# Patient Record
Sex: Male | Born: 1940 | Hispanic: No | Marital: Married | Smoking: Never smoker
Health system: Southern US, Community
[De-identification: ages and names within clinical notes are randomized; demographics above are authoritative.]

## PROBLEM LIST (undated history)

## (undated) DIAGNOSIS — S22080S Wedge compression fracture of T11-T12 vertebra, sequela: Secondary | ICD-10-CM

## (undated) DIAGNOSIS — D696 Thrombocytopenia, unspecified: Secondary | ICD-10-CM

## (undated) DIAGNOSIS — K439 Ventral hernia without obstruction or gangrene: Secondary | ICD-10-CM

## (undated) DIAGNOSIS — T7840XA Allergy, unspecified, initial encounter: Secondary | ICD-10-CM

## (undated) DIAGNOSIS — N179 Acute kidney failure, unspecified: Secondary | ICD-10-CM

## (undated) DIAGNOSIS — G894 Chronic pain syndrome: Secondary | ICD-10-CM

## (undated) DIAGNOSIS — G4730 Sleep apnea, unspecified: Secondary | ICD-10-CM

## (undated) DIAGNOSIS — K4090 Unilateral inguinal hernia, without obstruction or gangrene, not specified as recurrent: Secondary | ICD-10-CM

## (undated) DIAGNOSIS — N529 Male erectile dysfunction, unspecified: Secondary | ICD-10-CM

## (undated) DIAGNOSIS — M51369 Other intervertebral disc degeneration, lumbar region without mention of lumbar back pain or lower extremity pain: Secondary | ICD-10-CM

## (undated) DIAGNOSIS — I10 Essential (primary) hypertension: Secondary | ICD-10-CM

## (undated) DIAGNOSIS — E785 Hyperlipidemia, unspecified: Secondary | ICD-10-CM

## (undated) DIAGNOSIS — C679 Malignant neoplasm of bladder, unspecified: Secondary | ICD-10-CM

## (undated) DIAGNOSIS — C9000 Multiple myeloma not having achieved remission: Secondary | ICD-10-CM

## (undated) DIAGNOSIS — M1990 Unspecified osteoarthritis, unspecified site: Secondary | ICD-10-CM

## (undated) DIAGNOSIS — R972 Elevated prostate specific antigen [PSA]: Secondary | ICD-10-CM

## (undated) DIAGNOSIS — K219 Gastro-esophageal reflux disease without esophagitis: Secondary | ICD-10-CM

## (undated) DIAGNOSIS — R519 Headache, unspecified: Secondary | ICD-10-CM

## (undated) DIAGNOSIS — M5136 Other intervertebral disc degeneration, lumbar region: Secondary | ICD-10-CM

## (undated) HISTORY — DX: Unspecified osteoarthritis, unspecified site: M19.90

## (undated) HISTORY — DX: Hyperlipidemia, unspecified: E78.5

## (undated) HISTORY — DX: Essential (primary) hypertension: I10

## (undated) HISTORY — DX: Allergy, unspecified, initial encounter: T78.40XA

---

## 2016-07-07 HISTORY — PX: OTHER SURGICAL HISTORY: SHX169

## 2017-02-24 ENCOUNTER — Ambulatory Visit: Payer: No Typology Code available for payment source | Admitting: Podiatry

## 2017-06-23 ENCOUNTER — Ambulatory Visit (INDEPENDENT_AMBULATORY_CARE_PROVIDER_SITE_OTHER): Payer: Non-veteran care

## 2017-06-23 ENCOUNTER — Other Ambulatory Visit: Payer: Self-pay | Admitting: Podiatry

## 2017-06-23 ENCOUNTER — Encounter: Payer: Self-pay | Admitting: Podiatry

## 2017-06-23 ENCOUNTER — Ambulatory Visit (INDEPENDENT_AMBULATORY_CARE_PROVIDER_SITE_OTHER): Payer: Non-veteran care | Admitting: Podiatry

## 2017-06-23 DIAGNOSIS — M659 Synovitis and tenosynovitis, unspecified: Secondary | ICD-10-CM

## 2017-06-23 DIAGNOSIS — M7752 Other enthesopathy of left foot: Secondary | ICD-10-CM

## 2017-06-23 DIAGNOSIS — M7750 Other enthesopathy of unspecified foot: Secondary | ICD-10-CM

## 2017-06-23 DIAGNOSIS — L989 Disorder of the skin and subcutaneous tissue, unspecified: Secondary | ICD-10-CM | POA: Diagnosis not present

## 2017-06-23 DIAGNOSIS — M65971 Unspecified synovitis and tenosynovitis, right ankle and foot: Secondary | ICD-10-CM

## 2017-06-23 DIAGNOSIS — M65972 Unspecified synovitis and tenosynovitis, left ankle and foot: Secondary | ICD-10-CM

## 2017-06-23 NOTE — Progress Notes (Signed)
   Subjective:    Patient ID: Peter Webster, male    DOB: October 05, 1940, 76 y.o.   MRN: [id number]  HPI    Review of Systems     Objective:   Physical Exam        Assessment & Plan:

## 2017-06-25 NOTE — Progress Notes (Signed)
   Subjective: 76 year old male presents to the office today as a new patient with a chief complaint of aching pain in bilateral ankles that has been ongoing for several years. He reports associated throbbing pain in the right foot and painful callus lesions of the left foot. Walking increases the symptoms. He has had the callus trimmed and has worn corn pads for treatment. Patient presents today for further treatment and evaluation.   No past medical history on file.   Objective:  Physical Exam General: Alert and oriented x3 in no acute distress  Dermatology: Hyperkeratotic lesion present on the sub-fifth MPJ of the left foot. Pain on palpation with a central nucleated core noted.  Skin is warm, dry and supple bilateral lower extremities. Negative for open lesions or macerations.  Vascular: Palpable pedal pulses bilaterally. No edema or erythema noted. Capillary refill within normal limits.  Neurological: Epicritic and protective threshold grossly intact bilaterally.   Musculoskeletal Exam: Pain on palpation at the keratotic lesion noted as well as to the left 5th MPJ and to the anterior, medial and lateral aspects of bilateral ankle joints. Range of motion within normal limits bilateral. Muscle strength 5/5 in all groups bilateral.  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  #3 Dressed area with light dressing. #4 Injection of 0.5 mLs Celestone Soluspan injected into bilateral ankle joints. #5 Injection of 0.5 mLs Celestone Soluspan injected into the left 5th MPJ. #6 Patient is to return to the clinic in 6 weeks.   Edrick Kins, DPM [facility]  Dr. Edrick Kins, [place]                                        [place], [place] [address]                Office [phone]  Fax [phone]

## 2017-07-07 HISTORY — PX: COLONOSCOPY: SHX174

## 2017-08-04 ENCOUNTER — Ambulatory Visit: Payer: No Typology Code available for payment source | Admitting: Podiatry

## 2017-08-07 ENCOUNTER — Ambulatory Visit: Payer: No Typology Code available for payment source | Admitting: Podiatry

## 2017-09-01 ENCOUNTER — Ambulatory Visit (INDEPENDENT_AMBULATORY_CARE_PROVIDER_SITE_OTHER): Payer: No Typology Code available for payment source | Admitting: Podiatry

## 2017-09-01 ENCOUNTER — Encounter: Payer: Self-pay | Admitting: Podiatry

## 2017-09-01 DIAGNOSIS — L989 Disorder of the skin and subcutaneous tissue, unspecified: Secondary | ICD-10-CM

## 2017-09-01 DIAGNOSIS — M659 Synovitis and tenosynovitis, unspecified: Secondary | ICD-10-CM | POA: Diagnosis not present

## 2017-09-01 DIAGNOSIS — M7752 Other enthesopathy of left foot: Secondary | ICD-10-CM

## 2017-09-03 NOTE — Progress Notes (Signed)
   Subjective: 77 year old male presents to the office today for follow up evaluation of bilateral ankle pain, a painful callus lesion to the left sub-5th MPJ as well as capsulitis to the left 5th MPJ.  He states the pain has improved but is still present, returning about one week ago. He has been taking Tylenol with minimal relief. Patient presents today for further treatment and evaluation.   No past medical history on file.   Objective:  Physical Exam General: Alert and oriented x3 in no acute distress  Dermatology: Hyperkeratotic lesion present on the sub-fifth MPJ of the left foot. Pain on palpation with a central nucleated core noted.  Skin is warm, dry and supple bilateral lower extremities. Negative for open lesions or macerations.  Vascular: Palpable pedal pulses bilaterally. No edema or erythema noted. Capillary refill within normal limits.  Neurological: Epicritic and protective threshold grossly intact bilaterally.   Musculoskeletal Exam: Pain on palpation at the keratotic lesion noted as well as to the left 5th MPJ and to the anterior, medial and lateral aspects of bilateral ankle joints. Range of motion within normal limits bilateral. Muscle strength 5/5 in all groups bilateral.  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  #3 Dressed area with light dressing. #4 Injection of 0.5 mLs Celestone Soluspan injected into bilateral ankle joints. #5 Injection of 0.5 mLs Celestone Soluspan injected into the left 5th MPJ. #6 Patient is to return to the clinic when necessary.   Edrick Kins, DPM [facility]  Dr. Edrick Kins, [place]                                        [place], [place] [address]                Office [phone]  Fax  [phone]

## 2018-03-12 ENCOUNTER — Encounter: Payer: Self-pay | Admitting: Podiatry

## 2018-03-12 ENCOUNTER — Ambulatory Visit (INDEPENDENT_AMBULATORY_CARE_PROVIDER_SITE_OTHER): Payer: Non-veteran care | Admitting: Podiatry

## 2018-03-12 DIAGNOSIS — L989 Disorder of the skin and subcutaneous tissue, unspecified: Secondary | ICD-10-CM

## 2018-03-12 DIAGNOSIS — M779 Enthesopathy, unspecified: Secondary | ICD-10-CM

## 2018-03-12 DIAGNOSIS — M7752 Other enthesopathy of left foot: Secondary | ICD-10-CM

## 2018-03-12 DIAGNOSIS — M659 Synovitis and tenosynovitis, unspecified: Secondary | ICD-10-CM

## 2018-03-16 NOTE — Progress Notes (Signed)
   Subjective: 77 year old male presents to the office today for follow up evaluation of bilateral ankle pain, a painful callus lesion to the left sub-5th MPJ as well as capsulitis to the left 5th MPJ. He reports continued pain in the ankles and states the callus needs to be trimmed down again. He has been taking Meloxicam prescribed by his PCP. Walking increases the symptoms. Patient is here for further evaluation and treatment.   No past medical history on file.   Objective:  Physical Exam General: Alert and oriented x3 in no acute distress  Dermatology: Hyperkeratotic lesion present on the sub-fifth MPJ of the left foot. Pain on palpation with a central nucleated core noted.  Skin is warm, dry and supple bilateral lower extremities. Negative for open lesions or macerations.  Vascular: Palpable pedal pulses bilaterally. No edema or erythema noted. Capillary refill within normal limits.  Neurological: Epicritic and protective threshold grossly intact bilaterally.   Musculoskeletal Exam: Pain on palpation at the keratotic lesion noted as well as to the left 5th MPJ and to the anterior, medial and lateral aspects of bilateral ankle joints. Range of motion within normal limits bilateral. Muscle strength 5/5 in all groups bilateral.  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  #3 Dressed area with light dressing. #4 Injection of 0.5 mLs Celestone Soluspan injected into bilateral ankle joints. #5 Injection of 0.5 mLs Celestone Soluspan injected into the left 5th MPJ. #6 Continue taking Meloxicam daily prescribed by PCP.  #7 Patient is to return to the clinic when necessary.   Edrick Kins, DPM [facility]  Dr. Edrick Kins, [place]                                         [place], [place] [address]                Office [phone]  Fax [phone]

## 2018-05-03 ENCOUNTER — Encounter

## 2018-05-04 ENCOUNTER — Ambulatory Visit: Payer: Non-veteran care | Admitting: Podiatry

## 2018-05-14 ENCOUNTER — Encounter: Payer: Self-pay | Admitting: Podiatry

## 2018-05-14 ENCOUNTER — Ambulatory Visit (INDEPENDENT_AMBULATORY_CARE_PROVIDER_SITE_OTHER): Payer: Non-veteran care | Admitting: Podiatry

## 2018-05-14 DIAGNOSIS — L989 Disorder of the skin and subcutaneous tissue, unspecified: Secondary | ICD-10-CM

## 2018-05-14 DIAGNOSIS — M659 Synovitis and tenosynovitis, unspecified: Secondary | ICD-10-CM

## 2018-05-14 NOTE — Patient Instructions (Addendum)
Recommend Corn & Callus remover.   Active ingredient : Salicylic acid

## 2018-05-16 NOTE — Progress Notes (Signed)
   Subjective: 77 year old male presents to the office today for follow up evaluation of bilateral ankle pain, a painful callus lesion to the left sub-5th MPJ as well as capsulitis to the left 5th MPJ. He reports the ankle pain is still present and the left is greater than the right. He states the injections provided some temporary relief. Patient is here for further evaluation and treatment.   No past medical history on file.   Objective:  Physical Exam General: Alert and oriented x3 in no acute distress  Dermatology: Hyperkeratotic lesion present on the sub-fifth MPJ of the left foot. Pain on palpation with a central nucleated core noted.  Skin is warm, dry and supple bilateral lower extremities. Negative for open lesions or macerations.  Vascular: Palpable pedal pulses bilaterally. No edema or erythema noted. Capillary refill within normal limits.  Neurological: Epicritic and protective threshold grossly intact bilaterally.   Musculoskeletal Exam: Pain on palpation at the keratotic lesion noted and to the anterior, medial and lateral aspects of bilateral ankle joints. Range of motion within normal limits bilateral. Muscle strength 5/5 in all groups bilateral.  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 Salinocaine applied to area.  #3 Dressed area with light dressing. #4 Injection of 0.5 mLs Celestone Soluspan injected into bilateral ankle joints. #5 Recommended OTC corn and callus remover.  #6 Return to clinic as needed.   Edrick Kins, DPM [facility]  Dr. Edrick Kins, [place]                                        [place], [place] [address]                Office [phone]  Fax [phone]

## 2018-06-15 ENCOUNTER — Encounter: Payer: Self-pay | Admitting: Podiatry

## 2018-06-15 ENCOUNTER — Ambulatory Visit (INDEPENDENT_AMBULATORY_CARE_PROVIDER_SITE_OTHER): Payer: No Typology Code available for payment source | Admitting: Podiatry

## 2018-06-15 DIAGNOSIS — B351 Tinea unguium: Secondary | ICD-10-CM | POA: Diagnosis not present

## 2018-06-15 DIAGNOSIS — M79676 Pain in unspecified toe(s): Secondary | ICD-10-CM | POA: Diagnosis not present

## 2018-06-15 DIAGNOSIS — M659 Synovitis and tenosynovitis, unspecified: Secondary | ICD-10-CM

## 2018-06-15 DIAGNOSIS — L989 Disorder of the skin and subcutaneous tissue, unspecified: Secondary | ICD-10-CM

## 2018-06-16 NOTE — Progress Notes (Signed)
   Subjective: 77 year old male presents to the office today for follow up evaluation of bilateral ankle pain and a painful callus lesion of the sub-fifth MPJ of the left foot. He states the callus needs to be trimmed and he is still experiencing some ankle pain. Walking and bearing weight increases the pain.  He also complains of elongated, thickened nails bilaterally that cause pain while ambulating in shoes. He is unable to trim his own nails. Patient is here for further evaluation and treatment.   No past medical history on file.   Objective:  Physical Exam General: Alert and oriented x3 in no acute distress  Dermatology: Hyperkeratotic lesion present on the sub-fifth MPJ of the left foot. Pain on palpation with a central nucleated core noted. Nails are tender, long, thickened and dystrophic with subungual debris, consistent with onychomycosis, 1-5 bilateral. No signs of infection noted. Skin is warm, dry and supple bilateral lower extremities. Negative for open lesions or macerations.  Vascular: Palpable pedal pulses bilaterally. No edema or erythema noted. Capillary refill within normal limits.  Neurological: Epicritic and protective threshold grossly intact bilaterally.   Musculoskeletal Exam: Pain on palpation at the keratotic lesion noted and to the anterior, medial and lateral aspects of bilateral ankle joints. Range of motion within normal limits bilateral. Muscle strength 5/5 in all groups bilateral.  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 Salinocaine applied to area.  #3 Dressed area with light dressing. #4 Injection of 0.5 mLs Celestone Soluspan injected into bilateral  ankle joints. #5 Mechanical debridement of nails 1-5 bilaterally performed using a nail nipper. Filed with dremel without incident.  #6 Return to clinic in 3 months.    Edrick Kins, DPM [facility]  Dr. Edrick Kins, [place]                                        [place], [place] [address]                Office [phone]  Fax [phone]

## 2018-07-01 ENCOUNTER — Telehealth: Payer: Self-pay | Admitting: Podiatry

## 2018-07-02 NOTE — Telephone Encounter (Signed)
[facility] called requesting notes for podiatry appointment. Please contact us at  [phone].

## 2018-08-17 ENCOUNTER — Ambulatory Visit (INDEPENDENT_AMBULATORY_CARE_PROVIDER_SITE_OTHER): Payer: No Typology Code available for payment source | Admitting: Podiatry

## 2018-08-17 ENCOUNTER — Encounter: Payer: Self-pay | Admitting: Podiatry

## 2018-08-17 DIAGNOSIS — M659 Synovitis and tenosynovitis, unspecified: Secondary | ICD-10-CM

## 2018-08-17 DIAGNOSIS — M65972 Unspecified synovitis and tenosynovitis, left ankle and foot: Secondary | ICD-10-CM

## 2018-08-17 DIAGNOSIS — B351 Tinea unguium: Secondary | ICD-10-CM

## 2018-08-17 DIAGNOSIS — L989 Disorder of the skin and subcutaneous tissue, unspecified: Secondary | ICD-10-CM | POA: Diagnosis not present

## 2018-08-17 DIAGNOSIS — M65971 Unspecified synovitis and tenosynovitis, right ankle and foot: Secondary | ICD-10-CM

## 2018-08-17 DIAGNOSIS — M79676 Pain in unspecified toe(s): Secondary | ICD-10-CM

## 2018-08-23 NOTE — Progress Notes (Signed)
   Subjective: 78 year old male presents to the office today for follow up evaluation of bilateral ankle pain and a painful callus lesion of the sub-fifth MPJ of the left foot. He states the callus needs to be trimmed and he is still experiencing some ankle pain. Walking and bearing weight increases the pain.  He also complains of elongated, thickened nails bilaterally that cause pain while ambulating in shoes. He is unable to trim his own nails. Patient is here for further evaluation and treatment.   History reviewed. No pertinent past medical history.   Objective:  Physical Exam General: Alert and oriented x3 in no acute distress  Dermatology: Hyperkeratotic lesion present on the sub-fifth MPJ of the left foot. Pain on palpation with a central nucleated core noted. Nails are tender, long, thickened and dystrophic with subungual debris, consistent with onychomycosis, 1-5 bilateral. No signs of infection noted. Skin is warm, dry and supple bilateral lower extremities. Negative for open lesions or macerations.  Vascular: Palpable pedal pulses bilaterally. No edema or erythema noted. Capillary refill within normal limits.  Neurological: Epicritic and protective threshold grossly intact bilaterally.   Musculoskeletal Exam: Pain on palpation at the keratotic lesion noted and to the anterior, medial and lateral aspects of bilateral ankle joints. Range of motion within normal limits bilateral. Muscle strength 5/5 in all groups bilateral.  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 Salinocaine applied to area.  #3 Dressed area with light dressing. #4 Injection of 0.5 mLs Celestone Soluspan  injected into bilateral ankle joints. #5 Mechanical debridement of nails 1-5 bilaterally performed using a nail nipper. Filed with dremel without incident.  #6 Return to clinic in 3 months.    Edrick Kins, DPM [facility]  Dr. Edrick Kins, [place]                                        [place], [place] [address]                Office [phone]  Fax [phone]

## 2018-11-19 ENCOUNTER — Other Ambulatory Visit: Payer: Self-pay

## 2018-11-19 ENCOUNTER — Ambulatory Visit (INDEPENDENT_AMBULATORY_CARE_PROVIDER_SITE_OTHER): Payer: No Typology Code available for payment source | Admitting: Podiatry

## 2018-11-19 ENCOUNTER — Encounter: Payer: Self-pay | Admitting: Podiatry

## 2018-11-19 VITALS — Temp 97.7°F

## 2018-11-19 DIAGNOSIS — L989 Disorder of the skin and subcutaneous tissue, unspecified: Secondary | ICD-10-CM

## 2018-11-19 DIAGNOSIS — B351 Tinea unguium: Secondary | ICD-10-CM | POA: Diagnosis not present

## 2018-11-19 DIAGNOSIS — M79676 Pain in unspecified toe(s): Secondary | ICD-10-CM | POA: Diagnosis not present

## 2018-11-19 DIAGNOSIS — M65171 Other infective (teno)synovitis, right ankle and foot: Secondary | ICD-10-CM | POA: Diagnosis not present

## 2018-11-19 DIAGNOSIS — M65972 Unspecified synovitis and tenosynovitis, left ankle and foot: Secondary | ICD-10-CM

## 2018-11-19 DIAGNOSIS — M65172 Other infective (teno)synovitis, left ankle and foot: Secondary | ICD-10-CM

## 2018-11-19 DIAGNOSIS — M659 Synovitis and tenosynovitis, unspecified: Secondary | ICD-10-CM

## 2018-11-19 DIAGNOSIS — M65971 Unspecified synovitis and tenosynovitis, right ankle and foot: Secondary | ICD-10-CM

## 2018-11-22 NOTE — Progress Notes (Signed)
   Subjective: 78 year old male presents to the office today for follow up evaluation of bilateral ankle pain and a painful callus lesion of the sub-fifth MPJ of the left foot. He states the callus needs to be trimmed and he is still experiencing some ankle pain. Walking and bearing weight increases the pain. He is requesting injections for treatment since that seemed to provided some relief in the past.  He also complains of elongated, thickened nails bilaterally that cause pain while ambulating in shoes. He is unable to trim his own nails. Patient is here for further evaluation and treatment.   No past medical history on file.   Objective:  Physical Exam General: Alert and oriented x3 in no acute distress  Dermatology: Hyperkeratotic lesion present on the sub-fifth MPJ of the left foot. Pain on palpation with a central nucleated core noted. Nails are tender, long, thickened and dystrophic with subungual debris, consistent with onychomycosis, 1-5 bilateral. No signs of infection noted. Skin is warm, dry and supple bilateral lower extremities. Negative for open lesions or macerations.  Vascular: Palpable pedal pulses bilaterally. No edema or erythema noted. Capillary refill within normal limits.  Neurological: Epicritic and protective threshold grossly intact bilaterally.   Musculoskeletal Exam: Pain on palpation at the keratotic lesion noted and to the anterior, medial and lateral aspects of bilateral ankle joints. Range of motion within normal limits bilateral. Muscle strength 5/5 in all groups bilateral.  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   #3 Dressed area with light dressing.  #4 Injection of 0.5 mLs Celestone Soluspan injected into bilateral ankle joints. #5 Mechanical debridement of nails 1-5 bilaterally performed using a nail nipper. Filed with dremel without incident.  #6 Prescription for custom orthotics provided to patient to take to [place].  #7 Return to clinic in 3 months.    Edrick Kins, DPM [facility]  Dr. Edrick Kins, [place]                                        [place], [place] [address]                Office [phone]  Fax [phone]

## 2019-01-20 ENCOUNTER — Other Ambulatory Visit: Payer: Self-pay | Admitting: *Deleted

## 2019-01-20 DIAGNOSIS — Z20822 Contact with and (suspected) exposure to covid-19: Secondary | ICD-10-CM

## 2019-01-21 ENCOUNTER — Ambulatory Visit: Payer: No Typology Code available for payment source | Admitting: Podiatry

## 2019-01-25 LAB — NOVEL CORONAVIRUS, NAA: SARS-CoV-2, NAA: NOT DETECTED

## 2019-02-11 ENCOUNTER — Other Ambulatory Visit: Payer: Self-pay | Admitting: Podiatry

## 2019-02-11 ENCOUNTER — Other Ambulatory Visit: Payer: Self-pay

## 2019-02-11 ENCOUNTER — Ambulatory Visit (INDEPENDENT_AMBULATORY_CARE_PROVIDER_SITE_OTHER): Payer: No Typology Code available for payment source | Admitting: Podiatry

## 2019-02-11 ENCOUNTER — Ambulatory Visit (INDEPENDENT_AMBULATORY_CARE_PROVIDER_SITE_OTHER): Payer: No Typology Code available for payment source

## 2019-02-11 ENCOUNTER — Encounter: Payer: Self-pay | Admitting: Podiatry

## 2019-02-11 VITALS — Temp 98.4°F

## 2019-02-11 DIAGNOSIS — L989 Disorder of the skin and subcutaneous tissue, unspecified: Secondary | ICD-10-CM | POA: Diagnosis not present

## 2019-02-11 DIAGNOSIS — M659 Synovitis and tenosynovitis, unspecified: Secondary | ICD-10-CM | POA: Diagnosis not present

## 2019-02-11 DIAGNOSIS — M21622 Bunionette of left foot: Secondary | ICD-10-CM

## 2019-02-11 DIAGNOSIS — M79676 Pain in unspecified toe(s): Secondary | ICD-10-CM

## 2019-02-11 DIAGNOSIS — B351 Tinea unguium: Secondary | ICD-10-CM | POA: Diagnosis not present

## 2019-02-13 NOTE — Progress Notes (Signed)
   Subjective: 78 year old male presents to the office today for follow up evaluation of bilateral ankle pain and a painful callus lesion of the sub-fifth MPJ of the left foot. He states the callus needs to be trimmed and he is still experiencing some ankle pain. Walking and bearing weight increases the pain. He states the injections helped to alleviate his pain so he would like them again.   He also complains of elongated, thickened nails bilaterally that cause pain while ambulating in shoes. He is unable to trim his own nails. Patient is here for further evaluation and treatment.   No past medical history on file.   Objective:  Physical Exam General: Alert and oriented x3 in no acute distress  Dermatology: Hyperkeratotic lesion present on the sub-fifth MPJ of the left foot. Pain on palpation with a central nucleated core noted. Nails are tender, long, thickened and dystrophic with subungual debris, consistent with onychomycosis, 1-5 bilateral. No signs of infection noted. Skin is warm, dry and supple bilateral lower extremities. Negative for open lesions or macerations.  Vascular: Palpable pedal pulses bilaterally. No edema or erythema noted. Capillary refill within normal limits.  Neurological: Epicritic and protective threshold grossly intact bilaterally.   Musculoskeletal Exam: Pain on palpation at the keratotic lesion noted and to the anterior, medial and lateral aspects of bilateral ankle joints.  Clinical evidence of Tailor's bunion deformity noted to the respective foot. There is a moderate pain on palpation range of motion of the fifth MPJ. Range of motion within normal limits bilateral. Muscle strength 5/5 in all groups bilateral.  Radiographic Exam: Increased intermetatarsal angle to the fourth interspace of the respective foot. Prominent fifth metatarsal head. Joint spaces preserved.   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 X-Rays reviewed.  #2 Excisional debridement of keratoic lesion using a chisel blade was performed without incident.   #3 Dressed area with light dressing. #4 Injection of 0.5 mLs Celestone Soluspan injected into bilateral ankle joints. #5 Mechanical debridement of nails 1-5 bilaterally performed using a nail nipper. Filed with dremel without incident.  #6 Today we discussed the conservative versus surgical management of the presenting pathology. The patient opts for surgical management. All possible complications and details of the procedure were explained. All patient questions were answered. No guarantees were expressed or implied. #7 Authorization for surgery was initiated today. Surgery will consist of Tailor's bunionectomy with osteotomy.  #8 Return to clinic one week post op.     Edrick Kins, DPM [facility]  Dr. Edrick Kins, [place]                                        [place], [place] [address]                Office [phone]  Fax [phone]

## 2019-02-14 ENCOUNTER — Telehealth: Payer: Self-pay | Admitting: *Deleted

## 2019-02-14 ENCOUNTER — Encounter: Payer: Self-pay | Admitting: Podiatry

## 2019-02-14 NOTE — Telephone Encounter (Signed)
"  I had a consult with Peter Webster a couple of days ago.  I was supposed to have a surgery done with Dr. Amalia Hailey.  I am calling to set up an appointment for next month."

## 2019-02-15 NOTE — Telephone Encounter (Signed)
I attempted to call the patient.  I left him a message to call me back on tomorrow.

## 2019-02-16 NOTE — Telephone Encounter (Signed)
I'm returning your call.  You want to schedule your surgery with Dr. Amalia Hailey?  "Yes, I'd like to schedule it for September 24."  Dr. Amalia Hailey doesn't have anything available on that date.  His next available date is April 14, 2019.  "Okay, put me down for then.  What time?"  Someone from the surgical center will call you a day or two prior to your surgery date and will give you your arrival time.

## 2019-02-21 ENCOUNTER — Telehealth: Payer: Self-pay | Admitting: *Deleted

## 2019-02-21 NOTE — Telephone Encounter (Signed)
"  I spoke to you last week about scheduling my appointment for surgery in October.  I want to see if I can reschedule it to November."  Dr. Amalia Hailey can do it on May 12, 2019.  "Perfect, that date will be fine."  I'll get it rescheduled.

## 2019-04-01 ENCOUNTER — Ambulatory Visit (INDEPENDENT_AMBULATORY_CARE_PROVIDER_SITE_OTHER): Payer: No Typology Code available for payment source | Admitting: Podiatry

## 2019-04-01 ENCOUNTER — Other Ambulatory Visit: Payer: Self-pay

## 2019-04-01 DIAGNOSIS — M65972 Unspecified synovitis and tenosynovitis, left ankle and foot: Secondary | ICD-10-CM

## 2019-04-01 DIAGNOSIS — B351 Tinea unguium: Secondary | ICD-10-CM | POA: Diagnosis not present

## 2019-04-01 DIAGNOSIS — M21622 Bunionette of left foot: Secondary | ICD-10-CM | POA: Diagnosis not present

## 2019-04-01 DIAGNOSIS — L989 Disorder of the skin and subcutaneous tissue, unspecified: Secondary | ICD-10-CM | POA: Diagnosis not present

## 2019-04-01 DIAGNOSIS — M659 Synovitis and tenosynovitis, unspecified: Secondary | ICD-10-CM | POA: Diagnosis not present

## 2019-04-01 DIAGNOSIS — M79676 Pain in unspecified toe(s): Secondary | ICD-10-CM

## 2019-04-01 DIAGNOSIS — M65971 Unspecified synovitis and tenosynovitis, right ankle and foot: Secondary | ICD-10-CM

## 2019-04-04 NOTE — Progress Notes (Signed)
   Subjective: 78 year old male presents to the office today for follow up evaluation of bilateral ankle pain, a painful callus lesion of the sub-fifth MPJ and a Tailor's bunion of the left foot. He reports continued pain, especially in the left foot. He is interested in surgical intervention of the Tailor's bunion. He also reports continued pain secondary to the callus lesion of the left foot. Walking and wearing shoes increases his pain. He states the injections helped alleviate the pain in his ankles. Patient is here for further evaluation and treatment.   No past medical history on file.   Objective:  Physical Exam General: Alert and oriented x3 in no acute distress  Dermatology: Hyperkeratotic lesion present on the sub-fifth MPJ of the left foot. Pain on palpation with a central nucleated core noted. Skin is warm, dry and supple bilateral lower extremities. Negative for open lesions or macerations.  Vascular: Palpable pedal pulses bilaterally. No edema or erythema noted. Capillary refill within normal limits.  Neurological: Epicritic and protective threshold grossly intact bilaterally.   Musculoskeletal Exam: Pain on palpation at the keratotic lesion noted and to the anterior, medial and lateral aspects of bilateral ankle joints.  Clinical evidence of Tailor's bunion deformity noted to the respective foot. There is a moderate pain on palpation range of motion of the fifth MPJ. Range of motion within normal limits bilateral. Muscle strength 5/5 in all groups bilateral.  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 #2 Excisional debridement of keratoic lesion using a chisel blade was performed without incident.   #3 Dressed area with light dressing. #4 Injection of 0.5 mLs Celestone Soluspan injected into bilateral ankle joints. #5 Surgery for Tailor's bunion scheduled for  November.  #6 Return to clinic one week post op.     Edrick Kins, DPM [facility]  Dr. Edrick Kins, [place]                                        [place], [place] [address]                Office [phone]  Fax [phone]

## 2019-04-21 ENCOUNTER — Telehealth: Payer: Self-pay | Admitting: *Deleted

## 2019-04-21 NOTE — Telephone Encounter (Signed)
I attempted to call the patient again.  I left another message for him to give me a call.

## 2019-04-21 NOTE — Telephone Encounter (Addendum)
I attempted to call the patient to inform him that his referral expired on April 06, 2019.  I also informed him that we may have to reschedule his surgery, 05/12/2019, because we do not have authorization for his surgery.  The procedure code is [phone] and it is not on the referral.  I informed him that he needs to contact his primary care physician (PCP) and see if he/she will authorize his surgery.  I asked him to give me a call back.

## 2019-04-22 NOTE — Telephone Encounter (Signed)
"  I am returning your call."  We need an updated referral for you.  The one we have is expired and it doesn't have the surgical code on it.  The cpt code is [phone].  It must be included on the referral.  "I just spoke to them and they said everything was okay.  They asked me some questions and they said that was all they needed."  I'll see if we have an updated referral and I'll let you know.   I called and left a message informing Peter Webster that we do not have an updated referral.  The one we have is expired and it does not include the surgical cpt code which is [phone].

## 2019-04-29 NOTE — Telephone Encounter (Signed)
"  I'm calling to see if you got the okay for my surgery."  I have not seen anything come in from the [place].  Did you contact them?  "No, I have not done anything.  They informed me that the patient must contact his Primary Care doctor in order to get any updates to his (her) referral.  "Let me call them and see what I can do."  Do you still have the procedure code I gave you?  "No, what is it?"  Your procedure code is [phone], that code must be on the referral.  "I'll give them a call."

## 2019-05-04 NOTE — Telephone Encounter (Signed)
I just got off the phone with the [place].  They said that my surgery was already authorized on my referral."  The referral we have does not have the surgery cpt code of 528[address] on it.  "Well is there anyway you can fax them that information so we can proceed with my surgery next week.?  I can give you the fax number."  Yes, I can send them the information.  What's the fax number?  "It is [phone] and his name is Dr. Lucille Passy."  I'll fax the information to Dr. Waldemar Dickens.   As requested by the patient, I faxed the needed information to Dr. Waldemar Dickens.

## 2019-05-10 NOTE — Telephone Encounter (Signed)
I am calling to let you know that we have not heard anything from the [place] about the authorization of your surgery.  "Well I talked to them and they said they have not received any clinical notes from you all since I've been coming there.  That's a hold up right there."  I sent them some information the last time that I talked to you.  "Well hopefully we'll hear something soon.  I'll follow up on it."  I faxed the clinicals to Dr. Waldemar Dickens.

## 2019-05-11 NOTE — Telephone Encounter (Signed)
"  I assume you haven't heard anything from the [place], so my surgery has been canceled."  Yes, that is correct.  We must cancel your surgery for tomorrow.  "Okay, we'll see how it plays out.  Hopefully they will respond soon."

## 2019-05-17 ENCOUNTER — Telehealth: Payer: Self-pay | Admitting: *Deleted

## 2019-05-17 NOTE — Telephone Encounter (Signed)
"  This is the [facility] regarding a CPT code question for [employer].  If you could, call back."  I attempted to call him back.  He didn't leave his name.  I left a message requesting a return call.

## 2019-05-18 NOTE — Telephone Encounter (Signed)
I attempted to call the [place] again.  I left a message for a return call.

## 2019-06-21 ENCOUNTER — Encounter: Payer: Self-pay | Admitting: Podiatry

## 2019-06-21 ENCOUNTER — Other Ambulatory Visit: Payer: Self-pay

## 2019-06-21 ENCOUNTER — Ambulatory Visit (INDEPENDENT_AMBULATORY_CARE_PROVIDER_SITE_OTHER): Payer: No Typology Code available for payment source | Admitting: Podiatry

## 2019-06-21 DIAGNOSIS — B351 Tinea unguium: Secondary | ICD-10-CM

## 2019-06-21 DIAGNOSIS — M79676 Pain in unspecified toe(s): Secondary | ICD-10-CM

## 2019-06-21 DIAGNOSIS — L989 Disorder of the skin and subcutaneous tissue, unspecified: Secondary | ICD-10-CM | POA: Diagnosis not present

## 2019-06-21 DIAGNOSIS — M659 Synovitis and tenosynovitis, unspecified: Secondary | ICD-10-CM

## 2019-06-24 NOTE — Progress Notes (Signed)
   Subjective: 78 year old male presents to the office today for follow up evaluation of bilateral ankle pain, a painful callus lesion of the sub-fifth MPJ and a Tailor's bunion of the left foot. He reports continued pain of the callus lesion of the left foot. He also notes continued pain of the ankles and would like injections since they helped relieve the pain in the past. Being on his feet increases the pain. Patient is here for further evaluation and treatment.   No past medical history on file.   Objective:  Physical Exam General: Alert and oriented x3 in no acute distress  Dermatology: Hyperkeratotic lesion present on the sub-fifth MPJ of the left foot. Pain on palpation with a central nucleated core noted. Skin is warm, dry and supple bilateral lower extremities. Negative for open lesions or macerations.  Vascular: Palpable pedal pulses bilaterally. No edema or erythema noted. Capillary refill within normal limits.  Neurological: Epicritic and protective threshold grossly intact bilaterally.   Musculoskeletal Exam: Pain on palpation at the keratotic lesion noted and to the anterior, medial and lateral aspects of bilateral ankle joints.  Clinical evidence of Tailor's bunion deformity noted to the respective foot. There is a moderate pain on palpation range of motion of the fifth MPJ. Range of motion within normal limits bilateral. Muscle strength 5/5 in all groups bilateral.  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 #2 Excisional debridement of keratoic lesion using a chisel blade was performed without incident.   #3 Dressed area with light dressing. #4 Injection of 0.5 mLs Celestone Soluspan injected into bilateral ankle joints. #5 Surgery for Tailor's bunion is pending [facility] approval.  #6 Return to clinic in three months.  Wife's birthday is [birth year] 10.      Edrick Kins, DPM [facility]  Dr. Edrick Kins, [place]                                        [place], [place] [address]                Office [phone]  Fax [phone]

## 2019-09-20 ENCOUNTER — Ambulatory Visit: Payer: No Typology Code available for payment source | Admitting: Podiatry

## 2019-09-27 ENCOUNTER — Ambulatory Visit (INDEPENDENT_AMBULATORY_CARE_PROVIDER_SITE_OTHER): Payer: No Typology Code available for payment source | Admitting: Podiatry

## 2019-09-27 ENCOUNTER — Other Ambulatory Visit: Payer: Self-pay

## 2019-09-27 ENCOUNTER — Encounter: Payer: Self-pay | Admitting: Podiatry

## 2019-09-27 VITALS — Temp 97.9°F

## 2019-09-27 DIAGNOSIS — B351 Tinea unguium: Secondary | ICD-10-CM

## 2019-09-27 DIAGNOSIS — L989 Disorder of the skin and subcutaneous tissue, unspecified: Secondary | ICD-10-CM

## 2019-09-27 DIAGNOSIS — M659 Synovitis and tenosynovitis, unspecified: Secondary | ICD-10-CM

## 2019-09-27 DIAGNOSIS — M79676 Pain in unspecified toe(s): Secondary | ICD-10-CM | POA: Diagnosis not present

## 2019-09-29 NOTE — Progress Notes (Signed)
   Subjective: 79 year old male presents to the office today for follow up evaluation of bilateral ankle pain, a painful callus lesion of the sub-fifth MPJ and a Tailor's bunion of the left foot. He reports continued pain and requests injections for treatment of ankle pain since that has helped in the past. He also complains of elongated, thickened nails that cause pain while ambulating in shoes. He is unable to trim his own nails. Patient is here for further evaluation and treatment.   No past medical history on file.   Objective:  Physical Exam General: Alert and oriented x3 in no acute distress  Dermatology: Hyperkeratotic lesion present on the sub-fifth MPJ of the left foot. Pain on palpation with a central nucleated core noted. Nails are tender, long, thickened and dystrophic with subungual debris, consistent with onychomycosis, 1-5 bilateral. No signs of infection noted. Skin is warm, dry and supple bilateral lower extremities. Negative for open lesions or macerations.  Vascular: Palpable pedal pulses bilaterally. No edema or erythema noted. Capillary refill within normal limits.  Neurological: Epicritic and protective threshold grossly intact bilaterally.   Musculoskeletal Exam: Pain on palpation at the keratotic lesion noted and to the anterior, medial and lateral aspects of bilateral ankle joints.  Clinical evidence of Tailor's bunion deformity noted to the respective foot. There is a moderate pain on palpation range of motion of the fifth MPJ. Range of motion within normal limits bilateral. Muscle strength 5/5 in all groups bilateral.  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 #2 Excisional debridement of keratoic lesion using a chisel blade was  performed without incident.   #3 Dressed area with light dressing. #4 Mechanical debridement of nails 1-5 bilaterally performed using a nail nipper. Filed with dremel without incident.  #5 patient has papers from [place] approving surgery. Surgery will consist of Tailor's bunionectomy with 5th metatarsal osteotomy left.  #6 Return to clinic one week post op.   Wife's birthday is [birth year].      Edrick Kins, DPM [facility]  Dr. Edrick Kins, [place]                                        [place], [place] [address]                Office [phone]  Fax [phone]

## 2019-10-18 ENCOUNTER — Ambulatory Visit (INDEPENDENT_AMBULATORY_CARE_PROVIDER_SITE_OTHER): Payer: No Typology Code available for payment source | Admitting: Podiatry

## 2019-10-18 ENCOUNTER — Encounter: Payer: Self-pay | Admitting: Podiatry

## 2019-10-18 ENCOUNTER — Other Ambulatory Visit: Payer: Self-pay

## 2019-10-18 VITALS — Temp 97.3°F

## 2019-10-18 DIAGNOSIS — M722 Plantar fascial fibromatosis: Secondary | ICD-10-CM | POA: Diagnosis not present

## 2019-10-18 DIAGNOSIS — L989 Disorder of the skin and subcutaneous tissue, unspecified: Secondary | ICD-10-CM

## 2019-10-18 DIAGNOSIS — M21622 Bunionette of left foot: Secondary | ICD-10-CM

## 2019-10-18 NOTE — Patient Instructions (Signed)
Pre-Operative Instructions  Congratulations, you have decided to take an important step towards improving your quality of life.  You can be assured that the doctors and staff at [facility] will be with you every step of the way.  Here are some important things you should know:  1. Plan to be at the surgery center/hospital at least 1 (one) hour prior to your scheduled time, unless otherwise directed by the surgical center/hospital staff.  You must have a responsible adult accompany you, remain during the surgery and drive you home.  Make sure you have directions to the surgical center/hospital to ensure you arrive on time. 2. If you are having surgery at [facility] or [facility] hospitals, you will need a copy of your medical history and physical form from your family physician within one month prior to the date of surgery. We will give you a form for your primary physician to complete.  3. We make every effort to accommodate the date you request for surgery.  However, there are times where surgery dates or times have to be moved.  We will contact you as soon as possible if a change in schedule is required.   4. No aspirin/ibuprofen for one week before surgery.  If you are on aspirin, any non-steroidal anti-inflammatory medications (Mobic, Aleve, Ibuprofen) should not be taken seven (7) days prior to your surgery.  You make take Tylenol for pain prior to surgery.  5. Medications - If you are taking daily heart and blood pressure medications, seizure, reflux, allergy, asthma, anxiety, pain or diabetes medications, make sure you notify the surgery center/hospital before the day of surgery so they can tell you which medications you should take or avoid the day of surgery. 6. No food or drink after midnight the night before surgery unless directed otherwise by surgical center/hospital staff. 7. No alcoholic beverages 24-hours prior to surgery.  No smoking 24-hours prior or 24-hours after  surgery. 8. Wear loose pants or shorts. They should be loose enough to fit over bandages, boots, and casts. 9. Don't wear slip-on shoes. Sneakers are preferred. 10. Bring your boot with you to the surgery center/hospital.  Also bring crutches or a walker if your physician has prescribed it for you.  If you do not have this equipment, it will be provided for you after surgery. 11. If you have not been contacted by the surgery center/hospital by the day before your surgery, call to confirm the date and time of your surgery. 12. Leave-time from work may vary depending on the type of surgery you have.  Appropriate arrangements should be made prior to surgery with your employer. 13. Prescriptions will be provided immediately following surgery by your doctor.  Fill these as soon as possible after surgery and take the medication as directed. Pain medications will not be refilled on weekends and must be approved by the doctor. 14. Remove nail polish on the operative foot and avoid getting pedicures prior to surgery. 15. Wash the night before surgery.  The night before surgery wash the foot and leg well with water and the antibacterial soap provided. Be sure to pay special attention to beneath the toenails and in between the toes.  Wash for at least three (3) minutes. Rinse thoroughly with water and dry well with a towel.  Perform this wash unless told not to do so by your physician.  Enclosed: 1 Ice pack (please put in freezer the night before surgery)   1 Hibiclens skin cleaner     Pre-op instructions  If you have any questions regarding the instructions, please do not hesitate to call our office.  [place]: [address], [place], [place] [address] -- [phone]  [place]: [address] [place], [place] [address] -- [phone]  [place]: [address], , [place] [address] -- [phone]   Website: [email or web address] 

## 2019-10-24 NOTE — Progress Notes (Signed)
   Subjective: 79 year old male presents to the office today for follow up evaluation of bilateral ankle pain, a painful callus lesion of the sub-fifth MPJ and a Tailor's bunion of the left foot. He now reports intermittent sharp pain to the left heel that began 3-4 weeks ago. He states the pain is worse when standing after being seated for a long period of time. Walking excessively also increases the pain. He has been taking Tylenol for treatment of all his pain. Patient is here for further evaluation and treatment.   No past medical history on file.   Objective:  Physical Exam General: Alert and oriented x3 in no acute distress  Dermatology: Hyperkeratotic lesion present on the sub-fifth MPJ of the left foot. Pain on palpation with a central nucleated core noted. Skin is warm, dry and supple bilateral lower extremities. Negative for open lesions or macerations.  Vascular: Palpable pedal pulses bilaterally. No edema or erythema noted. Capillary refill within normal limits.  Neurological: Epicritic and protective threshold grossly intact bilaterally.   Musculoskeletal Exam: Pain on palpation at the keratotic lesion noted and to the anterior, medial and lateral aspects of bilateral ankle joints.  Clinical evidence of Tailor's bunion deformity noted to the respective foot. There is a moderate pain on palpation range of motion of the fifth MPJ. Pain with palpation noted to the left heel along the plantar fascia.  Range of motion within normal limits bilateral. Muscle strength 5/5 in all groups bilateral.  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 #2 Excisional debridement of keratoic lesion using a chisel blade was performed without incident.   #3 Dressed area with light dressing. #4 Injection of 0.5 mLs Celestone Soluspan injected into the left heel.    #5 Today we discussed the conservative versus surgical management of the presenting pathology. The patient opts for surgical management. All possible complications and details of the procedure were explained. All patient questions were answered. No guarantees were expressed or implied. #6 Authorization for surgery was re-initiated today. Surgery will consist of Tailor's bunionectomy with 5th metatarsal osteotomy left.  #7 Return to clinic one week post op.     Wife's birthday is [birth year].      Edrick Kins, DPM [facility]  Dr. Edrick Kins, [place]                                        [place], [place] [address]                Office [phone]  Fax [phone]

## 2019-12-09 ENCOUNTER — Encounter: Payer: No Typology Code available for payment source | Admitting: Podiatry

## 2019-12-16 ENCOUNTER — Encounter: Payer: No Typology Code available for payment source | Admitting: Podiatry

## 2019-12-30 ENCOUNTER — Encounter: Payer: No Typology Code available for payment source | Admitting: Podiatry

## 2020-01-11 ENCOUNTER — Telehealth: Payer: Self-pay

## 2020-01-11 NOTE — Telephone Encounter (Signed)
Left message for Peter Webster that I cancelled his surgery on 01/12/20. We were notified by the [facility] that they will not authorize surgery until he sees his pcp with the [facility] [place]. Notified Renee at [employer].

## 2020-01-20 ENCOUNTER — Encounter: Payer: No Typology Code available for payment source | Admitting: Podiatry

## 2020-01-26 ENCOUNTER — Encounter: Payer: No Typology Code available for payment source | Admitting: Podiatry

## 2020-02-10 ENCOUNTER — Encounter: Payer: No Typology Code available for payment source | Admitting: Podiatry

## 2021-06-04 ENCOUNTER — Ambulatory Visit: Payer: No Typology Code available for payment source | Admitting: Surgery

## 2021-06-13 ENCOUNTER — Other Ambulatory Visit: Payer: Self-pay

## 2021-06-13 ENCOUNTER — Ambulatory Visit (INDEPENDENT_AMBULATORY_CARE_PROVIDER_SITE_OTHER): Payer: No Typology Code available for payment source | Admitting: Surgery

## 2021-06-13 ENCOUNTER — Encounter: Payer: Self-pay | Admitting: Surgery

## 2021-06-13 VITALS — BP 129/83 | HR 106 | Temp 98.7°F | Ht 71.0 in | Wt 229.0 lb

## 2021-06-13 DIAGNOSIS — K4090 Unilateral inguinal hernia, without obstruction or gangrene, not specified as recurrent: Secondary | ICD-10-CM | POA: Diagnosis not present

## 2021-06-13 NOTE — Patient Instructions (Addendum)
[facility] [phone] [address]  [place].    Our surgery scheduler will call you within 24-48 hours to schedule your surgery. Please have the blue surgery sheet available when speaking with her.      Hernia, Adult   A hernia is the bulging of an organ or tissue through a weak spot in the muscles of the abdomen. Hernias develop most often near the belly button (navel) or the area where the leg meets the lower abdomen (groin). Common types of hernias include: Incisional hernia. This type bulges through a scar from an abdominal surgery. Umbilical hernia. This type develops near the navel. Inguinal hernia. This type develops in the groin or scrotum. Femoral hernia. This type develops below the groin, in the upper thigh area. Hiatal hernia. This type occurs when part of the stomach slides above the muscle that separates the abdomen from the chest (diaphragm). What are the causes? This condition may be caused by: Heavy lifting. Coughing over a long period of time. Straining to have a bowel movement. Constipation can lead to straining. An incision made during abdominal surgery. A physical problem that is present at birth (congenital defect). Being overweight or obese. Smoking. Excess fluid in the abdomen. Undescended testicles in males. What are the signs or symptoms? The main symptom is a skin-colored, rounded bulge in the area of the hernia. However, a bulge may not always be present. It may grow bigger or be more visible when you cough or strain (such as when lifting something heavy). A hernia that can be pushed back into the abdomen (is reducible) rarely causes pain. A hernia that cannot be pushed back into the abdomen (is incarcerated) may lose its blood supply (become strangulated). A hernia that is incarcerated may cause: Pain. Fever. Nausea and vomiting. Swelling. Constipation. How is this diagnosed? A hernia may be diagnosed based on: Your  symptoms and medical history. A physical exam. Your health care provider may ask you to cough or move in certain ways to see if the hernia becomes visible. Imaging tests, such as: X-rays. Ultrasound. CT scan. How is this treated? A hernia that is small and painless may not need to be treated. A hernia that is large or painful may be treated with surgery. Inguinal hernias may be treated with surgery to prevent incarceration or strangulation. Strangulated hernias are always treated with surgery because the strangulation causes a lack of blood supply to the trapped organ or tissue. Surgery to treat a hernia involves pushing the bulge back into place and repairing the weak area of the muscle or abdominal wall. Follow these instructions at home: Activity Avoid straining. Do not lift anything that is heavier than 10 lb (4.5 kg), or the limit that you are told, until your health care provider says that it is safe. When lifting heavy objects, lift with your leg muscles, not your back muscles. Preventing constipation Take actions to prevent constipation. Constipation leads to straining with bowel movements, which can make a hernia worse or cause a hernia repair to break down. Your health care provider may recommend that you take these actions to prevent or treat constipation: Drink enough fluid to keep your urine pale yellow. Take over-the-counter or prescription medicines. Eat foods that are high in fiber, such as beans, whole grains, and fresh fruits and vegetables. Limit foods that are high in fat and processed sugars, such as fried or sweet foods. General instructions When coughing, try to cough gently. You may try to  push the hernia back in place by very gently pressing on it while lying down. Do not try to force the bulge back in if it will not push in easily. If you are overweight, work with your health care provider to lose weight safely. Do not use any products that contain nicotine or  tobacco. These products include cigarettes, chewing tobacco, and vaping devices, such as e-cigarettes. If you need help quitting, ask your health care provider. If you are scheduled for hernia repair, watch your hernia for any changes in shape, size, or color. Tell your health care provider about any changes or new symptoms. Take over-the-counter and prescription medicines only as told by your health care provider. Keep all follow-up visits. This is important. Contact a health care provider if: You develop new pain, swelling, or redness around your hernia. You have signs of constipation, such as: Fewer bowel movements in a week than normal. Difficulty having a bowel movement. Stools that are dry, hard, or larger than normal. Get help right away if: You have a fever or chills. You have abdominal pain that gets worse. You feel nauseous or you vomit. You cannot push the hernia back in place by very gently pressing on it while lying down. Do not try to force the bulge back in if it will not go in easily. The hernia: Changes in shape, size, or color. Feels hard or tender. These symptoms may represent a serious problem that is an emergency. Do not wait to see if the symptoms will go away. Get medical help right away. Call your local emergency services (911 in the [place] Do not drive yourself to the hospital. Summary A hernia is the bulging of an organ or tissue through a weak spot in the muscles of the abdomen. The main symptom is a skin-colored bulge in the hernia area. However, a bulge may not always be present. It may grow bigger or more visible when you cough or strain (such as when having a bowel movement). A hernia that is small and painless may not need to be treated. A hernia that is large or painful may be treated with surgery. Surgery to treat a hernia involves pushing the bulge back into place and repairing the weak part of the abdomen. This information is not intended to replace advice  given to you by your health care provider. Make sure you discuss any questions you have with your health care provider. Document Revised: 01/30/2020 Document Reviewed: 01/30/2020 Elsevier Patient Education  [place].

## 2021-06-13 NOTE — Progress Notes (Addendum)
Patient ID: Peter Webster, male   DOB: [birth year], 80 y.o.   MRN: [id number]  Chief Complaint: Abdominal wall hernia  History of Present Illness Peter Webster is a 80 y.o. male with periumbilical bulge present for multiple months.  He initially reports it is painful at times but essentially has no exacerbation with bowel movements or voiding.  Apparently protrudes a bit more when he coughs or sneezes and is ambulate.  When he lays supine it readily reduces.    Past Medical History Past Medical History:  Diagnosis Date   Allergy    Arthritis    Hyperlipidemia    Hypertension       Past Surgical History:  Procedure Laterality Date   bilateral hernia  2018   COLONOSCOPY  2019    Allergies  Allergen Reactions   Lisinopril Cough    Current Outpatient Medications  Medication Sig Dispense Refill   Calcium Carb-Cholecalciferol 500-2.5 MG-MCG CHEW CHEW 2 TABLETS BY MOUTH EVERY DAY     calcium carbonate (OSCAL) 1500 (600 Ca) MG TABS tablet Take by mouth 2 (two) times daily with a meal.     cetirizine (ZYRTEC) 10 MG tablet TAKE ONE TABLET BY MOUTH EVERY DAY TAKE ONCE TABLET ONCE PER DAY     Cholecalciferol 1000 units TBDP Take by mouth.     Cholecalciferol 25 MCG (1000 UT) tablet Take 1 tablet by mouth daily.     clobetasol ointment (TEMOVATE) 0.05 % APPLY SMALL AMOUNT TOPICALLY TWO TIMES A DAY DO NOT USE ON FACE OR GENITALS     fluticasone (FLONASE) 50 MCG/ACT nasal spray INSTILL 1 SPRAY INTO EACH NOSTRIL TWO TIMES A DAY MAXIMUM 2 SPRAYS IN EACH NOSTRIL DAILY. REPLACES FLUNISOLIDE     gabapentin (NEURONTIN) 100 MG capsule TAKE THREE CAPSULES BY MOUTH AT BEDTIME FOR MIGRAINE HEADACHES     hydrochlorothiazide (HYDRODIURIL) 25 MG tablet Take 25 mg by mouth daily.     hydrocortisone 2.5 % ointment APPLY THIN FILM TOPICALLY TWO TIMES A DAY - IF SEBULEX SHAMPOO USED AS FACIAL CLEANSER DOES NOT CONTROL SCALING ON  MIDFACE, APPLY THIS MILD TOPICAL STEROID TWICE A DAY TO THE SCALED AREAS  TO REDUCE  INFLAMMATION IN THE SKIN     lisinopril (PRINIVIL,ZESTRIL) 40 MG tablet Take 40 mg by mouth daily.     meloxicam (MOBIC) 15 MG tablet TAKE ONE-HALF TABLET BY MOUTH ONCE DAILY AFTER A MEAL AS NEEDED WITH FOOD FOR PAIN     omeprazole (PRILOSEC) 20 MG capsule Take 20 mg by mouth daily.     sildenafil (VIAGRA) 100 MG tablet TAKE ONE-HALF TABLET BY MOUTH AS DIRECTED AS NEEDED 1 HOUR BEFORE SEXUAL ACTIVITY. DO NOT TAKE WITHIN 6 HOURS OF TERAZOSIN, PRAZOSIN OR DOXAZOSIN. DO NOT TAKE MORE THAN 1 DOSE WITHIN 24 HOURS (REPLACES VARDENAFIL) THIS IS A 90 DAY SUPPLY. WE DO NOT REPLACE.     simvastatin (ZOCOR) 80 MG tablet TAKE ONE TABLET BY MOUTH AT BEDTIME FOR CHOLESTEROL     sodium chloride (OCEAN) 0.65 % nasal spray SPRAY 2 SPRAYS INTO EACH NOSTRIL THREE TIMES A DAY     triamcinolone ointment (KENALOG) 0.1 % APPLY SMALL AMOUNT TOPICALLY TWO TIMES A DAY FOR SKIN RASH DO NOT USE ON FACE, AXILLA,  OR GROIN     valsartan (DIOVAN) 40 MG tablet Take 1 tablet by mouth daily.     No current facility-administered medications for this visit.    Family History Family History  Problem Relation Age of Onset   Stroke Mother  Cervical cancer Mother       Social History Social History   Tobacco Use   Smoking status: Never   Smokeless tobacco: Never  Substance Use Topics   Alcohol use: Yes    Alcohol/week: 1.0 standard drink    Types: 1 Cans of beer per week   Drug use: No        Review of Systems  Constitutional: Negative.   HENT: Negative.    Eyes: Negative.   Respiratory: Negative.    Cardiovascular: Negative.   Gastrointestinal:  Positive for heartburn.  Genitourinary: Negative.   Skin:  Positive for itching.  Neurological:  Positive for headaches.  Psychiatric/Behavioral:  Positive for depression.      Physical Exam Blood pressure 129/83, pulse (!) 106, temperature 98.7 F (37.1 C), temperature source Oral, height 5\' 11"  (1.803 m), weight 229 lb (103.9 kg), SpO2 97 %. Last Weight   Most recent update: 06/13/2021  4:02 PM    Weight  103.9 kg (229 lb)             CONSTITUTIONAL: Well developed, and nourished, appropriately responsive and aware without distress.   EYES: Sclera non-icteric.   EARS, NOSE, MOUTH AND THROAT: Mask worn.    Hearing is intact to voice.  NECK: Trachea is midline, and there is no jugular venous distension.  LYMPH NODES:  Lymph nodes in the neck are not enlarged. RESPIRATORY:  Lungs are clear, and breath sounds are equal bilaterally. Normal respiratory effort without pathologic use of accessory muscles. CARDIOVASCULAR: Heart is regular in rate and rhythm. GI: The abdomen has laparoscopic scars with a 4 cm midline supra umbilical scar, underlying this is a soft fleshy mass which is readily reducible with what feels like a 2 cm fascial defect.  Otherwise soft, nontender, and nondistended. There were no palpable masses. I did not appreciate hepatosplenomegaly. There were normal bowel sounds.  MUSCULOSKELETAL:  Symmetrical muscle tone appreciated in all four extremities.    SKIN: Skin turgor is normal. No pathologic skin lesions appreciated.  NEUROLOGIC:  Motor and sensation appear grossly normal.  Cranial nerves are grossly without defect. PSYCH:  Alert and oriented to person, place and time. Affect is appropriate for situation.  Data Reviewed I have personally reviewed what is currently available of the patient's imaging, recent labs and medical records.   Labs:  No flowsheet data found. No flowsheet data found.    Imaging: Radiology review: CT report reviewed. Within last 24 hrs: No results found.  Assessment    Incisional hernia. There are no problems to display for this patient.   Plan    Robotic incisional hernia repair. I discussed possibility of incarceration, strangulation, enlargement in size over time, and the need for emergency surgery in the face of these.  Also reviewed the techniques of reduction should incarceration  occur, and when unsuccessful to present to the ED.  Also discussed that surgery risks include recurrence which can be up to 30% in the case of complex hernias, use of prosthetic materials (mesh) and the increased risk of infection and the possible need for re-operation and removal of mesh, possibility of post-op SBO or ileus, and the risks of general anesthetic including heart attack, stroke, sudden death or some reaction to anesthetic medications. The patient, and those present, appear to understand the risks, any and all questions were answered to the patient's satisfaction.  No guarantees were ever expressed or implied.   Face-to-face time spent with the patient and accompanying care providers(if present)  was 30 minutes, with more than 50% of the time spent counseling, educating, and coordinating care of the patient.    These notes generated with voice recognition software. I apologize for typographical errors.  Ronny Bacon M.D., FACS 06/14/2021, 9:10 AM

## 2021-06-14 ENCOUNTER — Ambulatory Visit: Payer: Self-pay | Admitting: Surgery

## 2021-06-14 ENCOUNTER — Telehealth: Payer: Self-pay

## 2021-06-14 DIAGNOSIS — K4090 Unilateral inguinal hernia, without obstruction or gangrene, not specified as recurrent: Secondary | ICD-10-CM

## 2021-06-14 NOTE — Telephone Encounter (Signed)
Spoke with Peter Webster at [facility] was provided fax number for Dr.Muhul Lendon Collar [phone]-VTVNRW number [phone].  Spoke with patient and he will call back to verify the fax number is correct.

## 2021-06-17 ENCOUNTER — Ambulatory Visit
Admission: RE | Admit: 2021-06-17 | Discharge: 2021-06-17 | Disposition: A | Discharge status: Home / Self Care | Payer: Self-pay | Source: Ambulatory Visit | Attending: Surgery | Admitting: Surgery

## 2021-06-17 ENCOUNTER — Other Ambulatory Visit: Payer: Self-pay | Admitting: Surgery

## 2021-06-17 DIAGNOSIS — K432 Incisional hernia without obstruction or gangrene: Secondary | ICD-10-CM

## 2021-06-19 ENCOUNTER — Telehealth: Payer: Self-pay | Admitting: Surgery

## 2021-06-19 NOTE — Telephone Encounter (Signed)
Outgoing call is made, left message for patient to call.  Please inform of the following for surgery:   Pre-Admission date/time, COVID Testing date and Surgery date.  Surgery Date: 08/02/21 Preadmission Testing Date: 07/22/21 (phone 8a-1p) Covid Testing Date: Not needed.    Also patient will need to call at [phone], between 1-3:00pm the day before surgery, to find out what time to arrive for surgery.

## 2021-06-20 NOTE — Telephone Encounter (Signed)
Incoming call from patient, he is now aware of all dates regarding his surgery and verbalized understanding.

## 2021-07-12 ENCOUNTER — Telehealth: Payer: Self-pay

## 2021-07-12 NOTE — Telephone Encounter (Signed)
Spoke with [facility] nurse-she is going to send a message to the care manager and DR.Trevidi again to see if the Medical Clearance has been completed. Direct number for Nurse [facility] [phone].

## 2021-07-16 ENCOUNTER — Telehealth: Payer: Self-pay

## 2021-07-16 NOTE — Telephone Encounter (Signed)
Spoke with Nurse Wonder at the [facility] multiple times requesting Medical Clearance form. She stated she has emailed the department of Dr.Trevidi and not received a response. I have asked the patient to call Dr.Trevidi office on several occasions and still have not received the medical clearance form. I have faxed the Medical clearance form 4 times and no response . Verified the correct fax number with nurse [phone].  I spoke with the patient this morning and ask that he call and request the Medical Clearance be completed and faxed to our office today.

## 2021-07-22 ENCOUNTER — Inpatient Hospital Stay: Admission: RE | Admit: 2021-07-22 | Payer: Self-pay | Source: Ambulatory Visit

## 2021-07-26 ENCOUNTER — Inpatient Hospital Stay
Admission: RE | Admit: 2021-07-26 | Discharge: 2021-07-26 | Disposition: A | Discharge status: Home / Self Care | Payer: Self-pay | Source: Ambulatory Visit

## 2021-07-26 NOTE — Patient Instructions (Signed)
Your procedure is scheduled [id number] August 02, 2021. Report to Day Surgery inside [facility] 2nd floor.  To find out your arrival time please call [phone] between 1PM - 3PM on Thursday August 01, 2021.  Remember: Instructions that are not followed completely may result in serious medical risk,  up to and including death, or upon the discretion of your surgeon and anesthesiologist your  surgery may need to be rescheduled.     _X__ 1. Do not eat food after midnight the night before your procedure.                 No chewing gum or hard candies. You may drink clear liquids up to 2 hours                 before you are scheduled to arrive for your surgery- DO not drink clear                 liquids within 2 hours of the start of your surgery.                 Clear Liquids include:  water, apple juice without pulp, clear Gatorade, G2 or                  Gatorade Zero (avoid Red/Purple/Blue), Black Coffee or Tea (Do not add                 anything to coffee or tea).  __X__2.  On the morning of surgery brush your teeth with toothpaste and water, you                may rinse your mouth with mouthwash if you wish.  Do not swallow any toothpaste or mouthwash.     _X__ 3.  No Alcohol for 24 hours before or after surgery.   _X__ 4.  Do Not Smoke or use e-cigarettes For 24 Hours Prior to Your Surgery.                 Do not use any chewable tobacco products for at least 6 hours prior to                 Surgery.  _X__  5.  Do not use any recreational drugs (marijuana, cocaine, heroin, ecstasy, MDMA or other)                For at least one week prior to your surgery.  Combination of these drugs with anesthesia                May have life threatening results.  ____  6.  Bring all medications with you on the day of surgery if instructed.   __X__  7.  Notify your doctor if there is any change in your medical condition      (cold, fever, infections).     Do not  wear jewelry, make-up, hairpins, clips or nail polish. Do not wear lotions, powders, or perfumes. You may wear deodorant. Do not shave 48 hours prior to surgery. Men may shave face and neck. Do not bring valuables to the hospital.    [facility] is not responsible for any belongings or valuables.  Contacts, dentures or bridgework may not be worn into surgery. Leave your suitcase in the car. After surgery it may be brought to your room. For patients admitted to the hospital, discharge time is determined by your treatment team.   Patients discharged  the day of surgery will not be allowed to drive home.   Make arrangements for someone to be with you for the first 24 hours of your Same Day Discharge.   __X__ Take these medicines the morning of surgery with A SIP OF WATER:    1. omeprazole (PRILOSEC) 20 MG   2.   3.   4.  5.  6.  ____ Fleet Enema (as directed)   ____ Use CHG Soap (or wipes) as directed  ____ Use Benzoyl Peroxide Gel as instructed  ____ Use inhalers on the day of surgery  ____ Stop metformin 2 days prior to surgery    ____ Take 1/2 of usual insulin dose the night before surgery. No insulin the morning          of surgery.   ____ Call your PCP, cardiologist, or Pulmonologist if taking Coumadin/Plavix/aspirin and ask when to stop before your surgery.   __X__ One Week prior to surgery- Stop Anti-inflammatories such as Ibuprofen, Aleve, Advil, Motrin, meloxicam (MOBIC), diclofenac, etodolac, ketorolac, Toradol, Daypro, piroxicam, Goody's or BC powders. OK TO USE TYLENOL IF NEEDED   __X__ Stop supplements until after surgery.    ____ Bring C-Pap to the hospital.    If you have any questions regarding your pre-procedure instructions,  Please call Pre-admit Testing at [phone]

## 2021-07-29 ENCOUNTER — Other Ambulatory Visit: Payer: Self-pay

## 2021-07-29 ENCOUNTER — Telehealth: Payer: Self-pay | Admitting: Surgery

## 2021-07-29 NOTE — Telephone Encounter (Signed)
Patient calls to cancel his surgery.  Says he has a lot of other health issues that he wants to focus on at this time.  Does not wish to reschedule at this time.  He is advised to when he decides to reschedule to call and schedule follow up with Dr. Christian Mate as it sounds as though he may wait until spring or so to reschedule.

## 2021-07-30 ENCOUNTER — Ambulatory Visit: Payer: No Typology Code available for payment source | Admitting: Surgery

## 2021-08-02 ENCOUNTER — Ambulatory Visit: Admit: 2021-08-02 | Payer: Self-pay | Admitting: Surgery

## 2021-08-02 SURGERY — REPAIR, HERNIA, VENTRAL, ROBOT-ASSISTED
Anesthesia: General

## 2021-08-23 ENCOUNTER — Encounter: Payer: Self-pay | Admitting: Podiatry

## 2021-08-23 ENCOUNTER — Other Ambulatory Visit: Payer: Self-pay

## 2021-08-23 ENCOUNTER — Ambulatory Visit (INDEPENDENT_AMBULATORY_CARE_PROVIDER_SITE_OTHER): Payer: No Typology Code available for payment source | Admitting: Podiatry

## 2021-08-23 DIAGNOSIS — L989 Disorder of the skin and subcutaneous tissue, unspecified: Secondary | ICD-10-CM

## 2021-08-23 DIAGNOSIS — M79676 Pain in unspecified toe(s): Secondary | ICD-10-CM | POA: Diagnosis not present

## 2021-08-23 DIAGNOSIS — B351 Tinea unguium: Secondary | ICD-10-CM

## 2021-08-23 DIAGNOSIS — C9000 Multiple myeloma not having achieved remission: Secondary | ICD-10-CM | POA: Insufficient documentation

## 2021-08-23 DIAGNOSIS — D696 Thrombocytopenia, unspecified: Secondary | ICD-10-CM | POA: Insufficient documentation

## 2021-08-23 DIAGNOSIS — N179 Acute kidney failure, unspecified: Secondary | ICD-10-CM | POA: Insufficient documentation

## 2021-08-23 NOTE — Progress Notes (Signed)
° °  Subjective: 81 year old male presents to the office today for evaluation of a symptomatic callus to the plantar aspect of the fifth MTP left foot as well as elongated toenails bilateral.  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  His nails have become increasingly long and thick and tender.  He cannot trim his own toenails.  He is requesting a nail trim and callus debridement today  Past Medical History:  Diagnosis Date   Allergy    Arthritis    Hyperlipidemia    Hypertension    Past Surgical History:  Procedure Laterality Date   bilateral hernia  2018   COLONOSCOPY  2019   Allergies  Allergen Reactions   Lisinopril Cough     Objective:  Physical Exam General: Alert and oriented x3 in no acute distress  Dermatology: Hyperkeratotic lesion present on the sub-fifth MPJ of the left foot. Pain on palpation with a central nucleated core noted.  Hyperkeratotic elongated nails also noted 1-5 bilateral skin is warm, dry and supple bilateral lower extremities. Negative for open lesions or macerations.  Vascular: Palpable pedal pulses bilaterally. No edema or erythema noted. Capillary refill within normal limits.  Neurological: Epicritic and protective threshold grossly intact bilaterally.   Musculoskeletal Exam: Pain on palpation at the keratotic lesion noted.  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 #2 Excisional debridement of keratoic lesion using a chisel blade was performed without incident.   #3 mechanical debridement of nails 1-5 bilateral was performed using a nail nipper without incident or bleeding #4 for now we are going to continue conservative treatment #5 return to clinic as needed  *Patient currently going through chemotherapy.  Wife's [birth year] is [birth year].      Edrick Kins, DPM [facility]  Dr. Edrick Kins, DPM    [address], [place] [address]                Office [phone]  Fax [phone]

## 2021-11-14 ENCOUNTER — Ambulatory Visit: Payer: No Typology Code available for payment source | Admitting: Surgery

## 2021-11-19 ENCOUNTER — Ambulatory Visit (INDEPENDENT_AMBULATORY_CARE_PROVIDER_SITE_OTHER): Payer: No Typology Code available for payment source | Admitting: Surgery

## 2021-11-19 ENCOUNTER — Encounter: Payer: Self-pay | Admitting: Surgery

## 2021-11-19 ENCOUNTER — Other Ambulatory Visit: Payer: Self-pay

## 2021-11-19 ENCOUNTER — Ambulatory Visit: Payer: Self-pay | Admitting: Surgery

## 2021-11-19 VITALS — BP 145/101 | HR 99 | Temp 97.8°F | Ht 71.0 in | Wt 206.0 lb

## 2021-11-19 DIAGNOSIS — R972 Elevated prostate specific antigen [PSA]: Secondary | ICD-10-CM | POA: Insufficient documentation

## 2021-11-19 DIAGNOSIS — K432 Incisional hernia without obstruction or gangrene: Secondary | ICD-10-CM | POA: Diagnosis not present

## 2021-11-19 DIAGNOSIS — Z5112 Encounter for antineoplastic immunotherapy: Secondary | ICD-10-CM | POA: Insufficient documentation

## 2021-11-19 DIAGNOSIS — S22080S Wedge compression fracture of T11-T12 vertebra, sequela: Secondary | ICD-10-CM | POA: Insufficient documentation

## 2021-11-19 DIAGNOSIS — C679 Malignant neoplasm of bladder, unspecified: Secondary | ICD-10-CM | POA: Insufficient documentation

## 2021-11-19 NOTE — Patient Instructions (Signed)
Our surgery scheduler will call you within 24-48 hours to schedule your surgery. Please have the Blue surgery sheet available when speaking with her.    Ventral Hernia  A ventral hernia is a bulge of tissue from inside the abdomen that pushes through a weak area of the muscles that form the front wall of the abdomen. The tissues inside the abdomen are inside a sac (peritoneum). These tissues include the small intestine, large intestine, and the fatty tissue that covers the intestines (omentum). Sometimes, the bulge that forms a hernia contains intestines. Other hernias contain only fat. Ventral hernias do not go away without surgical treatment. There are several types of ventral hernias. You may have: A hernia at an incision site from previous abdominal surgery (incisional hernia). A hernia just above the belly button (epigastric hernia), or at the belly button (umbilical hernia). These types of hernias can develop from heavy lifting or straining. A hernia that comes and goes (reducible hernia). It may be visible only when you lift or strain. This type of hernia can be pushed back into the abdomen (reduced). A hernia that traps abdominal tissue inside the hernia (incarcerated hernia). This type of hernia does not reduce. A hernia that cuts off blood flow to the tissues inside the hernia (strangulated hernia). The tissues can start to die if this happens. This is a very painful bulge that cannot be reduced. A strangulated hernia is a medical emergency. What are the causes? This condition is caused by abdominal tissue putting pressure on an area of weakness in the abdominal muscles. What increases the risk? The following factors may make you more likely to develop this condition: Being age 60 or older. Being overweight or obese. Having had previous abdominal surgery, especially if there was an infection after surgery. Having had an injury to the abdominal wall. Frequently lifting or pushing heavy  objects. Having had several pregnancies. Having a buildup of fluid inside the abdomen (ascites). Straining to have a bowel movement or to urinate. Having frequent coughing episodes. What are the signs or symptoms? The only symptom of a ventral hernia may be a painless bulge in the abdomen. A reducible hernia may be visible only when you strain, cough, or lift. Other symptoms may include: Dull pain. A feeling of pressure. Signs and symptoms of a strangulated hernia may include: Increasing pain. Nausea and vomiting. Pain when pressing on the hernia. The skin over the hernia turning red or purple. Constipation. Blood in the stool (feces). How is this diagnosed? This condition may be diagnosed based on: Your symptoms. Your medical history. A physical exam. You may be asked to cough or strain while standing. These actions increase the pressure inside your abdomen and force the hernia through the opening in your muscles. Your health care provider may try to reduce the hernia by gently pushing the hernia back in. Imaging studies, such as an ultrasound or CT scan. How is this treated? This condition is treated with surgery. If you have a strangulated hernia, surgery is done as soon as possible. If your hernia is small and not incarcerated, you may be asked to lose some weight before surgery. Follow these instructions at home: Follow instructions from your health care provider about eating or drinking restrictions. If you are overweight, your health care provider may recommend that you increase your activity level and eat a healthier diet. Do not lift anything that is heavier than 10 lb (4.5 kg), or the limit that you are told, until your   health care provider says that it is safe. Return to your normal activities as told by your health care provider. Ask your health care provider what activities are safe for you. You may need to avoid activities that increase pressure on your hernia. Take  over-the-counter and prescription medicines only as told by your health care provider. Keep all follow-up visits. This is important. Contact a health care provider if: Your hernia gets larger. Your hernia becomes painful. Get help right away if: Your hernia becomes increasingly painful. You have pain along with any of the following: Changes in skin color in the area of the hernia. Nausea. Vomiting. Fever. These symptoms may represent a serious problem that is an emergency. Do not wait to see if the symptoms will go away. Get medical help right away. Call your local emergency services (911 in the [place] Do not drive yourself to the hospital. Summary A ventral hernia is a bulge of tissue from inside the abdomen that pushes through a weak area of the muscles that form the front wall of the abdomen. This condition is treated with surgery, which may be urgent depending on your hernia. Do not lift anything that is heavier than 10 lb (4.5 kg), and follow activity instructions from your health care provider. This information is not intended to replace advice given to you by your health care provider. Make sure you discuss any questions you have with your health care provider. Document Revised: 02/10/2020 Document Reviewed: 02/10/2020 Elsevier Patient Education  2023 [employer]  

## 2021-11-19 NOTE — H&P (View-Only) (Signed)
Patient ID: Peter Webster, male   DOB: 12/13/1940, 81 y.o.   MRN: [id number] ? ?Chief Complaint: Abdominal wall hernia ? ?History of Present Illness ?Peter Webster is a 81 y.o. male with periumbilical bulge present for multiple months.  He initially reports it is painful at times but essentially has no exacerbation with bowel movements or voiding.  Apparently protrudes a bit more when he coughs or sneezes and is ambulate.  When he lays supine it readily reduces.  Recently completed his course of chemotherapy, and will be transitioning to oral maintenance. ? ?Past Medical History ?Past Medical History:  ?Diagnosis Date  ? Allergy   ? Arthritis   ? Hyperlipidemia   ? Hypertension   ?  ? ? ?Past Surgical History:  ?Procedure Laterality Date  ? bilateral hernia  2018  ? COLONOSCOPY  2019  ? ? ?Allergies  ?Allergen Reactions  ? Lisinopril Cough  ? ? ?Current Outpatient Medications  ?Medication Sig Dispense Refill  ? acyclovir (ZOVIRAX) 200 MG capsule TAKE TWO CAPSULES BY MOUTH TWO TIMES A DAY SHINGLES PROPHYLAXIS    ? amLODipine (NORVASC) 10 MG tablet TAKE ONE-HALF TABLET BY MOUTH EVERY DAY FOR BLOOD PRESSURE    ? Calcium Carb-Cholecalciferol 500-2.5 MG-MCG CHEW Chew 2 tablets by mouth daily.    ? capsicum (ZOSTRIX) 0.075 % topical cream APPLY SUFFICIENT AMOUNT TOPICALLY 3 TIMES A DAY AS NEEDED BACK SPASM AVOID GETTING ON FACE, IN EYES OR ON SENSITIVE AREAS. WASH HANDS THOROUGHLY AFTER USING.    ? cetirizine (ZYRTEC) 10 MG tablet Take 10 mg by mouth daily.    ? Cholecalciferol 25 MCG (1000 UT) tablet Take 1,000 Units by mouth daily.    ? clobetasol ointment (TEMOVATE) 0.05 % 1 application daily as needed (on face and Genitals).    ? cyanocobalamin 1000 MCG tablet TAKE ONE TABLET BY MOUTH EVERY DAY TO PREVENT VITAMIN B12 DEFICIENCY    ? Docusate Sodium (DSS) 100 MG CAPS TAKE ONE CAPSULE BY MOUTH TWO TIMES A DAY FOR PREVENTION OF CONSTIPATION    ? gabapentin (NEURONTIN) 100 MG capsule 100 mg at bedtime.    ? gabapentin  (NEURONTIN) 100 MG capsule TAKE THREE CAPSULES BY MOUTH AT BEDTIME FOR MIGRAINE HEADACHES    ? hydrocortisone (ANUSOL-HC) 25 MG suppository INSERT 1 SUPPOSITORY(IES) IN RECTUM EVERY DAY AS NEEDED    ? hydrocortisone 2.5 % ointment 1 application daily as needed (Scaling).    ? lidocaine (XYLOCAINE) 5 % ointment APPLY LIBERAL AMOUNT TOPICALLY FOUR TIMES A DAY AS NEEDED FOR BACK PAIN    ? meloxicam (MOBIC) 15 MG tablet Take 7.5 mg by mouth daily.    ? omeprazole (PRILOSEC) 20 MG capsule Take 20 mg by mouth daily.    ? prochlorperazine (COMPAZINE) 5 MG tablet TAKE TWO TABLETS BY MOUTH EVERY 6 HOURS AS NEEDED *VCM ORDER* FOR NAUSEA/VOMITING.    ? sildenafil (VIAGRA) 100 MG tablet Take 100 mg by mouth as needed for erectile dysfunction.    ? sildenafil (VIAGRA) 100 MG tablet TAKE ONE-HALF TABLET BY MOUTH AS DIRECTED AS NEEDED 1 HOUR BEFORE SEXUAL ACTIVITY. DO NOT TAKE WITHIN 6 HOURS OF TERAZOSIN, PRAZOSIN OR DOXAZOSIN. DO NOT TAKE MORE THAN 1 DOSE WITHIN 24 HOURS (REPLACES VARDENAFIL) THIS IS A 90 DAY SUPPLY. WE DO NOT REPLACE.    ? simvastatin (ZOCOR) 80 MG tablet TAKE ONE TABLET BY MOUTH AT BEDTIME FOR CHOLESTEROL    ? tiZANidine (ZANAFLEX) 4 MG tablet TAKE ONE TABLET BY MOUTH 3 TIMES A DAY AS NEEDED   BACK SPASM    ? triamcinolone ointment (KENALOG) 0.1 % 1 application daily as needed (Dry skin).    ? urea (CARMOL) 20 % cream APPLY SMALL AMOUNT TOPICALLY IN THE MORNING TO FEET    ? ?No current facility-administered medications for this visit.  ? ? ?Family History ?Family History  ?Problem Relation Age of Onset  ? Stroke Mother   ? Cervical cancer Mother   ?  ? ? ?Social History ?Social History  ? ?Tobacco Use  ? Smoking status: Never  ? Smokeless tobacco: Never  ?Substance Use Topics  ? Alcohol use: Yes  ?  Alcohol/week: 1.0 standard drink  ?  Types: 1 Cans of beer per week  ? Drug use: No  ?  ?  ? ? ?Review of Systems  ?Constitutional: Negative.   ?HENT: Negative.    ?Eyes: Negative.   ?Respiratory: Negative.     ?Cardiovascular: Negative.   ?Gastrointestinal:  Positive for heartburn.  ?Genitourinary: Negative.   ?Skin:  Positive for itching.  ?Neurological:  Positive for headaches.  ?Psychiatric/Behavioral:  Positive for depression.   ?  ? ?Physical Exam ?Blood pressure (!) 145/101, pulse 99, temperature 97.8 ?F (36.6 ?C), temperature source Oral, height 5' 11" (1.803 m), weight 206 lb (93.4 kg), SpO2 98 %. ?Last Weight  Most recent update: 11/19/2021  2:58 PM  ? ? Weight  ?93.4 kg (206 lb)  ?      ? ?  ? ? ?CONSTITUTIONAL: Well developed, and nourished, appropriately responsive and aware without distress.   ?EYES: Sclera non-icteric.   ?EARS, NOSE, MOUTH AND THROAT: Mask worn.    Hearing is intact to voice.  ?NECK: Trachea is midline, and there is no jugular venous distension.  ?LYMPH NODES:  Lymph nodes in the neck are not enlarged. ?RESPIRATORY:  Lungs are clear, and breath sounds are equal bilaterally. Normal respiratory effort without pathologic use of accessory muscles. ?CARDIOVASCULAR: Heart is regular in rate and rhythm. ?GI: The abdomen has laparoscopic scars with a 4 cm midline supra umbilical scar, underlying this is a soft fleshy mass which is readily reducible with what feels like a 3 cm fascial defect.  Otherwise soft, nontender, and nondistended. There were no palpable masses. I did not appreciate hepatosplenomegaly. There were normal bowel sounds. ? ?MUSCULOSKELETAL:  Symmetrical muscle tone appreciated in all four extremities.    ?SKIN: Skin turgor is normal. No pathologic skin lesions appreciated.  ?NEUROLOGIC:  Motor and sensation appear grossly normal.  Cranial nerves are grossly without defect. ?PSYCH:  Alert and oriented to person, place and time. Affect is appropriate for situation. ? ?Data Reviewed ?I have personally reviewed what is currently available of the patient's imaging, recent labs and medical records.   ?Labs:  ?   ? View : No data to display.  ?  ?  ?  ? ?   ? View : No data to display.  ?   ?  ?  ? ? ? ? ?Imaging: ?Radiology review: CT report reviewed. ?Within last 24 hrs: No results found. ? ?Assessment ?   ?Incisional hernia, 3 cm defect. ?Patient Active Problem List  ? Diagnosis Date Noted  ? Elevated prostate specific antigen (PSA) 11/19/2021  ? Encounter for antineoplastic immunotherapy 11/19/2021  ? Wedge compression fracture of t11-T12 vertebra, sequela 11/19/2021  ? Acute kidney failure, unspecified (HCC) 08/23/2021  ? Multiple myeloma not having achieved remission (HCC) 08/23/2021  ? Thrombocytopenia (HCC) 08/23/2021  ? Right inguinal hernia 06/14/2021  ? ? ? ?  Plan ?   ?Robotic incisional hernia repair, 3 cm fascial defect. ?I discussed possibility of incarceration, strangulation, enlargement in size over time, and the need for emergency surgery in the face of these.  Also reviewed the techniques of reduction should incarceration occur, and when unsuccessful to present to the ED.  Also discussed that surgery risks include recurrence which can be up to 30% in the case of complex hernias, use of prosthetic materials (mesh) and the increased risk of infection and the possible need for re-operation and removal of mesh, possibility of post-op SBO or ileus, and the risks of general anesthetic including heart attack, stroke, sudden death or some reaction to anesthetic medications. The patient, and those present, appear to understand the risks, any and all questions were answered to the patient's satisfaction.  No guarantees were ever expressed or implied. ? ? ?Face-to-face time spent with the patient and accompanying care providers(if present) was 30 minutes, with more than 50% of the time spent counseling, educating, and coordinating care of the patient.   ? ?These notes generated with voice recognition software. I apologize for typographical errors. ? ?Devonte Migues M.D., FACS ?11/19/2021, 3:11 PM ? ? ? ? ?

## 2021-11-19 NOTE — Progress Notes (Signed)
Patient ID: Peter Webster, male   DOB: [birth year], 81 y.o.   MRN: [id number] ? ?Chief Complaint: Abdominal wall hernia ? ?History of Present Illness ?Peter Webster is a 81 y.o. male with periumbilical bulge present for multiple months.  He initially reports it is painful at times but essentially has no exacerbation with bowel movements or voiding.  Apparently protrudes a bit more when he coughs or sneezes and is ambulate.  When he lays supine it readily reduces.  Recently completed his course of chemotherapy, and will be transitioning to oral maintenance. ? ?Past Medical History ?Past Medical History:  ?Diagnosis Date  ? Allergy   ? Arthritis   ? Hyperlipidemia   ? Hypertension   ?  ? ? ?Past Surgical History:  ?Procedure Laterality Date  ? bilateral hernia  2018  ? COLONOSCOPY  2019  ? ? ?Allergies  ?Allergen Reactions  ? Lisinopril Cough  ? ? ?Current Outpatient Medications  ?Medication Sig Dispense Refill  ? acyclovir (ZOVIRAX) 200 MG capsule TAKE TWO CAPSULES BY MOUTH TWO TIMES A DAY SHINGLES PROPHYLAXIS    ? amLODipine (NORVASC) 10 MG tablet TAKE ONE-HALF TABLET BY MOUTH EVERY DAY FOR BLOOD PRESSURE    ? Calcium Carb-Cholecalciferol 500-2.5 MG-MCG CHEW Chew 2 tablets by mouth daily.    ? capsicum (ZOSTRIX) 0.075 % topical cream APPLY SUFFICIENT AMOUNT TOPICALLY 3 TIMES A DAY AS NEEDED BACK SPASM AVOID GETTING ON FACE, IN EYES OR ON SENSITIVE AREAS. Mineral HANDS THOROUGHLY AFTER USING.    ? cetirizine (ZYRTEC) 10 MG tablet Take 10 mg by mouth daily.    ? Cholecalciferol 25 MCG (1000 UT) tablet Take 1,000 Units by mouth daily.    ? clobetasol ointment (TEMOVATE) 1.75 % 1 application daily as needed (on face and Genitals).    ? cyanocobalamin 1000 MCG tablet TAKE ONE TABLET BY MOUTH EVERY DAY TO PREVENT VITAMIN B12 DEFICIENCY    ? Docusate Sodium (DSS) 100 MG CAPS TAKE ONE CAPSULE BY MOUTH TWO TIMES A DAY FOR PREVENTION OF CONSTIPATION    ? gabapentin (NEURONTIN) 100 MG capsule 100 mg at bedtime.    ? gabapentin  (NEURONTIN) 100 MG capsule TAKE THREE CAPSULES BY MOUTH AT BEDTIME FOR MIGRAINE HEADACHES    ? hydrocortisone (ANUSOL-HC) 25 MG suppository INSERT 1 SUPPOSITORY(IES) IN RECTUM EVERY DAY AS NEEDED    ? hydrocortisone 2.5 % ointment 1 application daily as needed (Scaling).    ? lidocaine (XYLOCAINE) 5 % ointment APPLY LIBERAL AMOUNT TOPICALLY FOUR TIMES A DAY AS NEEDED FOR BACK PAIN    ? meloxicam (MOBIC) 15 MG tablet Take 7.5 mg by mouth daily.    ? omeprazole (PRILOSEC) 20 MG capsule Take 20 mg by mouth daily.    ? prochlorperazine (COMPAZINE) 5 MG tablet TAKE TWO TABLETS BY MOUTH EVERY 6 HOURS AS NEEDED *VCM ORDER* FOR NAUSEA/VOMITING.    ? sildenafil (VIAGRA) 100 MG tablet Take 100 mg by mouth as needed for erectile dysfunction.    ? sildenafil (VIAGRA) 100 MG tablet TAKE ONE-HALF TABLET BY MOUTH AS DIRECTED AS NEEDED 1 HOUR BEFORE SEXUAL ACTIVITY. DO NOT TAKE WITHIN 6 HOURS OF TERAZOSIN, PRAZOSIN OR DOXAZOSIN. DO NOT TAKE MORE THAN 1 DOSE WITHIN 24 HOURS (REPLACES VARDENAFIL) THIS IS A 90 DAY SUPPLY. WE DO NOT REPLACE.    ? simvastatin (ZOCOR) 80 MG tablet TAKE ONE TABLET BY MOUTH AT BEDTIME FOR CHOLESTEROL    ? tiZANidine (ZANAFLEX) 4 MG tablet TAKE ONE TABLET BY MOUTH 3 TIMES A DAY AS NEEDED  BACK SPASM    ? triamcinolone ointment (KENALOG) 0.1 % 1 application daily as needed (Dry skin).    ? urea (CARMOL) 20 % cream APPLY SMALL AMOUNT TOPICALLY IN THE MORNING TO FEET    ? ?No current facility-administered medications for this visit.  ? ? ?Family History ?Family History  ?Problem Relation Age of Onset  ? Stroke Mother   ? Cervical cancer Mother   ?  ? ? ?Social History ?Social History  ? ?Tobacco Use  ? Smoking status: Never  ? Smokeless tobacco: Never  ?Substance Use Topics  ? Alcohol use: Yes  ?  Alcohol/week: 1.0 standard drink  ?  Types: 1 Cans of beer per week  ? Drug use: No  ?  ?  ? ? ?Review of Systems  ?Constitutional: Negative.   ?HENT: Negative.    ?Eyes: Negative.   ?Respiratory: Negative.     ?Cardiovascular: Negative.   ?Gastrointestinal:  Positive for heartburn.  ?Genitourinary: Negative.   ?Skin:  Positive for itching.  ?Neurological:  Positive for headaches.  ?Psychiatric/Behavioral:  Positive for depression.   ?  ? ?Physical Exam ?Blood pressure (!) 145/101, pulse 99, temperature 97.8 ?F (36.6 ?C), temperature source Oral, height 5' 11" (1.803 m), weight 206 lb (93.4 kg), SpO2 98 %. ?Last Weight  Most recent update: 11/19/2021  2:58 PM  ? ? Weight  ?93.4 kg (206 lb)  ?      ? ?  ? ? ?CONSTITUTIONAL: Well developed, and nourished, appropriately responsive and aware without distress.   ?EYES: Sclera non-icteric.   ?EARS, NOSE, MOUTH AND THROAT: Mask worn.    Hearing is intact to voice.  ?NECK: Trachea is midline, and there is no jugular venous distension.  ?LYMPH NODES:  Lymph nodes in the neck are not enlarged. ?RESPIRATORY:  Lungs are clear, and breath sounds are equal bilaterally. Normal respiratory effort without pathologic use of accessory muscles. ?CARDIOVASCULAR: Heart is regular in rate and rhythm. ?GI: The abdomen has laparoscopic scars with a 4 cm midline supra umbilical scar, underlying this is a soft fleshy mass which is readily reducible with what feels like a 3 cm fascial defect.  Otherwise soft, nontender, and nondistended. There were no palpable masses. I did not appreciate hepatosplenomegaly. There were normal bowel sounds. ? ?MUSCULOSKELETAL:  Symmetrical muscle tone appreciated in all four extremities.    ?SKIN: Skin turgor is normal. No pathologic skin lesions appreciated.  ?NEUROLOGIC:  Motor and sensation appear grossly normal.  Cranial nerves are grossly without defect. ?PSYCH:  Alert and oriented to person, place and time. Affect is appropriate for situation. ? ?Data Reviewed ?I have personally reviewed what is currently available of the patient's imaging, recent labs and medical records.   ?Labs:  ?   ? View : No data to display.  ?  ?  ?  ? ?   ? View : No data to display.  ?   ?  ?  ? ? ? ? ?Imaging: ?Radiology review: CT report reviewed. ?Within last 24 hrs: No results found. ? ?Assessment ?   ?Incisional hernia, 3 cm defect. ?Patient Active Problem List  ? Diagnosis Date Noted  ? Elevated prostate specific antigen (PSA) 11/19/2021  ? Encounter for antineoplastic immunotherapy 11/19/2021  ? Wedge compression fracture of t11-T12 vertebra, sequela 11/19/2021  ? Acute kidney failure, unspecified ([place]) 08/23/2021  ? Multiple myeloma not having achieved remission ([place]) 08/23/2021  ? Thrombocytopenia ([place]) 08/23/2021  ? Right inguinal hernia 06/14/2021  ? ? ? ?  Plan ?   ?Robotic incisional hernia repair, 3 cm fascial defect. ?I discussed possibility of incarceration, strangulation, enlargement in size over time, and the need for emergency surgery in the face of these.  Also reviewed the techniques of reduction should incarceration occur, and when unsuccessful to present to the ED.  Also discussed that surgery risks include recurrence which can be up to 30% in the case of complex hernias, use of prosthetic materials (mesh) and the increased risk of infection and the possible need for re-operation and removal of mesh, possibility of post-op SBO or ileus, and the risks of general anesthetic including heart attack, stroke, sudden death or some reaction to anesthetic medications. The patient, and those present, appear to understand the risks, any and all questions were answered to the patient's satisfaction.  No guarantees were ever expressed or implied. ? ? ?Face-to-face time spent with the patient and accompanying care providers(if present) was 30 minutes, with more than 50% of the time spent counseling, educating, and coordinating care of the patient.   ? ?These notes generated with voice recognition software. I apologize for typographical errors. ? ?Ronny Bacon M.D., FACS ?11/19/2021, 3:11 PM ? ? ? ? ?

## 2021-11-20 ENCOUNTER — Telehealth: Payer: Self-pay | Admitting: Surgery

## 2021-11-20 ENCOUNTER — Encounter: Payer: Self-pay | Admitting: Student in an Organized Health Care Education/Training Program

## 2021-11-20 ENCOUNTER — Ambulatory Visit
Admission: RE | Admit: 2021-11-20 | Discharge: 2021-11-20 | Disposition: A | Discharge status: Home / Self Care | Payer: No Typology Code available for payment source | Source: Ambulatory Visit | Attending: Student in an Organized Health Care Education/Training Program | Admitting: Student in an Organized Health Care Education/Training Program

## 2021-11-20 ENCOUNTER — Ambulatory Visit
Admission: RE | Admit: 2021-11-20 | Discharge: 2021-11-20 | Disposition: A | Discharge status: Home / Self Care | Payer: No Typology Code available for payment source | Attending: Student in an Organized Health Care Education/Training Program | Admitting: Student in an Organized Health Care Education/Training Program

## 2021-11-20 ENCOUNTER — Ambulatory Visit
Payer: No Typology Code available for payment source | Attending: Student in an Organized Health Care Education/Training Program | Admitting: Student in an Organized Health Care Education/Training Program

## 2021-11-20 VITALS — BP 133/98 | HR 115 | Temp 97.2°F | Resp 16 | Ht 71.0 in | Wt 206.0 lb

## 2021-11-20 DIAGNOSIS — M47816 Spondylosis without myelopathy or radiculopathy, lumbar region: Secondary | ICD-10-CM | POA: Diagnosis present

## 2021-11-20 DIAGNOSIS — M5136 Other intervertebral disc degeneration, lumbar region: Secondary | ICD-10-CM | POA: Diagnosis present

## 2021-11-20 DIAGNOSIS — G8929 Other chronic pain: Secondary | ICD-10-CM

## 2021-11-20 DIAGNOSIS — M5416 Radiculopathy, lumbar region: Secondary | ICD-10-CM | POA: Diagnosis present

## 2021-11-20 DIAGNOSIS — M5442 Lumbago with sciatica, left side: Secondary | ICD-10-CM | POA: Insufficient documentation

## 2021-11-20 DIAGNOSIS — G894 Chronic pain syndrome: Secondary | ICD-10-CM | POA: Insufficient documentation

## 2021-11-20 MED ORDER — GABAPENTIN 300 MG PO CAPS: 300.0000 mg | ORAL_CAPSULE | Freq: Two times a day (BID) | ORAL | 0 refills | Status: DC

## 2021-11-20 NOTE — Telephone Encounter (Signed)
Left message for patient to call, please inform him of the following regarding scheduled surgery:  ? ?Pre-Admission date/time, COVID Testing date and Surgery date. ? ?Surgery Date: 12/20/21 ?Preadmission Testing Date: 12/13/21 (phone 8a-1p) ?Covid Testing Date: Not needed.    ? ?Also patient will need to call at [phone], between 1-3:00pm the day before surgery, to find out what time to arrive for surgery.   ? ?

## 2021-11-20 NOTE — Progress Notes (Signed)
Safety precautions to be maintained throughout the outpatient stay will include: orient to surroundings, keep bed in low position, maintain call bell within reach at all times, provide assistance with transfer out of bed and ambulation.  

## 2021-11-20 NOTE — Progress Notes (Signed)
Patient: Peter Webster  Service Category: E/M  Provider: Gillis Santa, MD  ?DOB: 1940/11/21  DOS: 11/20/2021  Referring Provider: [facility]  ?MRN: [id number]  Setting: Ambulatory outpatient  PCP: [place]  ?Type: New Patient  Specialty: Interventional Pain Management    ?Location: Office  Delivery: Face-to-face    ? ?Primary Reason(s) for Visit: Encounter for initial evaluation of one or more chronic problems (new to examiner) potentially causing chronic pain, and posing a threat to normal musculoskeletal function. (Level of risk: High) ?CC: Back Pain, Knee Pain (bilat), Foot Pain (bilat), Hand Pain (bilat), and Migraine ? ?HPI  ?Peter Webster is a 81 y.o. year old, male patient, who comes for the first time to our practice referred by Beverly for our initial evaluation of his chronic pain. He has Right inguinal hernia; Acute kidney failure, unspecified ([place]); Multiple myeloma not having achieved remission ([place]); Thrombocytopenia ([place]); Elevated prostate specific antigen (PSA); Encounter for antineoplastic immunotherapy; Wedge compression fracture of t11-T12 vertebra, sequela; Bladder cancer ([place]); Incisional hernia, without obstruction or gangrene; Chronic bilateral low back pain with left-sided sciatica; Lumbar degenerative disc disease; Lumbar facet arthropathy; Chronic radicular lumbar pain; and Chronic pain syndrome on their problem list. Today he comes in for evaluation of his Back Pain, Knee Pain (bilat), Foot Pain (bilat), Hand Pain (bilat), and Migraine ? ?Pain Assessment: ?Location: Lower Back ?Radiating: through left hip down lateral side of left leg to foot including bottom of foot (numbness) and toes ?Onset: More than a month ago ?Duration: Chronic pain ?Quality: Burning, Constant, Numbness, Cramping ?Severity: 8 /10 (subjective, self-reported pain score)  ?Effect on ADL: limits daily activities ?Timing: Constant ?Modifying factors: Tylenol helps "slightly" ?BP: (!) 133/98  HR:  (!) 115 ? ?Onset and Duration: Present longer than 3 months ?Cause of pain: Unknown ?Severity: Getting worse ?Timing: Not influenced by the time of the day ?Aggravating Factors: Bending, Kneeling, Lifiting, Nerve blocks, Prolonged sitting, Squatting, Stooping , Walking, Walking uphill, and Walking downhill ?Alleviating Factors: Acupuncture and Sleeping ?Associated Problems: Day-time cramps, Night-time cramps, Depression, Fatigue, Numbness, Spasms, Tingling, Weakness, and Pain that does not allow patient to sleep ?Quality of Pain: Burning, Constant, Intermittent, Cramping, Disabling, Itching, Pulsating, Sharp, Shooting, Throbbing, and Tingling ?Previous Examinations or Tests: Bone scan, CT scan, and MRI scan ?Previous Treatments: Chiropractic manipulations, Steroid treatments by mouth, Strengthening exercises, and Stretching exercises ? ? ?Peter Webster is a very pleasant 81 year old male who is accompanied today by his wife for a chief complaint of low back pain with radiation into his left posterior lateral thigh down to his calf and occasionally his ankle in a dermatomal fashion.  He also endorses burning and tingling of both of his feet.  He also has chronic hand and knee pain.  He also deals with migraines.  He was being managed at the [place].  He is currently on gabapentin 300 mg nightly with limited response.  He also takes tizanidine as needed for muscle spasms which has not been very helpful.  He is also on acetaminophen as needed.  He does have a history of shingles in the past but does not have any history of postherpetic neuralgia.  He has done chiropractic therapy in the past but has not done formal physical therapy.  He is not a diabetic.  He is not on any blood thinners.  Of note he does have a history of multiple myeloma. ? ?Historic Controlled Substance Pharmacotherapy Review  ? ?Historical Monitoring: ?The patient  reports no history of  drug use. ?List of all UDS Test(s): ?No results found for: MDMA,  COCAINSCRNUR, [place], [place], [place]QU[place], [place], [place] ?List of other Serum/Urine Drug Screening Test(s):  ?No results found for: AMPHSCRSER, [place], [place], [place], [place], [place], [place], [place], [place], [place], [place], [place], [place], [place] ?Historical Background Evaluation: ?[place] PMP: PDMP not reviewed this encounter. Online review of the past 71-month period conducted.             ? ?[place] Department of public safety, offender search: Editor, commissioning Information) Non-contributory ?Risk Assessment Profile: ?Aberrant behavior: None observed or detected today ?Risk factors for fatal opioid overdose: None identified today ?Fatal overdose hazard ratio (HR): Calculation deferred ?Non-fatal overdose hazard ratio (HR): Calculation deferred ?Risk of opioid abuse or dependence: 0.7-3.0% with doses ? 36 MME/day and 6.1-26% with doses ? 120 MME/day. ?Substance use disorder (SUD) risk level: See below ?Personal History of Substance Abuse (SUD-Substance use disorder):  ?Alcohol: Negative  ?Illegal Drugs: Negative  ?Rx Drugs: Negative  ?ORT Risk Level calculation: Low Risk ? Opioid Risk Tool - 11/20/21 1323   ? ?  ? Family History of Substance Abuse  ? Alcohol Negative   ? Illegal Drugs Negative   ? Rx Drugs Negative   ?  ? Personal History of Substance Abuse  ? Alcohol Negative   ? Illegal Drugs Negative   ? Rx Drugs Negative   ?  ? Age  ? Age between 59-45 years  No   ?  ? History of Preadolescent Sexual Abuse  ? History of Preadolescent Sexual Abuse Negative or Male   ?  ? Psychological Disease  ? Psychological Disease Negative   ? Depression Negative   ?  ? Total Score  ? Opioid Risk Tool Scoring 0   ? Opioid Risk Interpretation Low Risk   ? ?  ?  ? ?  ? ?ORT Scoring interpretation table:  ?Score <3 = Low Risk for SUD  ?Score between 4-7 = Moderate Risk for SUD  ?Score >8 = High Risk for Opioid Abuse  ? ?PHQ-2 Depression Scale:  Total score:   ? ?PHQ-2 Scoring interpretation table:  (Score and probability of major depressive disorder)  ?Score 0 = No depression  ?Score 1 = 15.4% Probability  ?Score 2 = 21.1% Probability  ?Score 3 = 38.4% Probability  ?Score 4 = 45.5% Probability  ?Score 5 = 56.4% Probability  ?Score 6 = 78.6% Probability  ? ?PHQ-9 Depression Scale:  Total score:   ? ?PHQ-9 Scoring interpretation table:  ?Score 0-4 = No depression  ?Score 5-9 = Mild depression  ?Score 10-14 = Moderate depression  ?Score 15-19 = Moderately severe depression  ?Score 20-27 = Severe depression (2.4 times higher risk of SUD and 2.89 times higher risk of overuse)  ? ?Pharmacologic Plan: As per protocol, I have not taken over any controlled substance management, pending the results of ordered tests and/or consults.            ?Initial impression: Pending review of available data and ordered tests. ? ?Meds  ? ?Current Outpatient Medications:  ?  acetaminophen (TYLENOL) 325 MG tablet, Take 650 mg by mouth every 6 (six) hours as needed., Disp: , Rfl:  ?  acyclovir (ZOVIRAX) 200 MG capsule, TAKE TWO CAPSULES BY MOUTH TWO TIMES A DAY SHINGLES PROPHYLAXIS, Disp: , Rfl:  ?  amLODipine (NORVASC) 10 MG tablet, TAKE ONE-HALF TABLET BY MOUTH EVERY DAY FOR BLOOD PRESSURE, Disp: , Rfl:  ?  Calcium Carb-Cholecalciferol 500-2.5 MG-MCG CHEW, Chew 2 tablets by mouth  daily., Disp: , Rfl:  ?  capsicum (ZOSTRIX) 0.075 % topical cream, APPLY SUFFICIENT AMOUNT TOPICALLY 3 TIMES A DAY AS NEEDED BACK SPASM AVOID GETTING ON FACE, IN EYES OR ON SENSITIVE AREAS. [place] HANDS THOROUGHLY AFTER USING., Disp: , Rfl:  ?  cetirizine (ZYRTEC) 10 MG tablet, Take 10 mg by mouth daily., Disp: , Rfl:  ?  Cholecalciferol 25 MCG (1000 UT) tablet, Take 1,000 Units by mouth daily., Disp: , Rfl:  ?  clobetasol ointment (TEMOVATE) 4.17 %, 1 application daily as needed (on face and Genitals)., Disp: , Rfl:  ?  cyanocobalamin 1000 MCG tablet, TAKE ONE TABLET BY MOUTH EVERY DAY TO PREVENT VITAMIN B12 DEFICIENCY, Disp: , Rfl:  ?  Docusate Sodium (DSS)  100 MG CAPS, TAKE ONE CAPSULE BY MOUTH TWO TIMES A DAY FOR PREVENTION OF CONSTIPATION, Disp: , Rfl:  ?  gabapentin (NEURONTIN) 300 MG capsule, Take 1 capsule (300 mg total) by mouth 2 (two) times daily., Disp: 60 ca

## 2021-11-20 NOTE — Telephone Encounter (Signed)
Patient calls back, he is now informed of all dates regarding his surgery and verbalized understanding.   

## 2021-11-26 ENCOUNTER — Telehealth: Payer: Self-pay | Admitting: Surgery

## 2021-11-26 LAB — COMPLIANCE DRUG ANALYSIS, UR

## 2021-11-26 NOTE — Telephone Encounter (Signed)
Updated information regarding rescheduled surgery at doctor's request.    Patient has been advised of Pre-Admission date/time, COVID Testing date and Surgery date.  Surgery Date: 12/18/21 Preadmission Testing Date: 12/13/21 (phone 8a-1p) Covid Testing Date: Not needed.    Patient has been made aware to call [phone], between 1-3:00pm the day before surgery, to find out what time to arrive for surgery.

## 2021-11-28 LAB — COMP. METABOLIC PANEL (12)
AST: 18 IU/L (ref 0–40)
Albumin/Globulin Ratio: 1.6 (ref 1.2–2.2)
Albumin: 4.6 g/dL (ref 3.6–4.6)
Alkaline Phosphatase: 82 IU/L (ref 44–121)
BUN/Creatinine Ratio: 11 (ref 10–24)
BUN: 22 mg/dL (ref 8–27)
Bilirubin Total: 0.5 mg/dL (ref 0.0–1.2)
Calcium: 10.1 mg/dL (ref 8.6–10.2)
Chloride: 104 mmol/L (ref 96–106)
Creatinine, Ser: 1.96 mg/dL — ABNORMAL HIGH (ref 0.76–1.27)
Globulin, Total: 2.8 g/dL (ref 1.5–4.5)
Glucose: 139 mg/dL — ABNORMAL HIGH (ref 70–99)
Potassium: 3.7 mmol/L (ref 3.5–5.2)
Sodium: 142 mmol/L (ref 134–144)
Total Protein: 7.4 g/dL (ref 6.0–8.5)
eGFR: 34 mL/min/{1.73_m2} — ABNORMAL LOW (ref >59)

## 2021-11-28 LAB — 25-HYDROXY VITAMIN D LCMS D2+D3
25-Hydroxy, Vitamin D-2: 1 ng/mL
25-Hydroxy, Vitamin D-3: 37 ng/mL
25-Hydroxy, Vitamin D: 38 ng/mL

## 2021-11-28 LAB — MAGNESIUM: Magnesium: 2 mg/dL (ref 1.6–2.3)

## 2021-11-28 LAB — VITAMIN B12: Vitamin B-12: 640 pg/mL (ref 232–1245)

## 2021-11-29 ENCOUNTER — Ambulatory Visit: Payer: No Typology Code available for payment source | Admitting: Podiatry

## 2021-12-13 ENCOUNTER — Encounter
Admission: RE | Admit: 2021-12-13 | Discharge: 2021-12-13 | Disposition: A | Discharge status: Home / Self Care | Payer: No Typology Code available for payment source | Source: Ambulatory Visit | Attending: Surgery | Admitting: Surgery

## 2021-12-13 DIAGNOSIS — Z01818 Encounter for other preprocedural examination: Secondary | ICD-10-CM

## 2021-12-13 DIAGNOSIS — D696 Thrombocytopenia, unspecified: Secondary | ICD-10-CM

## 2021-12-13 DIAGNOSIS — I10 Essential (primary) hypertension: Secondary | ICD-10-CM

## 2021-12-13 HISTORY — DX: Chronic pain syndrome: G89.4

## 2021-12-13 HISTORY — DX: Ventral hernia without obstruction or gangrene: K43.9

## 2021-12-13 HISTORY — DX: Thrombocytopenia, unspecified: D69.6

## 2021-12-13 HISTORY — DX: Unilateral inguinal hernia, without obstruction or gangrene, not specified as recurrent: K40.90

## 2021-12-13 HISTORY — DX: Other intervertebral disc degeneration, lumbar region without mention of lumbar back pain or lower extremity pain: M51.369

## 2021-12-13 HISTORY — DX: Acute kidney failure, unspecified: N17.9

## 2021-12-13 HISTORY — DX: Sleep apnea, unspecified: G47.30

## 2021-12-13 HISTORY — DX: Multiple myeloma not having achieved remission: C90.00

## 2021-12-13 HISTORY — DX: Headache, unspecified: R51.9

## 2021-12-13 HISTORY — DX: Wedge compression fracture of t11-T12 vertebra, sequela: S22.080S

## 2021-12-13 HISTORY — DX: Gastro-esophageal reflux disease without esophagitis: K21.9

## 2021-12-13 HISTORY — DX: Elevated prostate specific antigen (PSA): R97.20

## 2021-12-13 HISTORY — DX: Malignant neoplasm of bladder, unspecified: C67.9

## 2021-12-13 HISTORY — DX: Male erectile dysfunction, unspecified: N52.9

## 2021-12-13 HISTORY — DX: Other intervertebral disc degeneration, lumbar region: M51.36

## 2021-12-13 NOTE — Patient Instructions (Signed)
Your procedure is scheduled on:12-18-21 Wednesday Report to the Registration Desk on the 1st floor of the [facility].Then proceed to the 2nd floor Surgery Desk To find out your arrival time, please call [phone] between 1PM - 3PM on:12-17-21 Tuesday If your arrival time is 6:00 am, do not arrive prior to that time as the [facility] entrance doors do not open until 6:00 am.  REMEMBER: Instructions that are not followed completely may result in serious medical risk, up to and including death; or upon the discretion of your surgeon and anesthesiologist your surgery may need to be rescheduled.  Do not eat food OR drink any liquids after midnight the night before surgery.  No gum chewing, lozengers or hard candies.  TAKE THESE MEDICATIONS THE MORNING OF SURGERY WITH A SIP OF WATER: -acyclovir (ZOVIRAX) -amLODipine (NORVASC) -cetirizine (ZYRTEC)  -gabapentin (NEURONTIN) -omeprazole (PRILOSEC) -take one the night before and one on the morning of surgery - helps to prevent nausea after surgery.)  One week prior to surgery: Stop Anti-inflammatories (NSAIDS) such as Advil, Aleve, Ibuprofen, Motrin, Naproxen, Naprosyn and Aspirin based products such as Excedrin, [place]ys Powder, BC Powder.You may however, take Tylenol if needed for pain up until the day of surgery. Stop ANY OVER THE COUNTER supplements/vitamins NOW (12-13-21) until after surgery (Vitamin B12, Vitamin D and Calcium +D)  No Alcohol for 24 hours before or after surgery.  No Smoking including e-cigarettes for 24 hours prior to surgery.  No chewable tobacco products for at least 6 hours prior to surgery.  No nicotine patches on the day of surgery.  Do not use any "recreational" drugs for at least a week prior to your surgery.  Please be advised that the combination of cocaine and anesthesia may have negative outcomes, up to and including death. If you test positive for cocaine, your surgery will be cancelled.  On the morning of  surgery brush your teeth with toothpaste and water, you may rinse your mouth with mouthwash if you wish. Do not swallow any toothpaste or mouthwash.  Use CHG Soap as directed on instruction sheet.  Do not wear jewelry, make-up, hairpins, clips or nail polish.  Do not wear lotions, powders, or perfumes.   Do not shave body from the neck down 48 hours prior to surgery just in case you cut yourself which could leave a site for infection.  Also, freshly shaved skin may become irritated if using the CHG soap.  Contact lenses, hearing aids and dentures may not be worn into surgery.  Do not bring valuables to the hospital. [facility] is not responsible for any missing/lost belongings or valuables.   Bring your C-PAP to the hospital with you   Notify your doctor if there is any change in your medical condition (cold, fever, infection).  Wear comfortable clothing (specific to your surgery type) to the hospital.  After surgery, you can help prevent lung complications by doing breathing exercises.  Take deep breaths and cough every 1-2 hours. Your doctor may order a device called an Incentive Spirometer to help you take deep breaths. When coughing or sneezing, hold a pillow firmly against your incision with both hands. This is called "splinting." Doing this helps protect your incision. It also decreases belly discomfort.  If you are being admitted to the hospital overnight, leave your suitcase in the car. After surgery it may be brought to your room.  If you are being discharged the day of surgery, you will not be allowed to drive home.  You will need a responsible adult (18 years or older) to drive you home and stay with you that night.   If you are taking public transportation, you will need to have a responsible adult (18 years or older) with you. Please confirm with your physician that it is acceptable to use public transportation.   Please call the [facility] Dept. at [phone] if you have any questions about these instructions.  Surgery Visitation Policy:  Patients undergoing a surgery or procedure may have two family members or support persons with them as long as the person is not COVID-19 positive or experiencing its symptoms.

## 2021-12-16 ENCOUNTER — Encounter
Admission: RE | Admit: 2021-12-16 | Discharge: 2021-12-16 | Disposition: A | Discharge status: Home / Self Care | Payer: No Typology Code available for payment source | Source: Ambulatory Visit | Attending: Surgery | Admitting: Surgery

## 2021-12-16 DIAGNOSIS — Z01818 Encounter for other preprocedural examination: Secondary | ICD-10-CM | POA: Diagnosis not present

## 2021-12-16 DIAGNOSIS — I10 Essential (primary) hypertension: Secondary | ICD-10-CM | POA: Diagnosis not present

## 2021-12-16 DIAGNOSIS — D696 Thrombocytopenia, unspecified: Secondary | ICD-10-CM | POA: Insufficient documentation

## 2021-12-16 DIAGNOSIS — Z01810 Encounter for preprocedural cardiovascular examination: Secondary | ICD-10-CM

## 2021-12-16 LAB — CBC
HCT: 39.6 % (ref 39.0–52.0)
Hemoglobin: 13.1 g/dL (ref 13.0–17.0)
MCH: 30.6 pg (ref 26.0–34.0)
MCHC: 33.1 g/dL (ref 30.0–36.0)
MCV: 92.5 fL (ref 80.0–100.0)
Platelets: 174 10*3/uL (ref 150–400)
RBC: 4.28 MIL/uL (ref 4.22–5.81)
RDW: 14 % (ref 11.5–15.5)
WBC: 5.6 10*3/uL (ref 4.0–10.5)
nRBC: 0 % (ref 0.0–0.2)

## 2021-12-17 ENCOUNTER — Encounter: Payer: Self-pay | Admitting: Student in an Organized Health Care Education/Training Program

## 2021-12-17 ENCOUNTER — Ambulatory Visit
Payer: No Typology Code available for payment source | Attending: Student in an Organized Health Care Education/Training Program | Admitting: Student in an Organized Health Care Education/Training Program

## 2021-12-17 VITALS — BP 142/95 | HR 109 | Temp 97.3°F | Resp 16 | Ht 71.0 in | Wt 212.0 lb

## 2021-12-17 DIAGNOSIS — G894 Chronic pain syndrome: Secondary | ICD-10-CM | POA: Diagnosis present

## 2021-12-17 DIAGNOSIS — S22000S Wedge compression fracture of unspecified thoracic vertebra, sequela: Secondary | ICD-10-CM | POA: Diagnosis not present

## 2021-12-17 DIAGNOSIS — S22000A Wedge compression fracture of unspecified thoracic vertebra, initial encounter for closed fracture: Secondary | ICD-10-CM | POA: Insufficient documentation

## 2021-12-17 DIAGNOSIS — M47814 Spondylosis without myelopathy or radiculopathy, thoracic region: Secondary | ICD-10-CM | POA: Diagnosis not present

## 2021-12-17 DIAGNOSIS — M5136 Other intervertebral disc degeneration, lumbar region: Secondary | ICD-10-CM | POA: Insufficient documentation

## 2021-12-17 DIAGNOSIS — M47816 Spondylosis without myelopathy or radiculopathy, lumbar region: Secondary | ICD-10-CM | POA: Diagnosis present

## 2021-12-17 MED ORDER — GABAPENTIN 400 MG PO CAPS: 400.0000 mg | ORAL_CAPSULE | Freq: Two times a day (BID) | ORAL | 2 refills | Status: DC

## 2021-12-17 NOTE — Progress Notes (Signed)
Safety precautions to be maintained throughout the outpatient stay will include: orient to surroundings, keep bed in low position, maintain call bell within reach at all times, provide assistance with transfer out of bed and ambulation.  

## 2021-12-17 NOTE — Progress Notes (Signed)
PROVIDER NOTE: Information contained herein reflects review and annotations entered in association with encounter. Interpretation of such information and data should be left to medically-trained personnel. Information provided to patient can be located elsewhere in the medical record under "Patient Instructions". Document created using STT-dictation technology, any transcriptional errors that may result from process are unintentional.    Patient: Peter Webster  Service Category: E/M  Provider: Gillis Santa, MD  DOB: 08/09/1940  DOS: 12/17/2021  Specialty: Interventional Pain Management  MRN: [id number]  Setting: Ambulatory outpatient  [facility]P: [facility]  Type: Established Patient    Referring Provider: [facility]  Location: Office  Delivery: Face-to-face     Primary Reason(s) for Visit: Encounter for evaluation before starting new chronic pain management plan of care (Level of risk: moderate) CC: Back Pain (lower)  HPI  Peter Webster is a 81 y.o. year old, male patient, who comes today for a follow-up evaluation to review the test results and decide on a treatment plan. He has Right inguinal hernia; Acute kidney failure, unspecified ([place]); Multiple myeloma not having achieved remission ([place]); Thrombocytopenia ([place]); Elevated prostate specific antigen (PSA); Encounter for antineoplastic immunotherapy; Wedge compression fracture of t11-T12 vertebra, sequela; Bladder cancer ([place]); Incisional hernia, without obstruction or gangrene; Chronic bilateral low back pain with left-sided sciatica; Lumbar degenerative disc disease; Lumbar spondylosis; Chronic radicular lumbar pain; Chronic pain syndrome; Compression fracture of thoracic vertebra ([place]); and Thoracic spondylosis on their problem list. His primarily concern today is the Back Pain (lower)  Pain Assessment: Location: Lower Back Radiating: through left hip down side of left leg to calf Onset: More than a month ago Duration: Chronic  pain Quality: Burning, Constant, Numbness, Cramping Severity: 8 /10 (subjective, self-reported pain score)  Effect on ADL: limits daily activities Timing: Constant Modifying factors: Tylenol helps "slightly", gabapentin helps with feet numbness BP: (!) 142/95  HR: (!) 109  Peter Webster comes in today for a follow-up visit after his initial evaluation on 11/20/2021. Today we went over the results of his tests. These were explained in "Layman's terms". During today's appointment we went over my diagnostic impression, as well as the proposed treatment plan.  Patient comes today for his second patient visit.  We reviewed his x-ray studies which are below.  He has a thoracic and lumbar compression fracture as well as thoracic and lumbar spondylosis and facet arthropathy.  He also has lumbar degenerative disc disease.  I encouraged him to continue with stretching exercises that he has learned in the past.  He is finding benefit with gabapentin that was started at his last clinic visit at 300 mg twice a day.  No side effects.  Recommend dose escalation to 400 mg twice a day.   HPI from initial clinic visit 08-08-1940: Peter Webster is a very pleasant 81 year old male who is accompanied today by his wife for a chief complaint of low back pain with radiation into his left posterior lateral thigh down to his calf and occasionally his ankle in a dermatomal fashion.  He also endorses burning and tingling of both of his feet.  He also has chronic hand and knee pain.  He also deals with migraines.  He was being managed at the [place].  He is currently on gabapentin 300 mg nightly with limited response.  He also takes tizanidine as needed for muscle spasms which has not been very helpful.  He is also on acetaminophen as needed.  He does have a history of shingles in the past but does  not have any history of postherpetic neuralgia.  He has done chiropractic therapy in the past but has not done formal physical therapy.  He is not a  diabetic.  He is not on any blood thinners. Of note he does have a history of multiple myeloma.  A&P: I believe that the majority of the patient's low back and left leg pain is related to lumbar neuroforaminal stenosis, lumbar canal stenosis resulting in lumbar radicular pain.  He likely also has lumbar degenerative disc disease and lumbar facet arthropathy.  We will obtain x-rays of thoracic lumbar and SI joints.  May need to follow that up with MRI for further evaluation.  We will also obtain lab work as below in addition to magnesium and vitamin B12 and vitamin D as this could be contributors to the patient's chronic pain and fatigue.  He does have trouble sleeping at night so we discussed titrating his gabapentin dose so that he is taking 300 mg twice a day or 600 mg nightly which is up to him.  He will follow back up with me in 4 weeks to review his x-rays and to discuss treatment plan which may include further diagnostic work-up by way of MRI.  I will also refer the patient to physical therapy.  Controlled Substance Pharmacotherapy Assessment REMS (Risk Evaluation and Mitigation Strategy)    Monitoring: [place] PMP: PDMP not reviewed this encounter. Online review of the past 19-month period previously conducted. Not applicable at this point since we have not taken over the patient's medication management yet. List of other Serum/Urine Drug Screening Test(s):  No results found for: "AMPHSCRSER", "BARBSCRSER", "BENZOSCRSER", "COCAINSCRSER", "COCAINSCRNUR", "PCPSCRSER", "THCSCRSER", "THCU", "CANNABQUANT", "OPIATESCRSER", "OXYSCRSER", "PROPOXSCRSER", "ETH" List of all UDS test(s) done:  Lab Results  Component Value Date   SUMMARY Note 11/20/2021   Last UDS on record: Summary  Date Value Ref Range Status  11/20/2021 Note  Final    Comment:    ==================================================================== Compliance Drug Analysis,  Ur ==================================================================== Test                             Result       Flag       Units  Drug Present and Declared for Prescription Verification   Acetaminophen                  PRESENT      EXPECTED  Drug Absent but Declared for Prescription Verification   Gabapentin                     Not Detected UNEXPECTED   Tizanidine                     Not Detected UNEXPECTED    Tizanidine, as indicated in the declared medication list, is not    always detected even when used as directed.    Prochlorperazine               Not Detected UNEXPECTED   Lidocaine                      Not Detected UNEXPECTED    Lidocaine, as indicated in the declared medication list, is not    always detected even when used as directed.  ==================================================================== Test                      Result  Flag   Units      Ref Range   Creatinine              168              mg/dL      >=20 ==================================================================== Declared Medications:  The flagging and interpretation on this report are based on the  following declared medications.  Unexpected results may arise from  inaccuracies in the declared medications.   **Note: The testing scope of this panel includes these medications:   Gabapentin (Neurontin)  Prochlorperazine (Compazine)   **Note: The testing scope of this panel does not include small to  moderate amounts of these reported medications:   Acetaminophen (Tylenol)  Lidocaine (Xylocaine)  Tizanidine   **Note: The testing scope of this panel does not include the  following reported medications:   Acyclovir  Amlodipine (Norvasc)  Calcium  Capsaicin  Cetirizine (Zyrtec)  Cholecalciferol  Clobetasol  Cyanocobalamin  Docusate  Hydrocortisone  Omeprazole  Sildenafil (Viagra)  Simvastatin (Zocor)  Topical  Triamcinolone  (Kenalog) ==================================================================== For clinical consultation, please call [phone]. ====================================================================    UDS interpretation: No unexpected findings.          Medication Assessment Form: Not applicable. No opioids. Treatment compliance: Not applicable Risk Assessment Profile: Aberrant behavior: See initial evaluations. None observed or detected today Comorbid factors increasing risk of overdose: See initial evaluation. No additional risks detected today Opioid risk tool (ORT):     11/20/2021    1:23 PM  Opioid Risk   Alcohol 0  Illegal Drugs 0  Rx Drugs 0  Alcohol 0  Illegal Drugs 0  Rx Drugs 0  Age between 16-45 years  0  History of Preadolescent Sexual Abuse 0  Psychological Disease 0  Depression 0  Opioid Risk Tool Scoring 0  Opioid Risk Interpretation Low Risk    ORT Scoring interpretation table:  Score <3 = Low Risk for SUD  Score between 4-7 = Moderate Risk for SUD  Score >8 = High Risk for Opioid Abuse   Risk of substance use disorder (SUD): Low  Risk Mitigation Strategies:  Patient opioid safety counseling: No controlled substances prescribed. Patient-Prescriber Agreement (PPA): No agreement signed.  Controlled substance notification to other providers: None required. No opioid therapy.  Pharmacologic Plan: Non-opioid analgesic therapy offered. Interventional alternatives discussed.             Laboratory Chemistry Profile   Renal Lab Results  Component Value Date   BUN 22 11/20/2021   CREATININE 1.96 (H) 11/20/2021   BCR 11 11/20/2021     Electrolytes Lab Results  Component Value Date   NA 142 11/20/2021   K 3.7 11/20/2021   CL 104 11/20/2021   CALCIUM 10.1 11/20/2021   MG 2.0 11/20/2021     Hepatic Lab Results  Component Value Date   AST 18 11/20/2021   ALBUMIN 4.6 11/20/2021   ALKPHOS 82 11/20/2021     ID Lab Results  Component Value  Date   SARSCOV2NAA Not Detected 01/20/2019     Bone Lab Results  Component Value Date   25OHVITD1 38 11/20/2021   25OHVITD2 1.0 11/20/2021   25OHVITD3 37 11/20/2021     Endocrine Lab Results  Component Value Date   GLUCOSE 139 (H) 11/20/2021     Neuropathy Lab Results  Component Value Date   VITAMINB12 640 11/20/2021     CNS No results found for: "COLORCSF", "APPEARCSF", "RBCCOUNTCSF", "WBCCSF", "POLYSCSF", "LYMPHSCSF", "EOSCSF", "PROTEINCSF", "GLUCCSF", "JCVIRUS", "  CSFOLI", "IGGCSF", "LABACHR", "ACETBL"   Inflammation (CRP: Acute  ESR: Chronic) No results found for: "CRP", "ESRSEDRATE", "LATICACIDVEN"   Rheumatology No results found for: "RF", "ANA", "LABURIC", "URICUR", "LYMEIGGIGMAB", "LYMEABIGMQN", "HLAB27"   Coagulation Lab Results  Component Value Date   PLT 174 12/16/2021     Cardiovascular Lab Results  Component Value Date   HGB 13.1 12/16/2021   HCT 39.6 12/16/2021     Screening Lab Results  Component Value Date   SARSCOV2NAA Not Detected 01/20/2019     Cancer No results found for: "CEA", "CA125", "LABCA2"   Allergens No results found for: "ALMOND", "APPLE", "ASPARAGUS", "AVOCADO", "BANANA", "BARLEY", "BASIL", "BAYLEAF", "GREENBEAN", "LIMABEAN", "WHITEBEAN", "BEEFIGE", "REDBEET", "BLUEBERRY", "BROCCOLI", "CABBAGE", "MELON", "CARROT", "CASEIN", "CASHEWNUT", "CAULIFLOWER", "CELERY"     Note: Lab results reviewed.  Recent Diagnostic Imaging Review   Narrative CLINICAL DATA:  Chronic low back pain for 5 years  EXAM: LUMBAR SPINE - COMPLETE WITH BENDING VIEWS  COMPARISON:  05/15/2020  FINDINGS: Frontal, bilateral oblique, lateral neutral, lateral flexion, and lateral extension views of the lumbar spine are obtained. There are 5 non-rib-bearing lumbar type vertebral bodies in normal anatomic alignment. Compression deformities involving the superior endplates of B84 and L3 have developed since prior CT, and are otherwise age indeterminate.  Less than 25% loss of height. No retropulsion. No additional fractures. There is multilevel spondylosis and facet hypertrophy, greatest at the lumbosacral junction, without significant change since prior exam. No instability with flexion or extension. Sacroiliac joints are normal.  IMPRESSION: 1. Interval development of superior endplate compression deformities at T12 and L3. These are age indeterminate, but are likely chronic given the lack of visualized fracture line on today's study. If further imaging is required to assess acuity of fractures, bone scan or MRI could be considered. 2. Stable lower lumbar spondylosis and facet hypertrophy. 3. No instability with flexion or extension.   Electronically Signed By: Randa Ngo M.D. On: 11/21/2021 20:51   Sacroiliac Joint Imaging: Sacroiliac Joint DG: Results for orders placed during the hospital encounter of 11/20/21  DG Si Joints  Narrative CLINICAL DATA:  Chronic pain for 5 years  EXAM: BILATERAL SACROILIAC JOINTS - 3+ VIEW  COMPARISON:  None Available.  FINDINGS: Frontal and bilateral oblique views of the sacroiliac joints are obtained. Joint spaces are symmetrical. No erosive changes. Prominent spondylosis and facet hypertrophy at the lumbosacral junction. Mild symmetrical bilateral hip osteoarthritis.  IMPRESSION: 1. Unremarkable bilateral sacroiliac joints. 2. Degenerative changes at the lumbosacral junction and bilateral hips.   Electronically Signed By: Randa Ngo M.D. On: 11/21/2021 21:24   Complexity Note: Imaging results reviewed. Results shared with Mr. Calabrese, using Layman's terms.                         Meds   Current Outpatient Medications:    acetaminophen (TYLENOL) 325 MG tablet, Take 650 mg by mouth every 6 (six) hours as needed., Disp: , Rfl:    acyclovir (ZOVIRAX) 200 MG capsule, Take 200 mg by mouth 2 (two) times daily., Disp: , Rfl:    amLODipine (NORVASC) 10 MG tablet, Take 5 mg  by mouth every morning., Disp: , Rfl:    Calcium Carb-Cholecalciferol 500-2.5 MG-MCG CHEW, Chew 2 tablets by mouth daily., Disp: , Rfl:    cetirizine (ZYRTEC) 10 MG tablet, Take 10 mg by mouth every morning., Disp: , Rfl:    Cholecalciferol 25 MCG (1000 UT) tablet, Take 1,000 Units by mouth daily., Disp: , Rfl:  clobetasol ointment (TEMOVATE) 1.28 %, 1 application daily as needed (on face and Genitals)., Disp: , Rfl:    cyanocobalamin 1000 MCG tablet, Take 1,000 mcg by mouth daily., Disp: , Rfl:    Docusate Sodium (DSS) 100 MG CAPS, 1 capsule daily., Disp: , Rfl:    gabapentin (NEURONTIN) 400 MG capsule, Take 1 capsule (400 mg total) by mouth 2 (two) times daily., Disp: 60 capsule, Rfl: 2   hydrocortisone (ANUSOL-HC) 25 MG suppository, INSERT 1 SUPPOSITORY(IES) IN RECTUM EVERY DAY AS NEEDED, Disp: , Rfl:    hydrocortisone 2.5 % ointment, 1 application daily as needed (Scaling)., Disp: , Rfl:    lidocaine (XYLOCAINE) 5 % ointment, 1 application  daily as needed., Disp: , Rfl:    omeprazole (PRILOSEC) 20 MG capsule, Take 20 mg by mouth every morning., Disp: , Rfl:    prochlorperazine (COMPAZINE) 5 MG tablet, TAKE TWO TABLETS BY MOUTH EVERY 6 HOURS AS NEEDED *VCM ORDER* FOR NAUSEA/VOMITING., Disp: , Rfl:    sildenafil (VIAGRA) 100 MG tablet, Take 100 mg by mouth as needed for erectile dysfunction., Disp: , Rfl:    sildenafil (VIAGRA) 100 MG tablet, TAKE ONE-HALF TABLET BY MOUTH AS DIRECTED AS NEEDED 1 HOUR BEFORE SEXUAL ACTIVITY. DO NOT TAKE WITHIN 6 HOURS OF TERAZOSIN, PRAZOSIN OR DOXAZOSIN. DO NOT TAKE MORE THAN 1 DOSE WITHIN 24 HOURS (REPLACES VARDENAFIL) THIS IS A 90 DAY SUPPLY. WE DO NOT REPLACE., Disp: , Rfl:    simvastatin (ZOCOR) 80 MG tablet, Take 80 mg by mouth daily at 6 PM., Disp: , Rfl:    tiZANidine (ZANAFLEX) 4 MG tablet, Take 4 mg by mouth as needed., Disp: , Rfl:    triamcinolone ointment (KENALOG) 0.1 %, 1 application daily as needed (Dry skin)., Disp: , Rfl:    urea (CARMOL) 20 %  cream, 1 Application daily at 6 (six) AM., Disp: , Rfl:   ROS  Constitutional: Denies any fever or chills Gastrointestinal: No reported hemesis, hematochezia, vomiting, or acute GI distress Musculoskeletal:  low back, left leg pain Neurological: No reported episodes of acute onset apraxia, aphasia, dysarthria, agnosia, amnesia, paralysis, loss of coordination, or loss of consciousness  Allergies  Mr. Kraeger is allergic to lisinopril.  [place]  Drug: Mr. Gallentine  reports no history of drug use. Alcohol:  reports current alcohol use of about 1.0 standard drink of alcohol per week. Tobacco:  reports that he has never smoked. He has never used smokeless tobacco. Medical:  has a past medical history of Acute kidney failure ([place]), Allergy, Arthritis, Bladder cancer ([place]), Chronic pain syndrome, DDD (degenerative disc disease), lumbar, ED (erectile dysfunction), Elevated PSA, GERD (gastroesophageal reflux disease), Headache, Hernia of anterior abdominal wall, Hyperlipidemia, Hypertension, Multiple myeloma not having achieved remission ([place]), Right inguinal hernia, Sleep apnea, Thrombocytopenia ([place]), and Wedge compression fracture of t11-T12 vertebra, sequela. Surgical: Mr. Sperl  has a past surgical history that includes Colonoscopy (2019) and bilateral hernia (2018). Family: family history includes Cervical cancer in his mother; Stroke in his mother.  Constitutional Exam  General appearance: Well nourished, well developed, and well hydrated. In no apparent acute distress Vitals:   12/17/21 1440  BP: (!) 142/95  Pulse: (!) 109  Resp: 16  Temp: (!) 97.3 F (36.3 C)  TempSrc: Temporal  SpO2: 97%  Weight: 212 lb (96.2 kg)  Height: $Remove'5\' 11"'tgzeBHA$  (1.803 m)   BMI Assessment: Estimated body mass index is 29.57 kg/m as calculated from the following:   Height as of this encounter: $RemoveBeforeD'5\' 11"'GvvnoANqrpvWiq$  (1.803 m).  Weight as of this encounter: 212 lb (96.2 kg).  BMI interpretation table: BMI level Category Range  association with higher incidence of chronic pain  <18 kg/m2 Underweight   18.5-24.9 kg/m2 Ideal body weight   25-29.9 kg/m2 Overweight Increased incidence by 20%  30-34.9 kg/m2 Obese (Class I) Increased incidence by 68%  35-39.9 kg/m2 Severe obesity (Class II) Increased incidence by 136%  >40 kg/m2 Extreme obesity (Class III) Increased incidence by 254%   Patient's current BMI Ideal Body weight  Body mass index is 29.57 kg/m. Ideal body weight: 75.3 kg (166 lb 0.1 oz) Adjusted ideal body weight: 83.6 kg (184 lb 6.5 oz)   BMI Readings from Last 4 Encounters:  12/17/21 29.57 kg/m  11/20/21 28.73 kg/m  11/19/21 28.73 kg/m  06/13/21 31.94 kg/m   Wt Readings from Last 4 Encounters:  12/17/21 212 lb (96.2 kg)  11/20/21 206 lb (93.4 kg)  11/19/21 206 lb (93.4 kg)  06/13/21 229 lb (103.9 kg)    Psych/Mental status: Alert, oriented x 3 (person, place, & time)       Eyes: PERLA Respiratory: No evidence of acute respiratory distress  Thoracic Spine Area Exam  Skin & Axial Inspection: prominent thoracic Kyphosis Alignment: Asymmetric Functional ROM: Pain restricted ROM Stability: No instability detected Muscle Tone/Strength: Functionally intact. No obvious neuro-muscular anomalies detected. Sensory (Neurological): Musculoskeletal pain pattern Muscle strength & Tone: No palpable anomalies Lumbar Spine Area Exam  Skin & Axial Inspection: No masses, redness, or swelling Alignment: Levoscoliosis Functional ROM: Pain restricted ROM       Stability: No instability detected Muscle Tone/Strength: Functionally intact. No obvious neuro-muscular anomalies detected. Sensory (Neurological): Dermatomal pain pattern and neurogenic   Pain with lumbar extension and facet loading   Gait & Posture Assessment  Ambulation: Limited Gait: Age-related, senile gait pattern Posture: Difficulty standing up straight, due to pain  Lower Extremity Exam      Side: Right lower extremity   Side: Left  lower extremity  Stability: No instability observed           Stability: No instability observed          Skin & Extremity Inspection: Skin color, temperature, and hair growth are WNL. No peripheral edema or cyanosis. No masses, redness, swelling, asymmetry, or associated skin lesions. No contractures.   Skin & Extremity Inspection: Skin color, temperature, and hair growth are WNL. No peripheral edema or cyanosis. No masses, redness, swelling, asymmetry, or associated skin lesions. No contractures.  Functional ROM: Unrestricted ROM                   Functional ROM: Pain restricted ROM for hip and knee joints          Muscle Tone/Strength: Functionally intact. No obvious neuro-muscular anomalies detected.   Muscle Tone/Strength: Functionally intact. No obvious neuro-muscular anomalies detected.  Sensory (Neurological): Unimpaired         Sensory (Neurological): Dermatomal pain pattern        DTR: Patellar: deferred today Achilles: deferred today Plantar: deferred today   DTR: Patellar: deferred today Achilles: deferred today Plantar: deferred today  Palpation: No palpable anomalies   Palpation: No palpable anomalies     Assessment & Plan  Primary Diagnosis & Pertinent Problem List: The primary encounter diagnosis was Lumbar facet arthropathy. Diagnoses of Lumbar spondylosis, Compression fracture of thoracic vertebra, unspecified thoracic vertebral level, sequela, Lumbar degenerative disc disease, Thoracic spondylosis, and Chronic pain syndrome were also pertinent to this visit.  Visit Diagnosis: 1. Lumbar  facet arthropathy   2. Lumbar spondylosis   3. Compression fracture of thoracic vertebra, unspecified thoracic vertebral level, sequela   4. Lumbar degenerative disc disease   5. Thoracic spondylosis   6. Chronic pain syndrome    Problems updated and reviewed during this visit: Problem  Compression Fracture of Thoracic Vertebra (Hcc)  Thoracic Spondylosis  Lumbar Spondylosis     Plan of Care  Pharmacotherapy (Medications Ordered): Meds ordered this encounter  Medications   gabapentin (NEURONTIN) 400 MG capsule    Sig: Take 1 capsule (400 mg total) by mouth 2 (two) times daily.    Dispense:  60 capsule    Refill:  2    Fill one day early if pharmacy is closed on scheduled refill date. May substitute for generic if available.   Consider diagnostic lumbar facet medial branch nerve blocks in future.  Provider-requested follow-up: Return in about 3 months (around 03/19/2022) for Medication Management. Recent Visits Date Type Provider Dept  11/20/21 Office Visit Gillis Santa, MD [facility]  Showing recent visits within past 90 days and meeting all other requirements Today's Visits Date Type Provider Dept  12/17/21 Office Visit Gillis Santa, MD [facility]  Showing today's visits and meeting all other requirements Future Appointments Date Type Provider Dept  03/13/22 Appointment Gillis Santa, MD [facility]  Showing future appointments within next 90 days and meeting all other requirements  Primary Care Physician: [place] Note by: Gillis Santa, MD Date: 12/17/2021; Time: 3:12 PM

## 2021-12-18 ENCOUNTER — Encounter: Payer: Self-pay | Admitting: Surgery

## 2021-12-18 ENCOUNTER — Ambulatory Visit
Admission: RE | Admit: 2021-12-18 | Discharge: 2021-12-18 | Disposition: A | Discharge status: Home / Self Care | Payer: No Typology Code available for payment source | Attending: Surgery | Admitting: Surgery

## 2021-12-18 ENCOUNTER — Ambulatory Visit: Payer: No Typology Code available for payment source | Admitting: Urgent Care

## 2021-12-18 ENCOUNTER — Ambulatory Visit: Payer: No Typology Code available for payment source | Admitting: Anesthesiology

## 2021-12-18 ENCOUNTER — Other Ambulatory Visit: Payer: Self-pay | Admitting: Internal Medicine

## 2021-12-18 ENCOUNTER — Encounter
Admission: RE | Disposition: A | Discharge status: Home / Self Care | Payer: Self-pay | Source: Home / Self Care | Attending: Surgery

## 2021-12-18 DIAGNOSIS — G4730 Sleep apnea, unspecified: Secondary | ICD-10-CM | POA: Diagnosis not present

## 2021-12-18 DIAGNOSIS — K219 Gastro-esophageal reflux disease without esophagitis: Secondary | ICD-10-CM | POA: Diagnosis not present

## 2021-12-18 DIAGNOSIS — M1990 Unspecified osteoarthritis, unspecified site: Secondary | ICD-10-CM | POA: Diagnosis not present

## 2021-12-18 DIAGNOSIS — R519 Headache, unspecified: Secondary | ICD-10-CM | POA: Diagnosis not present

## 2021-12-18 DIAGNOSIS — K432 Incisional hernia without obstruction or gangrene: Secondary | ICD-10-CM | POA: Insufficient documentation

## 2021-12-18 DIAGNOSIS — Z8551 Personal history of malignant neoplasm of bladder: Secondary | ICD-10-CM | POA: Diagnosis not present

## 2021-12-18 DIAGNOSIS — E785 Hyperlipidemia, unspecified: Secondary | ICD-10-CM | POA: Diagnosis not present

## 2021-12-18 DIAGNOSIS — N529 Male erectile dysfunction, unspecified: Secondary | ICD-10-CM | POA: Diagnosis not present

## 2021-12-18 DIAGNOSIS — C9000 Multiple myeloma not having achieved remission: Secondary | ICD-10-CM | POA: Insufficient documentation

## 2021-12-18 DIAGNOSIS — Z9221 Personal history of antineoplastic chemotherapy: Secondary | ICD-10-CM | POA: Insufficient documentation

## 2021-12-18 DIAGNOSIS — I10 Essential (primary) hypertension: Secondary | ICD-10-CM | POA: Diagnosis not present

## 2021-12-18 HISTORY — PX: XI ROBOTIC ASSISTED VENTRAL HERNIA: SHX6789

## 2021-12-18 HISTORY — PX: INSERTION OF MESH: SHX5868

## 2021-12-18 SURGERY — REPAIR, HERNIA, VENTRAL, ROBOT-ASSISTED
Anesthesia: General

## 2021-12-18 MED ORDER — LACTATED RINGERS IV SOLN: INTRAVENOUS | Status: DC

## 2021-12-18 MED ORDER — ROCURONIUM BROMIDE 10 MG/ML (PF) SYRINGE
PREFILLED_SYRINGE | INTRAVENOUS | Status: AC
  Filled 2021-12-18: qty 10

## 2021-12-18 MED ORDER — ONDANSETRON HCL 4 MG/2ML IJ SOLN
INTRAMUSCULAR | Status: DC | PRN
  Administered 2021-12-18: 4 mg via INTRAVENOUS

## 2021-12-18 MED ORDER — OXYCODONE HCL 5 MG PO TABS
5.0000 mg | ORAL_TABLET | Freq: Once | ORAL | Status: AC
  Administered 2021-12-18: 5 mg via ORAL

## 2021-12-18 MED ORDER — CELECOXIB 200 MG PO CAPS
ORAL_CAPSULE | ORAL | Status: AC
  Administered 2021-12-18: 200 mg via ORAL
  Filled 2021-12-18: qty 1

## 2021-12-18 MED ORDER — OXYCODONE HCL 5 MG PO TABS: 5.0000 mg | ORAL_TABLET | Freq: Once | ORAL | Status: AC

## 2021-12-18 MED ORDER — FENTANYL CITRATE (PF) 100 MCG/2ML IJ SOLN
INTRAMUSCULAR | Status: AC
  Filled 2021-12-18: qty 2

## 2021-12-18 MED ORDER — DEXAMETHASONE SODIUM PHOSPHATE 10 MG/ML IJ SOLN
INTRAMUSCULAR | Status: AC
  Filled 2021-12-18: qty 1

## 2021-12-18 MED ORDER — SEVOFLURANE IN SOLN
RESPIRATORY_TRACT | Status: AC
  Filled 2021-12-18: qty 250

## 2021-12-18 MED ORDER — OXYCODONE HCL 5 MG PO TABS
ORAL_TABLET | ORAL | Status: AC
  Filled 2021-12-18: qty 1

## 2021-12-18 MED ORDER — CEFAZOLIN SODIUM-DEXTROSE 2-4 GM/100ML-% IV SOLN
2.0000 g | INTRAVENOUS | Status: AC
  Administered 2021-12-18: 2 g via INTRAVENOUS

## 2021-12-18 MED ORDER — ROCURONIUM BROMIDE 100 MG/10ML IV SOLN
INTRAVENOUS | Status: DC | PRN
  Administered 2021-12-18: 10 mg via INTRAVENOUS
  Administered 2021-12-18: 50 mg via INTRAVENOUS
  Administered 2021-12-18: 20 mg via INTRAVENOUS

## 2021-12-18 MED ORDER — DEXMEDETOMIDINE HCL IN NACL 200 MCG/50ML IV SOLN
INTRAVENOUS | Status: DC | PRN
  Administered 2021-12-18 (×4): 4 ug via INTRAVENOUS

## 2021-12-18 MED ORDER — BUPIVACAINE-EPINEPHRINE (PF) 0.25% -1:200000 IJ SOLN
INTRAMUSCULAR | Status: AC
  Filled 2021-12-18: qty 30

## 2021-12-18 MED ORDER — CHLORHEXIDINE GLUCONATE 0.12 % MT SOLN: 15.0000 mL | Freq: Once | OROMUCOSAL | Status: AC

## 2021-12-18 MED ORDER — ORAL CARE MOUTH RINSE: 15.0000 mL | Freq: Once | OROMUCOSAL | Status: AC

## 2021-12-18 MED ORDER — LIDOCAINE HCL (CARDIAC) PF 100 MG/5ML IV SOSY
PREFILLED_SYRINGE | INTRAVENOUS | Status: DC | PRN
  Administered 2021-12-18: 50 mg via INTRAVENOUS

## 2021-12-18 MED ORDER — GABAPENTIN 300 MG PO CAPS: 300.0000 mg | ORAL_CAPSULE | ORAL | Status: DC

## 2021-12-18 MED ORDER — CHLORHEXIDINE GLUCONATE CLOTH 2 % EX PADS: 6.0000 | MEDICATED_PAD | Freq: Once | CUTANEOUS | Status: DC

## 2021-12-18 MED ORDER — BUPIVACAINE-EPINEPHRINE 0.25% -1:200000 IJ SOLN
INTRAMUSCULAR | Status: DC | PRN
  Administered 2021-12-18: 30 mL

## 2021-12-18 MED ORDER — PHENYLEPHRINE HCL-NACL 20-0.9 MG/250ML-% IV SOLN
INTRAVENOUS | Status: AC
Start: 2021-12-18 — End: ?
  Filled 2021-12-18: qty 250

## 2021-12-18 MED ORDER — ONDANSETRON HCL 4 MG/2ML IJ SOLN
INTRAMUSCULAR | Status: AC
Start: 2021-12-18 — End: ?
  Filled 2021-12-18: qty 2

## 2021-12-18 MED ORDER — CHLORHEXIDINE GLUCONATE CLOTH 2 % EX PADS
6.0000 | MEDICATED_PAD | Freq: Once | CUTANEOUS | Status: AC
  Administered 2021-12-18: 6 via TOPICAL

## 2021-12-18 MED ORDER — HYDROCODONE-ACETAMINOPHEN 5-325 MG PO TABS: 1.0000 | ORAL_TABLET | Freq: Four times a day (QID) | ORAL | 0 refills | Status: DC | PRN

## 2021-12-18 MED ORDER — OXYCODONE HCL 5 MG PO TABS
ORAL_TABLET | ORAL | Status: AC
  Administered 2021-12-18: 5 mg via ORAL
  Filled 2021-12-18: qty 1

## 2021-12-18 MED ORDER — IBUPROFEN 800 MG PO TABS: 800.0000 mg | ORAL_TABLET | Freq: Three times a day (TID) | ORAL | 0 refills | Status: DC | PRN

## 2021-12-18 MED ORDER — GABAPENTIN 300 MG PO CAPS
ORAL_CAPSULE | ORAL | Status: AC
  Filled 2021-12-18: qty 1

## 2021-12-18 MED ORDER — FENTANYL CITRATE (PF) 100 MCG/2ML IJ SOLN
25.0000 ug | INTRAMUSCULAR | Status: DC | PRN
  Administered 2021-12-18: 25 ug via INTRAVENOUS
  Administered 2021-12-18: 50 ug via INTRAVENOUS

## 2021-12-18 MED ORDER — CEFAZOLIN SODIUM-DEXTROSE 2-4 GM/100ML-% IV SOLN
INTRAVENOUS | Status: AC
  Filled 2021-12-18: qty 100

## 2021-12-18 MED ORDER — DEXAMETHASONE SODIUM PHOSPHATE 10 MG/ML IJ SOLN
INTRAMUSCULAR | Status: DC | PRN
  Administered 2021-12-18: 10 mg via INTRAVENOUS

## 2021-12-18 MED ORDER — PROPOFOL 10 MG/ML IV BOLUS
INTRAVENOUS | Status: DC | PRN
  Administered 2021-12-18: 150 mg via INTRAVENOUS
  Administered 2021-12-18: 30 mg via INTRAVENOUS

## 2021-12-18 MED ORDER — ONDANSETRON HCL 4 MG/2ML IJ SOLN
4.0000 mg | Freq: Once | INTRAMUSCULAR | Status: DC | PRN
Start: 2021-12-18 — End: 2021-12-18

## 2021-12-18 MED ORDER — CELECOXIB 200 MG PO CAPS: 200.0000 mg | ORAL_CAPSULE | ORAL | Status: AC

## 2021-12-18 MED ORDER — CHLORHEXIDINE GLUCONATE 0.12 % MT SOLN
OROMUCOSAL | Status: AC
  Administered 2021-12-18: 15 mL via OROMUCOSAL
  Filled 2021-12-18: qty 15

## 2021-12-18 MED ORDER — DEXMEDETOMIDINE HCL IN NACL 80 MCG/20ML IV SOLN
INTRAVENOUS | Status: AC
  Filled 2021-12-18: qty 20

## 2021-12-18 MED ORDER — ACETAMINOPHEN 500 MG PO TABS
ORAL_TABLET | ORAL | Status: AC
  Administered 2021-12-18: 1000 mg via ORAL
  Filled 2021-12-18: qty 2

## 2021-12-18 MED ORDER — LIDOCAINE HCL (PF) 2 % IJ SOLN
INTRAMUSCULAR | Status: AC
  Filled 2021-12-18: qty 5

## 2021-12-18 MED ORDER — PROPOFOL 10 MG/ML IV BOLUS
INTRAVENOUS | Status: AC
  Filled 2021-12-18: qty 20

## 2021-12-18 MED ORDER — FENTANYL CITRATE (PF) 100 MCG/2ML IJ SOLN
INTRAMUSCULAR | Status: DC | PRN
Start: 2021-12-18 — End: 2021-12-18
  Administered 2021-12-18 (×2): 50 ug via INTRAVENOUS

## 2021-12-18 MED ORDER — SUGAMMADEX SODIUM 200 MG/2ML IV SOLN
INTRAVENOUS | Status: DC | PRN
  Administered 2021-12-18: 200 mg via INTRAVENOUS

## 2021-12-18 MED ORDER — BUPIVACAINE LIPOSOME 1.3 % IJ SUSP
INTRAMUSCULAR | Status: AC
  Filled 2021-12-18: qty 20

## 2021-12-18 MED ORDER — BUPIVACAINE LIPOSOME 1.3 % IJ SUSP: 20.0000 mL | Freq: Once | INTRAMUSCULAR | Status: DC

## 2021-12-18 MED ORDER — FENTANYL CITRATE (PF) 100 MCG/2ML IJ SOLN
INTRAMUSCULAR | Status: AC
  Administered 2021-12-18: 25 ug via INTRAVENOUS
  Filled 2021-12-18: qty 2

## 2021-12-18 MED ORDER — ACETAMINOPHEN 500 MG PO TABS: 1000.0000 mg | ORAL_TABLET | ORAL | Status: AC

## 2021-12-18 SURGICAL SUPPLY — 49 items
ADH SKN CLS APL DERMABOND .7 (GAUZE/BANDAGES/DRESSINGS) ×2
APL PRP STRL LF DISP 70% ISPRP (MISCELLANEOUS)
CHLORAPREP W/TINT 26 (MISCELLANEOUS) ×2 IMPLANT
COVER TIP SHEARS 8 DVNC (MISCELLANEOUS) ×2 IMPLANT
COVER TIP SHEARS 8MM DA VINCI (MISCELLANEOUS) ×1
COVER WAND RF STERILE (DRAPES) ×3 IMPLANT
DERMABOND ADVANCED (GAUZE/BANDAGES/DRESSINGS) ×1
DERMABOND ADVANCED .7 DNX12 (GAUZE/BANDAGES/DRESSINGS) ×2 IMPLANT
DRAPE ARM DVNC X/XI (DISPOSABLE) ×6 IMPLANT
DRAPE COLUMN DVNC XI (DISPOSABLE) ×2 IMPLANT
DRAPE DA VINCI XI ARM (DISPOSABLE) ×3
DRAPE DA VINCI XI COLUMN (DISPOSABLE) ×1
ELECT REM PT RETURN 9FT ADLT (ELECTROSURGICAL) ×3
ELECTRODE REM PT RTRN 9FT ADLT (ELECTROSURGICAL) ×2 IMPLANT
GLOVE ORTHO TXT STRL SZ7.5 (GLOVE) ×6 IMPLANT
GOWN STRL REUS W/ TWL LRG LVL3 (GOWN DISPOSABLE) ×2 IMPLANT
GOWN STRL REUS W/ TWL XL LVL3 (GOWN DISPOSABLE) ×4 IMPLANT
GOWN STRL REUS W/TWL LRG LVL3 (GOWN DISPOSABLE) ×6
GOWN STRL REUS W/TWL XL LVL3 (GOWN DISPOSABLE) ×6
GRASPER SUT TROCAR 14GX15 (MISCELLANEOUS) ×1 IMPLANT
IRRIGATION STRYKERFLOW (MISCELLANEOUS) IMPLANT
IRRIGATOR STRYKERFLOW (MISCELLANEOUS)
IV NS IRRIG 3000ML ARTHROMATIC (IV SOLUTION) IMPLANT
KIT PINK PAD W/HEAD ARE REST (MISCELLANEOUS) ×3
KIT PINK PAD W/HEAD ARM REST (MISCELLANEOUS) ×2 IMPLANT
KIT TURNOVER KIT A (KITS) ×3 IMPLANT
MANIFOLD NEPTUNE II (INSTRUMENTS) ×3 IMPLANT
MESH VENTRALIGHT ST 6X8 (Mesh Specialty) ×3 IMPLANT
MESH VENTRLGHT ELLIPSE 8X6XMFL (Mesh Specialty) IMPLANT
NDL INSUFFLATION 14GA 120MM (NEEDLE) ×2 IMPLANT
NEEDLE HYPO 22GX1.5 SAFETY (NEEDLE) ×3 IMPLANT
NEEDLE INSUFFLATION 14GA 120MM (NEEDLE) ×3 IMPLANT
NS IRRIG 500ML POUR BTL (IV SOLUTION) ×3 IMPLANT
PACK LAP CHOLECYSTECTOMY (MISCELLANEOUS) ×3 IMPLANT
SCISSORS METZENBAUM CVD 33 (INSTRUMENTS) IMPLANT
SEAL CANN UNIV 5-8 DVNC XI (MISCELLANEOUS) ×6 IMPLANT
SEAL XI 5MM-8MM UNIVERSAL (MISCELLANEOUS) ×3
SET TUBE SMOKE EVAC HIGH FLOW (TUBING) ×3 IMPLANT
SOLUTION ELECTROLUBE (MISCELLANEOUS) ×3 IMPLANT
SPIKE FLUID TRANSFER (MISCELLANEOUS) ×3 IMPLANT
SUT MNCRL 4-0 (SUTURE) ×3
SUT MNCRL 4-0 27XMFL (SUTURE) ×2
SUT STRATAFIX 0 PDS+ CT-2 23 (SUTURE) ×6
SUT VICRYL 0 AB UR-6 (SUTURE) ×3 IMPLANT
SUT VLOC 90 S/L VL9 GS22 (SUTURE) ×4 IMPLANT
SUTURE MNCRL 4-0 27XMF (SUTURE) ×2 IMPLANT
SUTURE STRATFX 0 PDS+ CT-2 23 (SUTURE) ×2 IMPLANT
TROCAR Z-THREAD FIOS 11X100 BL (TROCAR) ×3 IMPLANT
WATER STERILE IRR 500ML POUR (IV SOLUTION) ×2 IMPLANT

## 2021-12-18 NOTE — Interval H&P Note (Signed)
History and Physical Interval Note:  12/18/2021 8:12 AM  Peter Webster  has presented today for surgery, with the diagnosis of incisional hernia greater 3 cm.  The various methods of treatment have been discussed with the patient and family. After consideration of risks, benefits and other options for treatment, the patient has consented to  Procedure(s): XI ROBOTIC ASSISTED VENTRAL HERNIA, incisional (N/A) as a surgical intervention.  The patient's history has been reviewed, patient examined, no change in status, stable for surgery.  I have reviewed the patient's chart and labs.  Questions were answered to the patient's satisfaction.     Ronny Bacon

## 2021-12-18 NOTE — Progress Notes (Signed)
Patient awake/alert x4. Tolerating cola and crackers without event, Denies chest pain, shortness of breath. Patient states when he has any procedure his heart "acts up" Does not  have a cards provider. 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

## 2021-12-18 NOTE — Anesthesia Postprocedure Evaluation (Signed)
Anesthesia Post Note  Patient: Peter Webster  Procedure(s) Performed: XI ROBOTIC ASSISTED VENTRAL HERNIA, incisional INSERTION OF MESH  Patient location during evaluation: PACU Anesthesia Type: General Level of consciousness: awake and alert Pain management: pain level controlled Vital Signs Assessment: post-procedure vital signs reviewed and stable Respiratory status: spontaneous breathing, nonlabored ventilation, respiratory function stable and patient connected to nasal cannula oxygen Cardiovascular status: blood pressure returned to baseline and stable Postop Assessment: no apparent nausea or vomiting Anesthetic complications: no   No notable events documented.   Last Vitals:  Vitals:   12/18/21 1230 12/18/21 1253  BP: 131/79 (!) 146/92  Pulse: (!) 109 (!) 114  Resp: 19 15  Temp:  (!) 36.2 C  SpO2: 94% 97%    Last Pain:  Vitals:   12/18/21 1253  TempSrc: Temporal  PainSc: 0-No pain                 Martha Clan

## 2021-12-18 NOTE — Discharge Instructions (Signed)
AMBULATORY SURGERY  ?DISCHARGE INSTRUCTIONS ? ? ?The drugs that you were given will stay in your system until tomorrow so for the next 24 hours you should not: ? ?Drive an automobile ?Make any legal decisions ?Drink any alcoholic beverage ? ? ?You may resume regular meals tomorrow.  Today it is better to start with liquids and gradually work up to solid foods. ? ?You may eat anything you prefer, but it is better to start with liquids, then soup and crackers, and gradually work up to solid foods. ? ? ?Please notify your doctor immediately if you have any unusual bleeding, trouble breathing, redness and pain at the surgery site, drainage, fever, or pain not relieved by medication. ? ? ? ?Additional Instructions: ? ? ? ?Please contact your physician with any problems or Same Day Surgery at [phone], Monday through Friday 6 am to 4 pm, or [facility] Main number at [phone].  ?

## 2021-12-18 NOTE — Progress Notes (Signed)
Patient awake/alert x4.  Dr. Clayborn Bigness at bedside, ekg obtained, ok for discharge. Note to be placed by Dr. Clayborn Bigness. Patient has received '10mg'$  oxycodone po for abd discomfort r/t surgery.  Denies chest pain, shortness of breath.

## 2021-12-18 NOTE — Anesthesia Preprocedure Evaluation (Signed)
Anesthesia Evaluation  Patient identified by MRN, date of birth, ID band Patient awake    Reviewed: Allergy & Precautions, H&P , NPO status , Patient's Chart, lab work & pertinent test results, reviewed documented beta blocker date and time   History of Anesthesia Complications Negative for: history of anesthetic complications  Airway Mallampati: II  TM Distance: >3 FB Neck ROM: full    Dental  (+) Dental Advidsory Given, Missing, Caps   Pulmonary neg shortness of breath, sleep apnea and Continuous Positive Airway Pressure Ventilation , neg COPD, neg recent URI,    Pulmonary exam normal breath sounds clear to auscultation       Cardiovascular Exercise Tolerance: Good hypertension, (-) angina(-) Past MI and (-) Cardiac Stents Normal cardiovascular exam(-) dysrhythmias (-) Valvular Problems/Murmurs Rhythm:regular Rate:Normal     Neuro/Psych neg Seizures negative neurological ROS  negative psych ROS   GI/Hepatic Neg liver ROS, GERD  ,  Endo/Other  negative endocrine ROS  Renal/GU negative Renal ROS  negative genitourinary   Musculoskeletal   Abdominal   Peds  Hematology negative hematology ROS (+)   Anesthesia Other Findings Past Medical History: No date: Acute kidney failure (HCC)     Comment:  due to chemo No date: Allergy No date: Arthritis No date: Bladder cancer (HCC) No date: Chronic pain syndrome No date: DDD (degenerative disc disease), lumbar No date: ED (erectile dysfunction) No date: Elevated PSA No date: GERD (gastroesophageal reflux disease) No date: Headache     Comment:  due to sinuses No date: Hernia of anterior abdominal wall No date: Hyperlipidemia No date: Hypertension No date: Multiple myeloma not having achieved remission (HCC) No date: Right inguinal hernia No date: Sleep apnea     Comment:  uses CPAP No date: Thrombocytopenia (HCC) No date: Wedge compression fracture of t11-T12  vertebra, sequela   Reproductive/Obstetrics negative OB ROS                             Anesthesia Physical Anesthesia Plan  ASA: 2  Anesthesia Plan: General   Post-op Pain Management:    Induction: Intravenous  PONV Risk Score and Plan: 2 and Ondansetron, Dexamethasone and Treatment may vary due to age or medical condition  Airway Management Planned: Oral ETT  Additional Equipment:   Intra-op Plan:   Post-operative Plan: Extubation in OR  Informed Consent: I have reviewed the patients History and Physical, chart, labs and discussed the procedure including the risks, benefits and alternatives for the proposed anesthesia with the patient or authorized representative who has indicated his/her understanding and acceptance.     Dental Advisory Given  Plan Discussed with: Anesthesiologist, CRNA and Surgeon  Anesthesia Plan Comments:         Anesthesia Quick Evaluation

## 2021-12-18 NOTE — Op Note (Signed)
Robotic assisted laparoscopic ventral hernia Repair IPOM using  round ventralight BARD mesh   Pre-operative Diagnosis: Incisional hernia, estimated > 3cm fascial defect.    Post-operative Diagnosis: AA wall hernia with multiple fascial defects totaling greater than 10 cm.    Surgeon:  Ronny Bacon, M.D., FACS   Anesthesia: Gen. with endotracheal tube   Findings:  Multiple fascial defects, /swiss cheese defects totaling over an area of 11 cm CC, by  3 cm left to right.   Estimated Blood Loss: 15 cc   Complications: none           Procedure Details  The patient was seen again in the Holding Room. The benefits, complications, treatment options, and expected outcomes were discussed with the patient. The risks of bleeding, infection, recurrence of symptoms, failure to resolve symptoms, bowel injury, mesh placement, mesh infection, any of which could require further surgery were reviewed with the patient. The likelihood of improving the patient's symptoms with return to their baseline status is good.  The patient and/or family concurred with the proposed plan, giving informed consent.  The patient was taken to Operating Room, identified and the procedure verified.  A Time Out was held and the above information confirmed.   Prior to the induction of general anesthesia, antibiotic prophylaxis was administered. VTE prophylaxis was in place. General endotracheal anesthesia was then administered and tolerated well. After the induction, the abdomen was prepped with Chloraprep and draped in the sterile fashion. The patient was positioned in the supine position. After local infiltration of quarter percent Marcaine with epinephrine, stab incision was made left upper quadrant.  Just below the costal margin approximately midclavicular line the Veress needle is passed with sensation of the layers to penetrate the abdominal wall and into the peritoneum.  Saline drop test is confirmed peritoneal placement.   Insufflation is initiated with carbon dioxide to pressures of 15 mmHg. Marcaine quarter percent was used to inject all the incision sites. We used a left upper quadrant subcostal incision and using an optical FIOS 11 mm trocar, it was inserted with direct visualization and pneumoperitoneum obtained.  No hemodynamic compromise, no evidence of .   2 additional 8 mm ports were placed under direct visualization on the left lateral abdomen.  I visualized the hernia and there was a complex of Swiss cheese defects and attenuated fascia making a fascial defect measuring approximately 11-12 cm from cranial to caudad and approximately 3 cm maximal width distance with.     The robot was brought to the surgical field and docked in the standard fashion.  We made sure that all instrumentation was kept under direct vision at all times and there was no collision between the arms.  I scrubbed out and went to the console. Falciform and adjacent preperitoneal adipose and umbilical ligaments and hernia sac were taken down with electrocautery to allow adequate mesh placement to be secured to the fascial tissue. Confirm and measured that the defect was 11+ cm.  Using a 0 strata fix suture we closed the ventral defect primarily.   I elected to utilize a 15 cm x 20 cm oval Ventralight ST.  I used a second 0 strata fix suture to center it over the fascial closure.  The mesh was secured circumferentially to the abdominal wall using 2-0 V-lock in the standard fashion.   The mesh appeared well secured against the  abdominal wall. A second look laparoscopy revealed no evidence of intra-abdominal injury.    All the needles were  removed under direct visualization.  The instruments were removed and the robot was undocked.  The laparoscopic ports were removed under direct visualization and the pneumoperitoneum was deflated.   The fascial sutures approximated in the standard fashion,  utilizing the PMI under direct visualization.  Incisions were closed with  4-0 Monocryl   Dermabond was used to coat the skin.  Patient tolerated procedure well and there were no immediate complications. Needle and laparotomy counts were correct    Ronny Bacon M.D., [facility] 12/18/2021 10:28 AM

## 2021-12-18 NOTE — Transfer of Care (Signed)
Immediate Anesthesia Transfer of Care Note  Patient: Peter Webster  Procedure(s) Performed: XI ROBOTIC ASSISTED VENTRAL HERNIA, incisional INSERTION OF MESH  Patient Location: PACU  Anesthesia Type:General  Level of Consciousness: drowsy and patient cooperative  Airway & Oxygen Therapy: Patient Spontanous Breathing and Patient connected to face mask oxygen  Post-op Assessment: Report given to RN and Post -op Vital signs reviewed and stable  Post vital signs: Reviewed and stable  Last Vitals:  Vitals Value Taken Time  BP 107/75 12/18/21 1020  Temp 37.1 C 12/18/21 1020  Pulse 98 12/18/21 1027  Resp 39 12/18/21 1027  SpO2 98 % 12/18/21 1027  Vitals shown include unvalidated device data.  Last Pain:  Vitals:   12/18/21 1020  TempSrc:   PainSc: 0-No pain         Complications: No notable events documented.

## 2021-12-18 NOTE — Anesthesia Procedure Notes (Signed)
Procedure Name: Intubation Date/Time: 12/18/2021 8:24 AM  Performed by: Carmelina Paddock, RNPre-anesthesia Checklist: Patient identified, Patient being monitored, Timeout performed, Emergency Drugs available and Suction available Patient Re-evaluated:Patient Re-evaluated prior to induction Oxygen Delivery Method: Circle system utilized Preoxygenation: Pre-oxygenation with 100% oxygen Induction Type: IV induction Ventilation: Mask ventilation without difficulty Laryngoscope Size: Mac, 3 and 4 Grade View: Grade I Tube type: Oral Tube size: 7.5 mm Number of attempts: 1 Airway Equipment and Method: Stylet Placement Confirmation: ETT inserted through vocal cords under direct vision, positive ETCO2 and breath sounds checked- equal and bilateral Secured at: 22 cm Tube secured with: Tape Dental Injury: Teeth and Oropharynx as per pre-operative assessment

## 2021-12-18 NOTE — Progress Notes (Unsigned)
Brief cardiology consult note  Asked by anesthesia to evaluate the patient.  With EKG changes consistent with second-degree AV block patient hemodynamically stable asymptomatic  Impression Transient second-degree AV block Postop hernia Hyperlipidemia Hypertension Arthritis  Plan Recommend conservative therapy Block will resolve as anesthesia wears off Await currently is 110 appears to be sinus tach No indication for permanent pacemaker or high-level antiarrhythmic Patient should be safe to discharge home No change in current medical therapy Had the patient follow-up with primary physician or cardiology at the [facility] 1 to 2 weeks Patient is free to see me also if necessary 1 to 2 weeks Do not anticipate any long-term issues this all appears to be related to anesthesia  Full consult note to follow Lujean Amel MD Cardiology

## 2021-12-19 ENCOUNTER — Encounter: Payer: Self-pay | Admitting: Surgery

## 2021-12-20 ENCOUNTER — Ambulatory Visit: Payer: No Typology Code available for payment source | Admitting: Podiatry

## 2021-12-31 ENCOUNTER — Ambulatory Visit (INDEPENDENT_AMBULATORY_CARE_PROVIDER_SITE_OTHER): Payer: No Typology Code available for payment source | Admitting: Physician Assistant

## 2021-12-31 ENCOUNTER — Other Ambulatory Visit: Payer: Self-pay

## 2021-12-31 ENCOUNTER — Encounter: Payer: Self-pay | Admitting: Physician Assistant

## 2021-12-31 ENCOUNTER — Telehealth: Payer: Self-pay | Admitting: *Deleted

## 2021-12-31 VITALS — BP 133/86 | HR 106 | Temp 106.0°F | Ht 71.0 in | Wt 211.0 lb

## 2021-12-31 DIAGNOSIS — Z09 Encounter for follow-up examination after completed treatment for conditions other than malignant neoplasm: Secondary | ICD-10-CM

## 2021-12-31 DIAGNOSIS — K432 Incisional hernia without obstruction or gangrene: Secondary | ICD-10-CM

## 2021-12-31 MED ORDER — HYDROCODONE-ACETAMINOPHEN 5-325 MG PO TABS: 1.0000 | ORAL_TABLET | Freq: Four times a day (QID) | ORAL | 0 refills | Status: DC | PRN

## 2021-12-31 MED ORDER — ABDOMINAL BINDER/ELASTIC XL MISC: 1.0000 | Freq: Every day | 0 refills | Status: DC

## 2022-01-02 ENCOUNTER — Other Ambulatory Visit: Payer: Self-pay | Admitting: Physician Assistant

## 2022-01-02 MED ORDER — HYDROCODONE-ACETAMINOPHEN 5-325 MG PO TABS: 1.0000 | ORAL_TABLET | Freq: Four times a day (QID) | ORAL | 0 refills | Status: DC | PRN

## 2022-01-02 NOTE — Progress Notes (Signed)
Patient called stated [facility] did not have Hydrocodone from his recent refill on 06/27. That prescription was DC'd and new one sent to [facility] in [place], [place] on 06/29

## 2022-01-03 ENCOUNTER — Telehealth: Payer: Self-pay | Admitting: *Deleted

## 2022-01-03 NOTE — Telephone Encounter (Signed)
Spoke with patient to let him know that his Abdominal Coralee Pesa is ready to be picked up, it will be at the front desk

## 2022-01-23 ENCOUNTER — Encounter: Payer: Self-pay | Admitting: Physician Assistant

## 2022-01-23 ENCOUNTER — Ambulatory Visit (INDEPENDENT_AMBULATORY_CARE_PROVIDER_SITE_OTHER): Payer: No Typology Code available for payment source | Admitting: Physician Assistant

## 2022-01-23 VITALS — BP 117/69 | HR 93 | Temp 98.1°F | Wt 212.4 lb

## 2022-01-23 DIAGNOSIS — K432 Incisional hernia without obstruction or gangrene: Secondary | ICD-10-CM

## 2022-01-23 DIAGNOSIS — Z09 Encounter for follow-up examination after completed treatment for conditions other than malignant neoplasm: Secondary | ICD-10-CM

## 2022-01-23 NOTE — Patient Instructions (Signed)
If you have any concerns or questions, please feel free to call our office.   Ventral Hernia  A ventral hernia is a bulge of tissue from inside the abdomen that pushes through a weak area of the muscles that form the front wall of the abdomen. The tissues inside the abdomen are inside a sac (peritoneum). These tissues include the small intestine, large intestine, and the fatty tissue that covers the intestines (omentum). Sometimes, the bulge that forms a hernia contains intestines. Other hernias contain only fat. Ventral hernias do not go away without surgical treatment. There are several types of ventral hernias. You may have: A hernia at an incision site from previous abdominal surgery (incisional hernia). A hernia just above the belly button (epigastric hernia), or at the belly button (umbilical hernia). These types of hernias can develop from heavy lifting or straining. A hernia that comes and goes (reducible hernia). It may be visible only when you lift or strain. This type of hernia can be pushed back into the abdomen (reduced). A hernia that traps abdominal tissue inside the hernia (incarcerated hernia). This type of hernia does not reduce. A hernia that cuts off blood flow to the tissues inside the hernia (strangulated hernia). The tissues can start to die if this happens. This is a very painful bulge that cannot be reduced. A strangulated hernia is a medical emergency. What are the causes? This condition is caused by abdominal tissue putting pressure on an area of weakness in the abdominal muscles. What increases the risk? The following factors may make you more likely to develop this condition: Being age 60 or older. Being overweight or obese. Having had previous abdominal surgery, especially if there was an infection after surgery. Having had an injury to the abdominal wall. Frequently lifting or pushing heavy objects. Having had several pregnancies. Having a buildup of fluid inside  the abdomen (ascites). Straining to have a bowel movement or to urinate. Having frequent coughing episodes. What are the signs or symptoms? The only symptom of a ventral hernia may be a painless bulge in the abdomen. A reducible hernia may be visible only when you strain, cough, or lift. Other symptoms may include: Dull pain. A feeling of pressure. Signs and symptoms of a strangulated hernia may include: Increasing pain. Nausea and vomiting. Pain when pressing on the hernia. The skin over the hernia turning red or purple. Constipation. Blood in the stool (feces). How is this diagnosed? This condition may be diagnosed based on: Your symptoms. Your medical history. A physical exam. You may be asked to cough or strain while standing. These actions increase the pressure inside your abdomen and force the hernia through the opening in your muscles. Your health care provider may try to reduce the hernia by gently pushing the hernia back in. Imaging studies, such as an ultrasound or CT scan. How is this treated? This condition is treated with surgery. If you have a strangulated hernia, surgery is done as soon as possible. If your hernia is small and not incarcerated, you may be asked to lose some weight before surgery. Follow these instructions at home: Follow instructions from your health care provider about eating or drinking restrictions. If you are overweight, your health care provider may recommend that you increase your activity level and eat a healthier diet. Do not lift anything that is heavier than 10 lb (4.5 kg), or the limit that you are told, until your health care provider says that it is safe. Return to your   normal activities as told by your health care provider. Ask your health care provider what activities are safe for you. You may need to avoid activities that increase pressure on your hernia. Take over-the-counter and prescription medicines only as told by your health care  provider. Keep all follow-up visits. This is important. Contact a health care provider if: Your hernia gets larger. Your hernia becomes painful. Get help right away if: Your hernia becomes increasingly painful. You have pain along with any of the following: Changes in skin color in the area of the hernia. Nausea. Vomiting. Fever. These symptoms may represent a serious problem that is an emergency. Do not wait to see if the symptoms will go away. Get medical help right away. Call your local emergency services (911 in the [place] Do not drive yourself to the hospital. Summary A ventral hernia is a bulge of tissue from inside the abdomen that pushes through a weak area of the muscles that form the front wall of the abdomen. This condition is treated with surgery, which may be urgent depending on your hernia. Do not lift anything that is heavier than 10 lb (4.5 kg), and follow activity instructions from your health care provider. This information is not intended to replace advice given to you by your health care provider. Make sure you discuss any questions you have with your health care provider. Document Revised: 02/10/2020 Document Reviewed: 02/10/2020 Elsevier Patient Education  2023 Elsevier Inc.  

## 2022-01-23 NOTE — Progress Notes (Signed)
[facility] POST-OP OFFICE VISIT  01/23/2022  HPI: Peter Webster is a 81 y.o. male ~5 weeks s/p robotic assisted laparoscopic ventral hernia repair with Dr Christian Mate.   He is doing better Soreness when reaching or twisting but otherwise he is pain free No fever chills, nausea, emesis, or bowel changes Incisions are all healed He is using the abdominal binder sparingly No other complaints   Vital signs: BP 117/69   Pulse 93   Temp 98.1 F (36.7 C) (Oral)   Wt 212 lb 6.4 oz (96.3 kg)   SpO2 98%   BMI 29.62 kg/m    Physical Exam: Constitutional: Well appearing male, NAD Abdomen: Soft, non-tender, non-distended, no rebound/guarding Skin: Laparoscopic incisions are healing well, no erythema or drainage   Assessment/Plan: This is a 81 y.o. male ~5 weeks s/p robotic assisted laparoscopic ventral hernia repair   - Pain control prn  - Abdominal binder as needed  - Reviewed lifting restrictions; 1 more week - He can follow up on as needed basis; He understands to call with questions/concerns  -- Edison Simon, PA-C [facility] 01/23/2022, 2:19 PM M-F: 7am - 4pm

## 2022-01-31 DIAGNOSIS — M545 Low back pain, unspecified: Secondary | ICD-10-CM | POA: Insufficient documentation

## 2022-03-12 NOTE — Consult Note (Signed)
CARDIOLOGY CONSULT NOTE               Patient ID: Peter Webster MRN: [id number] DOB/AGE: 81/12/1940 81 y.o.  Admit date: 12/18/2021 Referring Physician Archer Asa. [place] Primary Physician Dr. Tommi Rumps Primary Cardiologist  Reason for Consultation abnormal EKG postop  HPI: Patient is a 81 year old male postop from general surgery procedure developed mild sinus arrhythmia extra P waves and possible second-degree AV block relatively asymptomatic denies any previous cardiac history no blackout spells or syncope no nausea vomiting.  Patient is postop during monitoring on telemetry he developed vague episodes of what appeared to be heart block but mostly sinus rhythm  Review of systems complete and found to be negative unless listed above     Past Medical History:  Diagnosis Date   Acute kidney failure ([place])    due to chemo   Allergy    Arthritis    Bladder cancer (HCC)    Chronic pain syndrome    DDD (degenerative disc disease), lumbar    ED (erectile dysfunction)    Elevated PSA    GERD (gastroesophageal reflux disease)    Headache    due to sinuses   Hernia of anterior abdominal wall    Hyperlipidemia    Hypertension    Multiple myeloma not having achieved remission ([place])    Right inguinal hernia    Sleep apnea    uses CPAP   Thrombocytopenia (HCC)    Wedge compression fracture of t11-T12 vertebra, sequela     Past Surgical History:  Procedure Laterality Date   bilateral hernia  2018   COLONOSCOPY  2019   INSERTION OF MESH  12/18/2021   Procedure: INSERTION OF MESH;  Surgeon: Ronny Bacon, MD;  Location: [facility] ORS;  Service: General;;   XI ROBOTIC ASSISTED VENTRAL HERNIA N/A 12/18/2021   Procedure: XI ROBOTIC ASSISTED VENTRAL HERNIA, incisional;  Surgeon: Ronny Bacon, MD;  Location: [facility] ORS;  Service: General;  Laterality: N/A;    No medications prior to admission.   Social History   Socioeconomic History   Marital status: Married    Spouse name:  Not on file   Number of children: Not on file   Years of education: Not on file   Highest education level: Not on file  Occupational History   Not on file  Tobacco Use   Smoking status: Never   Smokeless tobacco: Never  Vaping Use   Vaping Use: Never used  Substance and Sexual Activity   Alcohol use: Yes    Alcohol/week: 1.0 standard drink of alcohol    Types: 1 Cans of beer per week    Comment: occ beer   Drug use: No   Sexual activity: Not on file  Other Topics Concern   Not on file  Social History Narrative   Not on file   Social Determinants of Health   Financial Resource Strain: Not on file  Food Insecurity: Not on file  Transportation Needs: Not on file  Physical Activity: Not on file  Stress: Not on file  Social Connections: Not on file  Intimate Partner Violence: Not on file    Family History  Problem Relation Age of Onset   Stroke Mother    Cervical cancer Mother       Review of systems complete and found to be negative unless listed above      PHYSICAL EXAM  General: Well developed, well nourished, in no acute distress HEENT:  Normocephalic and atramatic Neck:  No JVD.  Lungs: Clear bilaterally to auscultation and percussion. Heart: HRRR . Normal S1 and S2 without gallops or murmurs.  Abdomen: Bowel sounds are positive, abdomen soft and non-tender  Msk:  Back normal, normal gait. Normal strength and tone for age. Extremities: No clubbing, cyanosis or edema.   Neuro: Alert and oriented X 3. Psych:  Good affect, responds appropriately  Labs:   Lab Results  Component Value Date   WBC 5.6 12/16/2021   HGB 13.1 12/16/2021   HCT 39.6 12/16/2021   MCV 92.5 12/16/2021   PLT 174 12/16/2021   No results for input(s): "NA", "K", "CL", "CO2", "BUN", "CREATININE", "CALCIUM", "PROT", "BILITOT", "ALKPHOS", "ALT", "AST", "GLUCOSE" in the last 168 hours.  Invalid input(s): "LABALBU" No results found for: "CKTOTAL", "CKMB", "CKMBINDEX", "TROPONINI" No  results found for: "CHOL" No results found for: "HDL" No results found for: "LDLCALC" No results found for: "TRIG" No results found for: "CHOLHDL" No results found for: "LDLDIRECT"    Radiology: No results found.  EKG: First-degree AV block rate of about 65 possible transient second-degree narrow complex EKG was independently reviewed and read by me  ASSESSMENT AND PLAN:  Impression Transient second-degree AV block Postop hernia Hyperlipidemia Hypertension Arthritis   Plan Recommend conservative therapy Block will resolve as anesthesia wears off Await currently is 110 appears to be sinus tach No indication for permanent pacemaker or high-level antiarrhythmic Patient should be safe to discharge home No change in current medical therapy Had the patient follow-up with primary physician or cardiology at the [facility] 1 to 2 weeks Patient is free to see me also if necessary 1 to 2 weeks Do not anticipate any long-term issues this all appears to be related to anesthesia  Signed: Yolonda Kida MD, 03/12/2022, 10:48 PM

## 2022-03-13 ENCOUNTER — Ambulatory Visit
Payer: No Typology Code available for payment source | Attending: Student in an Organized Health Care Education/Training Program | Admitting: Student in an Organized Health Care Education/Training Program

## 2022-03-13 ENCOUNTER — Encounter: Payer: Self-pay | Admitting: Student in an Organized Health Care Education/Training Program

## 2022-03-13 VITALS — BP 131/73 | HR 92 | Temp 97.6°F | Resp 18 | Ht 71.0 in | Wt 215.0 lb

## 2022-03-13 DIAGNOSIS — G894 Chronic pain syndrome: Secondary | ICD-10-CM | POA: Insufficient documentation

## 2022-03-13 DIAGNOSIS — S22000S Wedge compression fracture of unspecified thoracic vertebra, sequela: Secondary | ICD-10-CM | POA: Insufficient documentation

## 2022-03-13 DIAGNOSIS — M5136 Other intervertebral disc degeneration, lumbar region: Secondary | ICD-10-CM | POA: Insufficient documentation

## 2022-03-13 DIAGNOSIS — M47816 Spondylosis without myelopathy or radiculopathy, lumbar region: Secondary | ICD-10-CM | POA: Insufficient documentation

## 2022-03-13 DIAGNOSIS — M47814 Spondylosis without myelopathy or radiculopathy, thoracic region: Secondary | ICD-10-CM | POA: Insufficient documentation

## 2022-03-13 MED ORDER — GABAPENTIN 600 MG PO TABS: 600.0000 mg | ORAL_TABLET | Freq: Two times a day (BID) | ORAL | 4 refills | Status: DC

## 2022-03-13 NOTE — Progress Notes (Signed)
PROVIDER NOTE: Information contained herein reflects review and annotations entered in association with encounter. Interpretation of such information and data should be left to medically-trained personnel. Information provided to patient can be located elsewhere in the medical record under "Patient Instructions". Document created using STT-dictation technology, any transcriptional errors that may result from process are unintentional.    Patient: Peter Webster  Service Category: E/M  Provider: Gillis Santa, MD  DOB: 05-10-1941  DOS: 03/13/2022  Referring Provider: [facility]  MRN: [id number]  Specialty: Interventional Pain Management  [facility]P: [facility]  Type: Established Patient  Setting: Ambulatory outpatient    Location: Office  Delivery: Face-to-face     HPI  [name], a 81 y.o. year old male, is here today because of his Lumbar facet arthropathy [M47.816]. [name] primary complain today is low back and Foot Pain Last encounter: My last encounter with him was on 12/17/2021. Pertinent problems: [name] has Multiple myeloma not having achieved remission ([place]); Thrombocytopenia ([place]); Wedge compression fracture of t11-T12 vertebra, sequela; Chronic bilateral low back pain with left-sided sciatica; Lumbar degenerative disc disease; Lumbar spondylosis; Chronic radicular lumbar pain; Chronic pain syndrome; Compression fracture of thoracic vertebra ([place]); and Thoracic spondylosis on their pertinent problem list. Pain Assessment: Severity of Chronic pain is reported as a 8 /10. Location: Foot Right, Left/denies. Onset: More than a month ago. Quality: Numbness, Sharp. Timing: Constant. Modifying factor(s): meds. Vitals:  height is $RemoveB'5\' 11"'qydVZnkD$  (1.803 m) and weight is 215 lb (97.5 kg). His temperature is 97.6 F (36.4 C). His blood pressure is 131/73 and his pulse is 92. His respiration is 18 and oxygen saturation is 96%.   Reason for encounter: follow-up evaluation.  Since the  patient's last clinic visit with me, he saw Dr. Manuella Ghazi with neurology and had a nerve conduction velocity/EMG study done.  He is still awaiting the results. We reviewed his lumbar spine x-rays which show lumbar facet arthropathy and spondylosis at L3, L4, L5.  He also has a new compression fracture at T12 and L3.  He notes a reduction in his height over the last year and a half which I told him could be a result of the compression fracture at T12.  We will continue to monitor. We discussed increasing his gabapentin from 400 mg twice a day to 600 mg twice a day.  He is noticing some benefit with gabapentin at 400 mg twice daily and is not endorsing any side effects. We also discussed a trial of diagnostic lumbar facet medial branch nerve blocks to help address his low back pain related to facet arthropathy.   12/17/21 Patient comes today for his second patient visit.  We reviewed his x-ray studies which are below.  He has a thoracic and lumbar compression fracture as well as thoracic and lumbar spondylosis and facet arthropathy.  He also has lumbar degenerative disc disease.  I encouraged him to continue with stretching exercises that he has learned in the past.  He is finding benefit with gabapentin that was started at his last clinic visit at 300 mg twice a day.  No side effects.  Recommend dose escalation to 400 mg twice a day.     HPI from initial clinic visit [birth year]: Peter Webster is a very pleasant 81 year old male who is accompanied today by his wife for a chief complaint of low back pain with radiation into his left posterior lateral thigh down to his calf and occasionally his ankle in a dermatomal fashion.  He  also endorses burning and tingling of both of his feet.  He also has chronic hand and knee pain.  He also deals with migraines.  He was being managed at the [place].  He is currently on gabapentin 300 mg nightly with limited response.  He also takes tizanidine as needed for muscle spasms which has not  been very helpful.  He is also on acetaminophen as needed.  He does have a history of shingles in the past but does not have any history of postherpetic neuralgia.  He has done chiropractic therapy in the past but has not done formal physical therapy.  He is not a diabetic.  He is not on any blood thinners. Of note he does have a history of multiple myeloma.   A&P: I believe that the majority of the patient's low back and left leg pain is related to lumbar neuroforaminal stenosis, lumbar canal stenosis resulting in lumbar radicular pain.  He likely also has lumbar degenerative disc disease and lumbar facet arthropathy.  We will obtain x-rays of thoracic lumbar and SI joints.  May need to follow that up with MRI for further evaluation.  We will also obtain lab work as below in addition to magnesium and vitamin B12 and vitamin D as this could be contributors to the patient's chronic pain and fatigue.  He does have trouble sleeping at night so we discussed titrating his gabapentin dose so that he is taking 300 mg twice a day or 600 mg nightly which is up to him.  He will follow back up with me in 4 weeks to review his x-rays and to discuss treatment plan which may include further diagnostic work-up by way of MRI.  I will also refer the patient to physical therapy.    ROS  Constitutional: Denies any fever or chills Gastrointestinal: No reported hemesis, hematochezia, vomiting, or acute GI distress Musculoskeletal:  Low back and bilateral foot Neurological: No reported episodes of acute onset apraxia, aphasia, dysarthria, agnosia, amnesia, paralysis, loss of coordination, or loss of consciousness  Medication Review  Abdominal Binder/Elastic XL, Calcium Carb-Cholecalciferol, DSS, acetaminophen, acyclovir, amLODipine, cetirizine, clobetasol ointment, gabapentin, hydrocortisone, ibuprofen, lidocaine, omeprazole, sildenafil, simvastatin, tiZANidine, triamcinolone ointment, and urea  History Review  Allergy:  [name] is allergic to lisinopril. Drug: [name]  reports no history of drug use. Alcohol:  reports current alcohol use of about 1.0 standard drink of alcohol per week. Tobacco:  reports that he has never smoked. He has never used smokeless tobacco. Social: Mr. Marchiano  reports that he has never smoked. He has never used smokeless tobacco. He reports current alcohol use of about 1.0 standard drink of alcohol per week. He reports that he does not use drugs. Medical:  has a past medical history of Acute kidney failure ([place]), Allergy, Arthritis, Bladder cancer ([place]), Chronic pain syndrome, DDD (degenerative disc disease), lumbar, ED (erectile dysfunction), Elevated PSA, GERD (gastroesophageal reflux disease), Headache, Hernia of anterior abdominal wall, Hyperlipidemia, Hypertension, Multiple myeloma not having achieved remission ([place]), Right inguinal hernia, Sleep apnea, Thrombocytopenia ([place]), and Wedge compression fracture of t11-T12 vertebra, sequela. Surgical: Peter Webster  has a past surgical history that includes Colonoscopy (2019); bilateral hernia (2018); XI robotic assisted ventral hernia (N/A, 12/18/2021); and Insertion of mesh (12/18/2021). Family: family history includes Cervical cancer in his mother; Stroke in his mother.  Laboratory Chemistry Profile   Renal Lab Results  Component Value Date   BUN 22 11/20/2021   CREATININE 1.96 (H) 11/20/2021   BCR 11  11/20/2021    Hepatic Lab Results  Component Value Date   AST 18 11/20/2021   ALBUMIN 4.6 11/20/2021   ALKPHOS 82 11/20/2021    Electrolytes Lab Results  Component Value Date   NA 142 11/20/2021   K 3.7 11/20/2021   CL 104 11/20/2021   CALCIUM 10.1 11/20/2021   MG 2.0 11/20/2021    Bone Lab Results  Component Value Date   25OHVITD1 38 11/20/2021   25OHVITD2 1.0 11/20/2021   25OHVITD3 37 11/20/2021    Inflammation (CRP: Acute Phase) (ESR: Chronic Phase) No results found for: "CRP", "ESRSEDRATE", "LATICACIDVEN"        Note: Above Lab results reviewed.  Recent Imaging Review  DG Si Joints CLINICAL DATA:  Chronic pain for 5 years  EXAM: BILATERAL SACROILIAC JOINTS - 3+ VIEW  COMPARISON:  None Available.  FINDINGS: Frontal and bilateral oblique views of the sacroiliac joints are obtained. Joint spaces are symmetrical. No erosive changes. Prominent spondylosis and facet hypertrophy at the lumbosacral junction. Mild symmetrical bilateral hip osteoarthritis.  IMPRESSION: 1. Unremarkable bilateral sacroiliac joints. 2. Degenerative changes at the lumbosacral junction and bilateral hips.  Electronically Signed   By: Randa Ngo M.D.   On: 11/21/2021 21:24 DG Thoracic Spine 2 View CLINICAL DATA:  Chronic back pain for 5 years  EXAM: THORACIC SPINE 2 VIEWS  COMPARISON:  05/15/2020  FINDINGS: Frontal and lateral views of the thoracic spine are obtained. Alignment is anatomic. There is mild compression deformity of the superior endplate of the T51 vertebral body that has developed since prior study, otherwise age indeterminate. No other acute fractures. There is diffuse multilevel thoracic spondylosis greatest from T6 through T9. Paraspinal soft tissues are unremarkable.  IMPRESSION: 1. Age indeterminate compression deformity superior endplate T12 vertebral body, which has developed since prior CT 05/15/2020. 2. Multilevel thoracic spondylosis.  Electronically Signed   By: Randa Ngo M.D.   On: 11/21/2021 20:54 DG Lumbar Spine Complete W/Bend CLINICAL DATA:  Chronic low back pain for 5 years  EXAM: LUMBAR SPINE - COMPLETE WITH BENDING VIEWS  COMPARISON:  05/15/2020  FINDINGS: Frontal, bilateral oblique, lateral neutral, lateral flexion, and lateral extension views of the lumbar spine are obtained. There are 5 non-rib-bearing lumbar type vertebral bodies in normal anatomic alignment. Compression deformities involving the superior endplates of V61 and L3 have developed  since prior CT, and are otherwise age indeterminate. Less than 25% loss of height. No retropulsion. No additional fractures. There is multilevel spondylosis and facet hypertrophy, greatest at the lumbosacral junction, without significant change since prior exam. No instability with flexion or extension. Sacroiliac joints are normal.  IMPRESSION: 1. Interval development of superior endplate compression deformities at T12 and L3. These are age indeterminate, but are likely chronic given the lack of visualized fracture line on today's study. If further imaging is required to assess acuity of fractures, bone scan or MRI could be considered. 2. Stable lower lumbar spondylosis and facet hypertrophy. 3. No instability with flexion or extension.  Electronically Signed   By: Randa Ngo M.D.   On: 11/21/2021 20:51 Note: Reviewed        Physical Exam  General appearance: Well nourished, well developed, and well hydrated. In no apparent acute distress Mental status: Alert, oriented x 3 (person, place, & time)       Respiratory: No evidence of acute respiratory distress Eyes: PERLA Vitals: BP 131/73   Pulse 92   Temp 97.6 F (36.4 C)   Resp 18   Ht  $'5\' 11"'w$  (1.803 m)   Wt 215 lb (97.5 kg)   SpO2 96%   BMI 29.99 kg/m  BMI: Estimated body mass index is 29.99 kg/m as calculated from the following:   Height as of this encounter: $RemoveBeforeD'5\' 11"'FvcCwnMcCnqZWm$  (1.803 m).   Weight as of this encounter: 215 lb (97.5 kg). Ideal: Ideal body weight: 75.3 kg (166 lb 0.1 oz) Adjusted ideal body weight: 84.2 kg (185 lb 9.7 oz)  Lumbar Spine Area Exam  Skin & Axial Inspection: No masses, redness, or swelling Alignment: Levoscoliosis Functional ROM: Pain restricted ROM       Stability: No instability detected Muscle Tone/Strength: Functionally intact. No obvious neuro-muscular anomalies detected. Sensory (Neurological): Dermatomal pain pattern and neurogenic   Pain with lumbar extension and facet loading   Gait &  Posture Assessment  Ambulation: Limited Gait: Age-related, senile gait pattern Posture: Difficulty standing up straight, due to pain  Lower Extremity Exam      Side: Right lower extremity   Side: Left lower extremity  Stability: No instability observed           Stability: No instability observed          Skin & Extremity Inspection: Skin color, temperature, and hair growth are WNL. No peripheral edema or cyanosis. No masses, redness, swelling, asymmetry, or associated skin lesions. No contractures.   Skin & Extremity Inspection: Skin color, temperature, and hair growth are WNL. No peripheral edema or cyanosis. No masses, redness, swelling, asymmetry, or associated skin lesions. No contractures.  Functional ROM: Unrestricted ROM                   Functional ROM: Pain restricted ROM for hip and knee joints          Muscle Tone/Strength: Functionally intact. No obvious neuro-muscular anomalies detected.   Muscle Tone/Strength: Functionally intact. No obvious neuro-muscular anomalies detected.  Sensory (Neurological): Unimpaired         Sensory (Neurological): Dermatomal pain pattern        DTR: Patellar: deferred today Achilles: deferred today Plantar: deferred today   DTR: Patellar: deferred today Achilles: deferred today Plantar: deferred today  Palpation: No palpable anomalies   Palpation: No palpable anomalies     Assessment   Diagnosis Status  1. Lumbar facet arthropathy   2. Lumbar spondylosis   3. Chronic pain syndrome   4. Compression fracture of thoracic vertebra, unspecified thoracic vertebral level, sequela   5. Thoracic spondylosis   6. Lumbar degenerative disc disease    Persistent Persistent Controlled   Updated Problems: Problem  Compression Fracture of Thoracic Vertebra ([facility])  Thoracic Spondylosis  Chronic Bilateral Low Back Pain With Left-Sided Sciatica  Lumbar Degenerative Disc Disease  Lumbar Spondylosis  Chronic Radicular Lumbar Pain  Chronic Pain  Syndrome  Wedge Compression Fracture of T11-T12 Vertebra, Sequela  Multiple Myeloma Not Having Achieved Remission ([facility])  Thrombocytopenia ([facility])    Plan of Care    [name] has a current medication list which includes the following long-term medication(s): amlodipine, calcium carb-cholecalciferol, cetirizine, gabapentin, omeprazole, sildenafil, and simvastatin.   Peter Webster has a history of greater than 3 months of moderate to severe pain which is resulted in functional impairment.  The patient has tried various conservative therapeutic options such as NSAIDs, Tylenol, muscle relaxants, physical therapy which was inadequately effective.  Patient's pain is predominantly axial with physical exam and Lumbar xray findings suggestive of facet arthropathy. Lumbar facet medial branch nerve blocks were discussed with the  patient.  Risks and benefits were reviewed.  Patient would like to proceed with bilateral L3, L4, L5 medial branch nerve block.   Pharmacotherapy (Medications Ordered): Meds ordered this encounter  Medications   gabapentin (NEURONTIN) 600 MG tablet    Sig: Take 1 tablet (600 mg total) by mouth 2 (two) times daily.    Dispense:  60 tablet    Refill:  4   Orders:  Orders Placed This Encounter  Procedures   LUMBAR FACET(MEDIAL BRANCH NERVE BLOCK) MBNB    Standing Status:   Future    Standing Expiration Date:   06/12/2022    Scheduling Instructions:     Procedure: Lumbar facet block (AKA.: Lumbosacral medial branch nerve block)     Side: Bilateral     Level: L3-4 & L5-S1 Facets ( L3, L4, L5, Medial Branch Nerves)     Sedation: no sedation     Timeframe: ASAA    Order Specific Question:   Where will this procedure be performed?    Answer:   [facility]   Follow-up plan:   Return in about 4 weeks (around 04/10/2022) for B/L L3, 4, 5 MBNB , in clinic NS.    Recent Visits Date Type Provider Dept  12/17/21 Office Visit Gillis Santa, MD [facility]  Showing recent visits within past 90 days and meeting all other requirements Today's Visits Date Type Provider Dept  03/13/22 Office Visit Gillis Santa, MD [facility]  Showing today's visits and meeting all other requirements Future Appointments Date Type Provider Dept  04/30/22 Appointment Gillis Santa, MD [facility]  Showing future appointments within next 90 days and meeting all other requirements  I discussed the assessment and treatment plan with the patient. The patient was provided an opportunity to ask questions and all were answered. The patient agreed with the plan and demonstrated an understanding of the instructions.  Patient advised to call back or seek an in-person evaluation if the symptoms or condition worsens.  Duration of encounter: 69minutes.  Total time on encounter, as per AMA guidelines included both the face-to-face and non-face-to-face time personally spent by the physician and/or other qualified health care professional(s) on the day of the encounter (includes time in activities that require the physician or other qualified health care professional and does not include time in activities normally performed by clinical staff). Physician's time may include the following activities when performed: preparing to see the patient (eg, review of tests, pre-charting review of records) obtaining and/or reviewing separately obtained history performing a medically appropriate examination and/or evaluation counseling and educating the patient/family/caregiver ordering medications, tests, or procedures referring and communicating with other health care professionals (when not separately reported) documenting clinical information in the electronic or other health record independently interpreting results (not separately reported) and communicating results to the patient/ family/caregiver care coordination (not separately reported)  Note by: Gillis Santa, MD Date: 03/13/2022; Time: 3:20 PM

## 2022-03-13 NOTE — Patient Instructions (Signed)

## 2022-04-23 ENCOUNTER — Ambulatory Visit
Payer: No Typology Code available for payment source | Admitting: Student in an Organized Health Care Education/Training Program

## 2022-04-30 ENCOUNTER — Ambulatory Visit
Payer: No Typology Code available for payment source | Attending: Student in an Organized Health Care Education/Training Program | Admitting: Student in an Organized Health Care Education/Training Program

## 2022-04-30 ENCOUNTER — Encounter: Payer: Self-pay | Admitting: Student in an Organized Health Care Education/Training Program

## 2022-04-30 ENCOUNTER — Ambulatory Visit
Admission: RE | Admit: 2022-04-30 | Discharge: 2022-04-30 | Disposition: A | Discharge status: Home / Self Care | Payer: No Typology Code available for payment source | Source: Ambulatory Visit | Attending: Student in an Organized Health Care Education/Training Program | Admitting: Student in an Organized Health Care Education/Training Program

## 2022-04-30 VITALS — BP 139/89 | HR 81 | Temp 97.0°F | Resp 18 | Ht 71.0 in | Wt 211.0 lb

## 2022-04-30 DIAGNOSIS — M47816 Spondylosis without myelopathy or radiculopathy, lumbar region: Secondary | ICD-10-CM | POA: Diagnosis present

## 2022-04-30 DIAGNOSIS — G894 Chronic pain syndrome: Secondary | ICD-10-CM | POA: Diagnosis present

## 2022-04-30 MED ORDER — DEXAMETHASONE SODIUM PHOSPHATE 10 MG/ML IJ SOLN
10.0000 mg | Freq: Once | INTRAMUSCULAR | Status: AC
Start: 2022-04-30 — End: 2022-04-30
  Administered 2022-04-30: 10 mg
  Filled 2022-04-30: qty 1

## 2022-04-30 MED ORDER — ROPIVACAINE HCL 2 MG/ML IJ SOLN
9.0000 mL | Freq: Once | INTRAMUSCULAR | Status: AC
Start: 2022-04-30 — End: 2022-04-30
  Administered 2022-04-30: 9 mL via PERINEURAL
  Filled 2022-04-30: qty 20

## 2022-04-30 MED ORDER — DEXAMETHASONE SODIUM PHOSPHATE 10 MG/ML IJ SOLN
10.0000 mg | Freq: Once | INTRAMUSCULAR | Status: AC
  Administered 2022-04-30: 10 mg
  Filled 2022-04-30: qty 1

## 2022-04-30 MED ORDER — LIDOCAINE HCL 2 % IJ SOLN
20.0000 mL | Freq: Once | INTRAMUSCULAR | Status: AC
Start: 2022-04-30 — End: 2022-04-30
  Administered 2022-04-30: 400 mg
  Filled 2022-04-30: qty 40

## 2022-04-30 NOTE — Progress Notes (Signed)
PROVIDER NOTE: Interpretation of information contained herein should be left to medically-trained personnel. Specific patient instructions are provided elsewhere under "Patient Instructions" section of medical record. This document was created in part using STT-dictation technology, any transcriptional errors that may result from this process are unintentional.  Patient: Peter Webster Type: Established DOB: 1941-07-01 MRN: [id number] PCP: [facility]  Service: Procedure DOS: 04/30/2022 Setting: Ambulatory Location: Ambulatory outpatient facility Delivery: Face-to-face Provider: Gillis Santa, MD Specialty: Interventional Pain Management Specialty designation: 09 Location: Outpatient facility Ref. Prov.: [facility]    Procedure:           Type: Lumbar Facet, Medial Branch Block(s) #1  Laterality: Bilateral  Level: L3, L4, L5, Medial Branch Level(s). Injecting these levels blocks the L3-4 and L4-5 lumbar facet joints. Imaging: Fluoroscopic guidance         Anesthesia: Local anesthesia (1-2% Lidocaine) DOS: 04/30/2022 Performed by: Gillis Santa, MD  Primary Purpose: Diagnostic/Therapeutic Indications: Low back pain severe enough to impact quality of life or function. 1. Lumbar facet arthropathy   2. Lumbar spondylosis   3. Chronic pain syndrome    NAS-11 Pain score:   Pre-procedure: 6 /10   Post-procedure: 6 /10     Position / Prep / Materials:  Position: Prone  Prep solution: DuraPrep (Iodine Povacrylex [0.7% available iodine] and Isopropyl Alcohol, 74% w/w) Area Prepped: Posterolateral Lumbosacral Spine (Wide prep: From the lower border of the scapula down to the end of the tailbone and from flank to flank.)  Materials:  Tray: Block Needle(s):  Type: Spinal  Gauge (G): 22  Length: 3.5-in Qty: 2     Pre-op H&P Assessment:  Peter Webster is a 81 y.o. (year old), male patient, seen today for interventional treatment. He  has a past surgical history that includes  Colonoscopy (2019); bilateral hernia (2018); XI robotic assisted ventral hernia (N/A, 12/18/2021); and Insertion of mesh (12/18/2021). Peter Webster has a current medication list which includes the following prescription(s): acetaminophen, acyclovir, amlodipine, calcium carb-cholecalciferol, cetirizine, clobetasol ointment, dss, abdominal binder/elastic xl, gabapentin, hydrocortisone, hydrocortisone, ibuprofen, lidocaine, omeprazole, sildenafil, simvastatin, tizanidine, triamcinolone ointment, and urea. His primarily concern today is the Back Pain (Bilateral lumbar )  Initial Vital Signs:  Pulse/HCG Rate: 81ECG Heart Rate: 76 Temp: (!) 97 F (36.1 C) Resp: 20 BP: 124/68 SpO2: 100 %  BMI: Estimated body mass index is 29.43 kg/m as calculated from the following:   Height as of this encounter: '5\' 11"'$  (1.803 m).   Weight as of this encounter: 211 lb (95.7 kg).  Risk Assessment: Allergies: Reviewed. He is allergic to lisinopril.  Allergy Precautions: None required Coagulopathies: Reviewed. None identified.  Blood-thinner therapy: None at this time Active Infection(s): Reviewed. None identified. Peter Webster is afebrile  Site Confirmation: Peter Webster was asked to confirm the procedure and laterality before marking the site Procedure checklist: Completed Consent: Before the procedure and under the influence of no sedative(s), amnesic(s), or anxiolytics, the patient was informed of the treatment options, risks and possible complications. To fulfill our ethical and legal obligations, as recommended by the American Medical Association's Code of Ethics, I have informed the patient of my clinical impression; the nature and purpose of the treatment or procedure; the risks, benefits, and possible complications of the intervention; the alternatives, including doing nothing; the risk(s) and benefit(s) of the alternative treatment(s) or procedure(s); and the risk(s) and benefit(s) of doing nothing. The patient was  provided information about the general risks and possible complications associated with the procedure. These  may include, but are not limited to: failure to achieve desired goals, infection, bleeding, organ or nerve damage, allergic reactions, paralysis, and death. In addition, the patient was informed of those risks and complications associated to Spine-related procedures, such as failure to decrease pain; infection (i.e.: Meningitis, epidural or intraspinal abscess); bleeding (i.e.: epidural hematoma, subarachnoid hemorrhage, or any other type of intraspinal or peri-dural bleeding); organ or nerve damage (i.e.: Any type of peripheral nerve, nerve root, or spinal cord injury) with subsequent damage to sensory, motor, and/or autonomic systems, resulting in permanent pain, numbness, and/or weakness of one or several areas of the body; allergic reactions; (i.e.: anaphylactic reaction); and/or death. Furthermore, the patient was informed of those risks and complications associated with the medications. These include, but are not limited to: allergic reactions (i.e.: anaphylactic or anaphylactoid reaction(s)); adrenal axis suppression; blood sugar elevation that in diabetics may result in ketoacidosis or comma; water retention that in patients with history of congestive heart failure may result in shortness of breath, pulmonary edema, and decompensation with resultant heart failure; weight gain; swelling or edema; medication-induced neural toxicity; particulate matter embolism and blood vessel occlusion with resultant organ, and/or nervous system infarction; and/or aseptic necrosis of one or more joints. Finally, the patient was informed that Medicine is not an exact science; therefore, there is also the possibility of unforeseen or unpredictable risks and/or possible complications that may result in a catastrophic outcome. The patient indicated having understood very clearly. We have given the patient no guarantees  and we have made no promises. Enough time was given to the patient to ask questions, all of which were answered to the patient's satisfaction. Peter Webster has indicated that he wanted to continue with the procedure. Attestation: I, the ordering provider, attest that I have discussed with the patient the benefits, risks, side-effects, alternatives, likelihood of achieving goals, and potential problems during recovery for the procedure that I have provided informed consent. Date  Time: 04/30/2022  9:53 AM  Pre-Procedure Preparation:  Monitoring: As per clinic protocol. Respiration, ETCO2, SpO2, BP, heart rate and rhythm monitor placed and checked for adequate function Safety Precautions: Patient was assessed for positional comfort and pressure points before starting the procedure. Time-out: I initiated and conducted the "Time-out" before starting the procedure, as per protocol. The patient was asked to participate by confirming the accuracy of the "Time Out" information. Verification of the correct person, site, and procedure were performed and confirmed by me, the nursing staff, and the patient. "Time-out" conducted as per Joint Commission's Universal Protocol (UP.01.01.01). Time: 1040  Description of Procedure:          Laterality: Bilateral. The procedure was performed in identical fashion on both sides. Targeted Levels: L3, L4, L5, Medial Branch Level(s)  Safety Precautions: Aspiration looking for blood return was conducted prior to all injections. At no point did we inject any substances, as a needle was being advanced. Before injecting, the patient was told to immediately notify me if he was experiencing any new onset of "ringing in the ears, or metallic taste in the mouth". No attempts were made at seeking any paresthesias. Safe injection practices and needle disposal techniques used. Medications properly checked for expiration dates. SDV (single dose vial) medications used. After the completion  of the procedure, all disposable equipment used was discarded in the proper designated medical waste containers. Local Anesthesia: Protocol guidelines were followed. The patient was positioned over the fluoroscopy table. The area was prepped in the usual manner. The time-out was  completed. The target area was identified using fluoroscopy. A 12-in long, straight, sterile hemostat was used with fluoroscopic guidance to locate the targets for each level blocked. Once located, the skin was marked with an approved surgical skin marker. Once all sites were marked, the skin (epidermis, dermis, and hypodermis), as well as deeper tissues (fat, connective tissue and muscle) were infiltrated with a small amount of a short-acting local anesthetic, loaded on a 10cc syringe with a 25G, 1.5-in  Needle. An appropriate amount of time was allowed for local anesthetics to take effect before proceeding to the next step. Local Anesthetic: Lidocaine 2.0% The unused portion of the local anesthetic was discarded in the proper designated containers. Technical description of process:  L3 Medial Branch Nerve Block (MBB): The target area for the L3 medial branch is at the junction of the postero-lateral aspect of the superior articular process and the superior, posterior, and medial edge of the transverse process of L4. Under fluoroscopic guidance, a Quincke needle was inserted until contact was made with os over the superior postero-lateral aspect of the pedicular shadow (target area). After negative aspiration for blood, 285m of the nerve block solution was injected without difficulty or complication. The needle was removed intact. L4 Medial Branch Nerve Block (MBB): The target area for the L4 medial branch is at the junction of the postero-lateral aspect of the superior articular process and the superior, posterior, and medial edge of the transverse process of L5. Under fluoroscopic guidance, a Quincke needle was inserted until contact  was made with os over the superior postero-lateral aspect of the pedicular shadow (target area). After negative aspiration for blood, 267mof the nerve block solution was injected without difficulty or complication. The needle was removed intact. L5 Medial Branch Nerve Block (MBB): The target area for the L5 medial branch is at the junction of the postero-lateral aspect of the superior articular process and the superior, posterior, and medial edge of the sacral ala. Under fluoroscopic guidance, a Quincke needle was inserted until contact was made with os over the superior postero-lateral aspect of the pedicular shadow (target area). After negative aspiration for blood, 85m78mf the nerve block solution was injected without difficulty or complication. The needle was removed intact.   12 cc solution made of 10 cc of 0.2% ropivacaine, 2 cc of Decadron 10 mg/cc.  2 cc injected at each level above bilaterally.   Once the entire procedure was completed, the treated area was cleaned, making sure to leave some of the prepping solution back to take advantage of its long term bactericidal properties.         Illustration of the posterior view of the lumbar spine and the posterior neural structures. Laminae of L2 through S1 are labeled. DPRL5, dorsal primary ramus of L5; DPRS1, dorsal primary ramus of S1; DPR3, dorsal primary ramus of L3; FJ, facet (zygapophyseal) joint L3-L4; I, inferior articular process of L4; LB1, lateral branch of dorsal primary ramus of L1; IAB, inferior articular branches from L3 medial branch (supplies L4-L5 facet joint); IBP, intermediate branch plexus; MB3, medial branch of dorsal primary ramus of L3; NR3, third lumbar nerve root; S, superior articular process of L5; SAB, superior articular branches from L4 (supplies L4-5 facet joint also); TP3, transverse process of L3.  Vitals:   04/30/22 0958 04/30/22 1040 04/30/22 1045  BP: 124/68 (!) 136/93 139/89  Pulse: 81    Resp:  20 18   Temp: (!) 97 F (36.1 C)    TempSrc:  Temporal    SpO2: 100% 100% 100%  Weight: 211 lb (95.7 kg)    Height: '5\' 11"'$  (1.803 m)       Start Time: 1040 hrs. End Time: 1048 hrs.  Imaging Guidance (Spinal):          Type of Imaging Technique: Fluoroscopy Guidance (Spinal) Indication(s): Assistance in needle guidance and placement for procedures requiring needle placement in or near specific anatomical locations not easily accessible without such assistance. Exposure Time: Please see nurses notes. Contrast: None used. Fluoroscopic Guidance: I was personally present during the use of fluoroscopy. "Tunnel Vision Technique" used to obtain the best possible view of the target area. Parallax error corrected before commencing the procedure. "Direction-depth-direction" technique used to introduce the needle under continuous pulsed fluoroscopy. Once target was reached, antero-posterior, oblique, and lateral fluoroscopic projection used confirm needle placement in all planes. Images permanently stored in EMR. Interpretation: No contrast injected. I personally interpreted the imaging intraoperatively. Adequate needle placement confirmed in multiple planes. Permanent images saved into the patient's record.  Antibiotic Prophylaxis:   Anti-infectives (From admission, onward)    None      Indication(s): None identified  Post-operative Assessment:  Post-procedure Vital Signs:  Pulse/HCG Rate: 8190 Temp: (!) 97 F (36.1 C) Resp: 18 BP: 139/89 SpO2: 100 %  EBL: None  Complications: No immediate post-treatment complications observed by team, or reported by patient.  Note: The patient tolerated the entire procedure well. A repeat set of vitals were taken after the procedure and the patient was kept under observation following institutional policy, for this type of procedure. Post-procedural neurological assessment was performed, showing return to baseline, prior to discharge. The patient was  provided with post-procedure discharge instructions, including a section on how to identify potential problems. Should any problems arise concerning this procedure, the patient was given instructions to immediately contact us, at any time, without hesitation. In any case, we plan to contact the patient by telephone for a follow-up status report regarding this interventional procedure.  Comments:  No additional relevant information.  Plan of Care  Orders:  Orders Placed This Encounter  Procedures   DG PAIN CLIN[facility] C-ARM 1-60 MIN NO REPORT    Intraoperative interpretation by procedural physician at [place].    Standing Status:   Standing    Number of Occurrences:   1    Order Specific Question:   Reason for exam:    Answer:   Assistance in needle guidance and placement for procedures requiring needle placement in or near specific anatomical locations not easily accessible without such assistance.     Medications ordered for procedure: Meds ordered this encounter  Medications   lidocaine (XYLOCAINE) 2 % (with pres) injection 400 mg   dexamethasone (DECADRON) injection 10 mg   dexamethasone (DECADRON) injection 10 mg   ropivacaine (PF) 2 mg/mL (0.2%) (NAROPIN) injection 9 mL   ropivacaine (PF) 2 mg/mL (0.2%) (NAROPIN) injection 9 mL   Medications administered: We administered lidocaine, dexamethasone, dexamethasone, ropivacaine (PF) 2 mg/mL (0.2%), and ropivacaine (PF) 2 mg/mL (0.2%).  See the medical record for exact dosing, route, and time of administration.  Follow-up plan:   Return in about 4 weeks (around 05/28/2022), or VV PPE follow up.      Recent Visits Date Type Provider Dept  03/13/22 Office Visit Gillis Santa, MD [facility]  Showing recent visits within past 90 days and meeting all other requirements Today's Visits Date Type Provider Dept  04/30/22 Procedure visit Gillis Santa,  MD [facility]  Showing today's visits and meeting all  other requirements Future Appointments Date Type Provider Dept  05/28/22 Appointment Gillis Santa, MD [facility]  Showing future appointments within next 90 days and meeting all other requirements  Disposition: Discharge home  Discharge (Date  Time): 04/30/2022; 1100 hrs.   Primary Care Physician: [facility] Location: [facility] Note by: Gillis Santa, MD Date: 04/30/2022; Time: 11:06 AM  Disclaimer:  Medicine is not an exact science. The only guarantee in medicine is that nothing is guaranteed. It is important to note that the decision to proceed with this intervention was based on the information collected from the patient. The Data and conclusions were drawn from the patient's questionnaire, the interview, and the physical examination. Because the information was provided in large part by the patient, it cannot be guaranteed that it has not been purposely or unconsciously manipulated. Every effort has been made to obtain as much relevant data as possible for this evaluation. It is important to note that the conclusions that lead to this procedure are derived in large part from the available data. Always take into account that the treatment will also be dependent on availability of resources and existing treatment guidelines, considered by other Pain Management Practitioners as being common knowledge and practice, at the time of the intervention. For Medico-Legal purposes, it is also important to point out that variation in procedural techniques and pharmacological choices are the acceptable norm. The indications, contraindications, technique, and results of the above procedure should only be interpreted and judged by a Board-Certified Interventional Pain Specialist with extensive familiarity and expertise in the same exact procedure and technique.

## 2022-04-30 NOTE — Patient Instructions (Signed)
Pain Management Discharge Instructions  General Discharge Instructions :  If you need to reach your doctor call: Monday-Friday 8:00 am - 4:00 pm at [phone] or toll free [phone].  After clinic hours [phone] to have operator reach doctor.  Bring all of your medication bottles to all your appointments in the pain clinic.  To cancel or reschedule your appointment with Pain Management please remember to call 24 hours in advance to avoid a fee.  Refer to the educational materials which you have been given on: General Risks, I had my Procedure. Discharge Instructions, Post Sedation.  Post Procedure Instructions:  The drugs you were given will stay in your system until tomorrow, so for the next 24 hours you should not drive, make any legal decisions or drink any alcoholic beverages.  You may eat anything you prefer, but it is better to start with liquids then soups and crackers, and gradually work up to solid foods.  Please notify your doctor immediately if you have any unusual bleeding, trouble breathing or pain that is not related to your normal pain.  Depending on the type of procedure that was done, some parts of your body may feel week and/or numb.  This usually clears up by tonight or the next day.  Walk with the use of an assistive device or accompanied by an adult for the 24 hours.  You may use ice on the affected area for the first 24 hours.  Put ice in a Ziploc bag and cover with a towel and place against area 15 minutes on 15 minutes off.  You may switch to heat after 24 hours.Facet Blocks Patient Information  Description: The facets are joints in the spine between the vertebrae.  Like any joints in the body, facets can become irritated and painful.  Arthritis can also effect the facets.  By injecting steroids and local anesthetic in and around these joints, we can temporarily block the nerve supply to them.  Steroids act directly on irritated nerves and tissues to  reduce selling and inflammation which often leads to decreased pain.  Facet blocks may be done anywhere along the spine from the neck to the low back depending upon the location of your pain.   After numbing the skin with local anesthetic (like Novocaine), a small needle is passed onto the facet joints under x-ray guidance.  You may experience a sensation of pressure while this is being done.  The entire block usually lasts about 15-25 minutes.   Conditions which may be treated by facet blocks:  Low back/buttock pain Neck/shoulder pain Certain types of headaches  Preparation for the injection:  Do not eat any solid food or dairy products within 8 hours of your appointment. You may drink clear liquid up to 3 hours before appointment.  Clear liquids include water, black coffee, juice or soda.  No milk or cream please. You may take your regular medication, including pain medications, with a sip of water before your appointment.  Diabetics should hold regular insulin (if taken separately) and take 1/2 normal NPH dose the morning of the procedure.  Carry some sugar containing items with you to your appointment. A driver must accompany you and be prepared to drive you home after your procedure. Bring all your current medications with you. An IV may be inserted and sedation may be given at the discretion of the physician. A blood pressure cuff, EKG and other monitors will often be applied during the procedure.  Some patients may need to   have extra oxygen administered for a short period. You will be asked to provide medical information, including your allergies and medications, prior to the procedure.  We must know immediately if you are taking blood thinners (like Coumadin/Warfarin) or if you are allergic to IV iodine contrast (dye).  We must know if you could possible be pregnant.  Possible side-effects:  Bleeding from needle site Infection (rare, may require surgery) Nerve injury (rare) Numbness  & tingling (temporary) Difficulty urinating (rare, temporary) Spinal headache (a headache worse with upright posture) Light-headedness (temporary) Pain at injection site (serveral days) Decreased blood pressure (rare, temporary) Weakness in arm/leg (temporary) Pressure sensation in back/neck (temporary)   Call if you experience:  Fever/chills associated with headache or increased back/neck pain Headache worsened by an upright position New onset, weakness or numbness of an extremity below the injection site Hives or difficulty breathing (go to the emergency room) Inflammation or drainage at the injection site(s) Severe back/neck pain greater than usual New symptoms which are concerning to you  Please note:  Although the local anesthetic injected can often make your back or neck feel good for several hours after the injection, the pain will likely return. It takes 3-7 days for steroids to work.  You may not notice any pain relief for at least one week.  If effective, we will often do a series of 2-3 injections spaced 3-6 weeks apart to maximally decrease your pain.  After the initial series, you may be a candidate for a more permanent nerve block of the facets.  If you have any questions, please call [phone] [facility] 

## 2022-04-30 NOTE — Progress Notes (Signed)
Safety precautions to be maintained throughout the outpatient stay will include: orient to surroundings, keep bed in low position, maintain call bell within reach at all times, provide assistance with transfer out of bed and ambulation.  

## 2022-05-01 ENCOUNTER — Telehealth: Payer: Self-pay

## 2022-05-01 NOTE — Telephone Encounter (Signed)
Calling PP denies any needs at this time. Instructed to call if needed.

## 2022-05-21 IMAGING — CR DG SI JOINTS 3+V
1 series · 3 of 3 positions shown · non-contrast
Comparison: None Available.

CLINICAL DATA: Chronic pain for 5 years

EXAM:
BILATERAL SACROILIAC JOINTS - 3+ VIEW

[Series 1: dg si joints · 0.14mm/px · 3 of 3 slices shown]
[im 1/3]
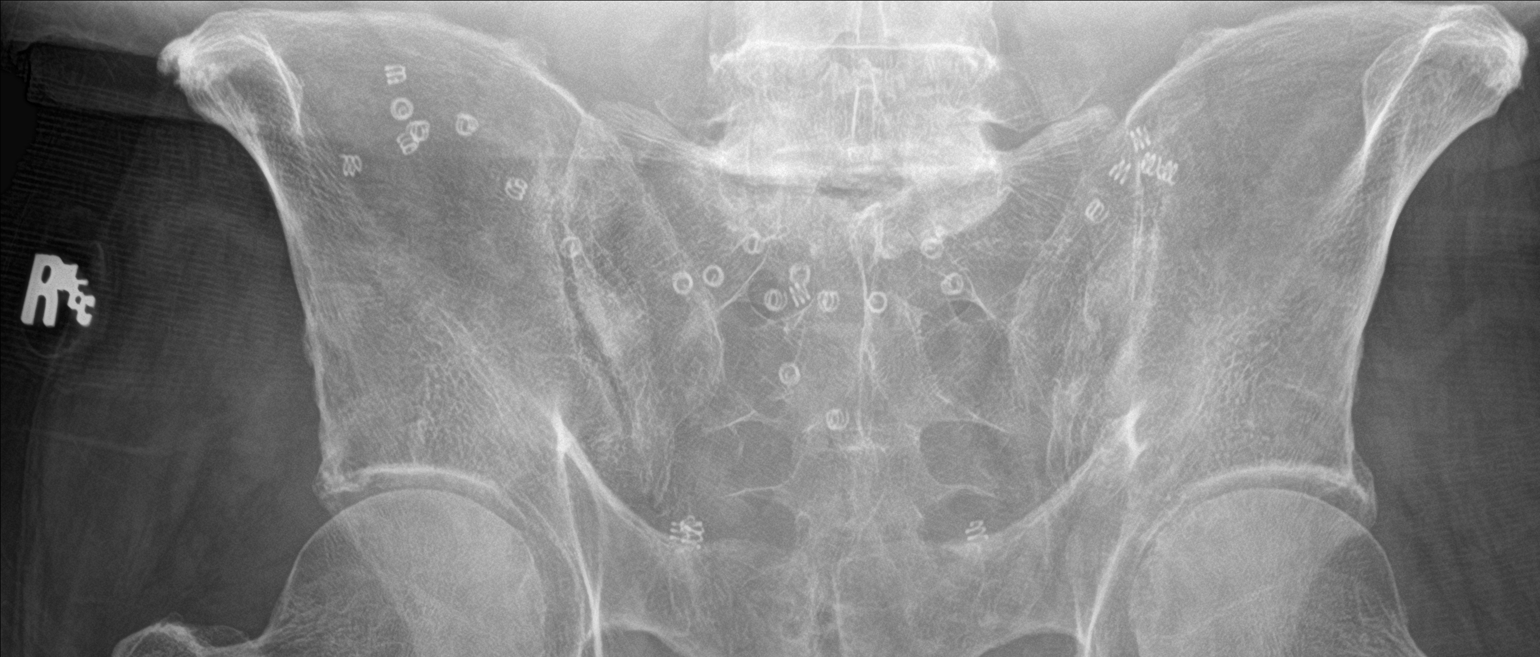
[im 2/3]
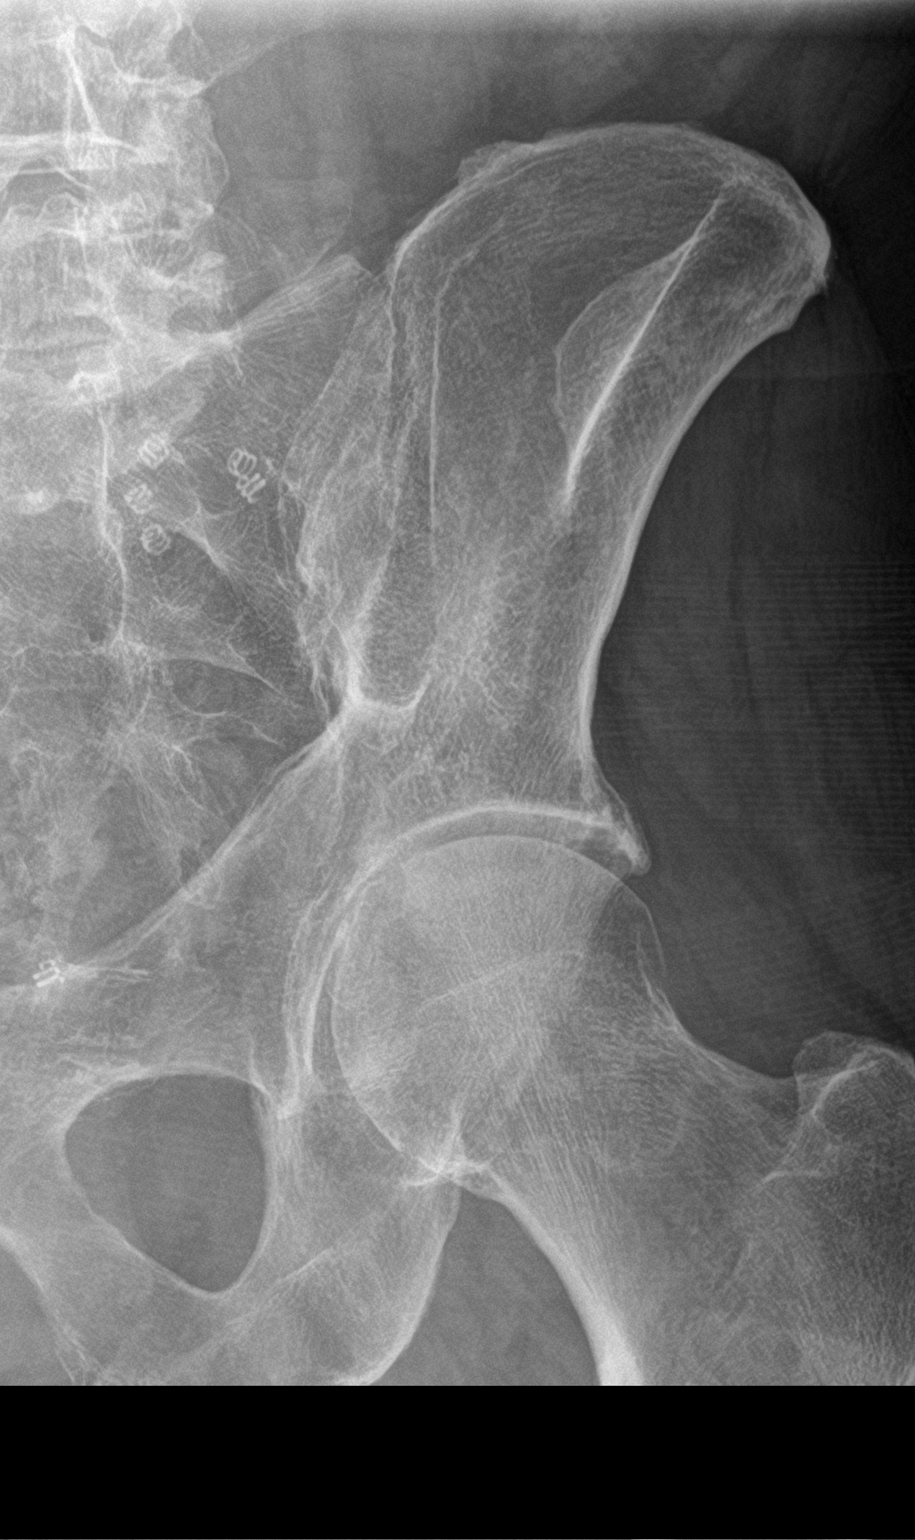
[im 3/3]
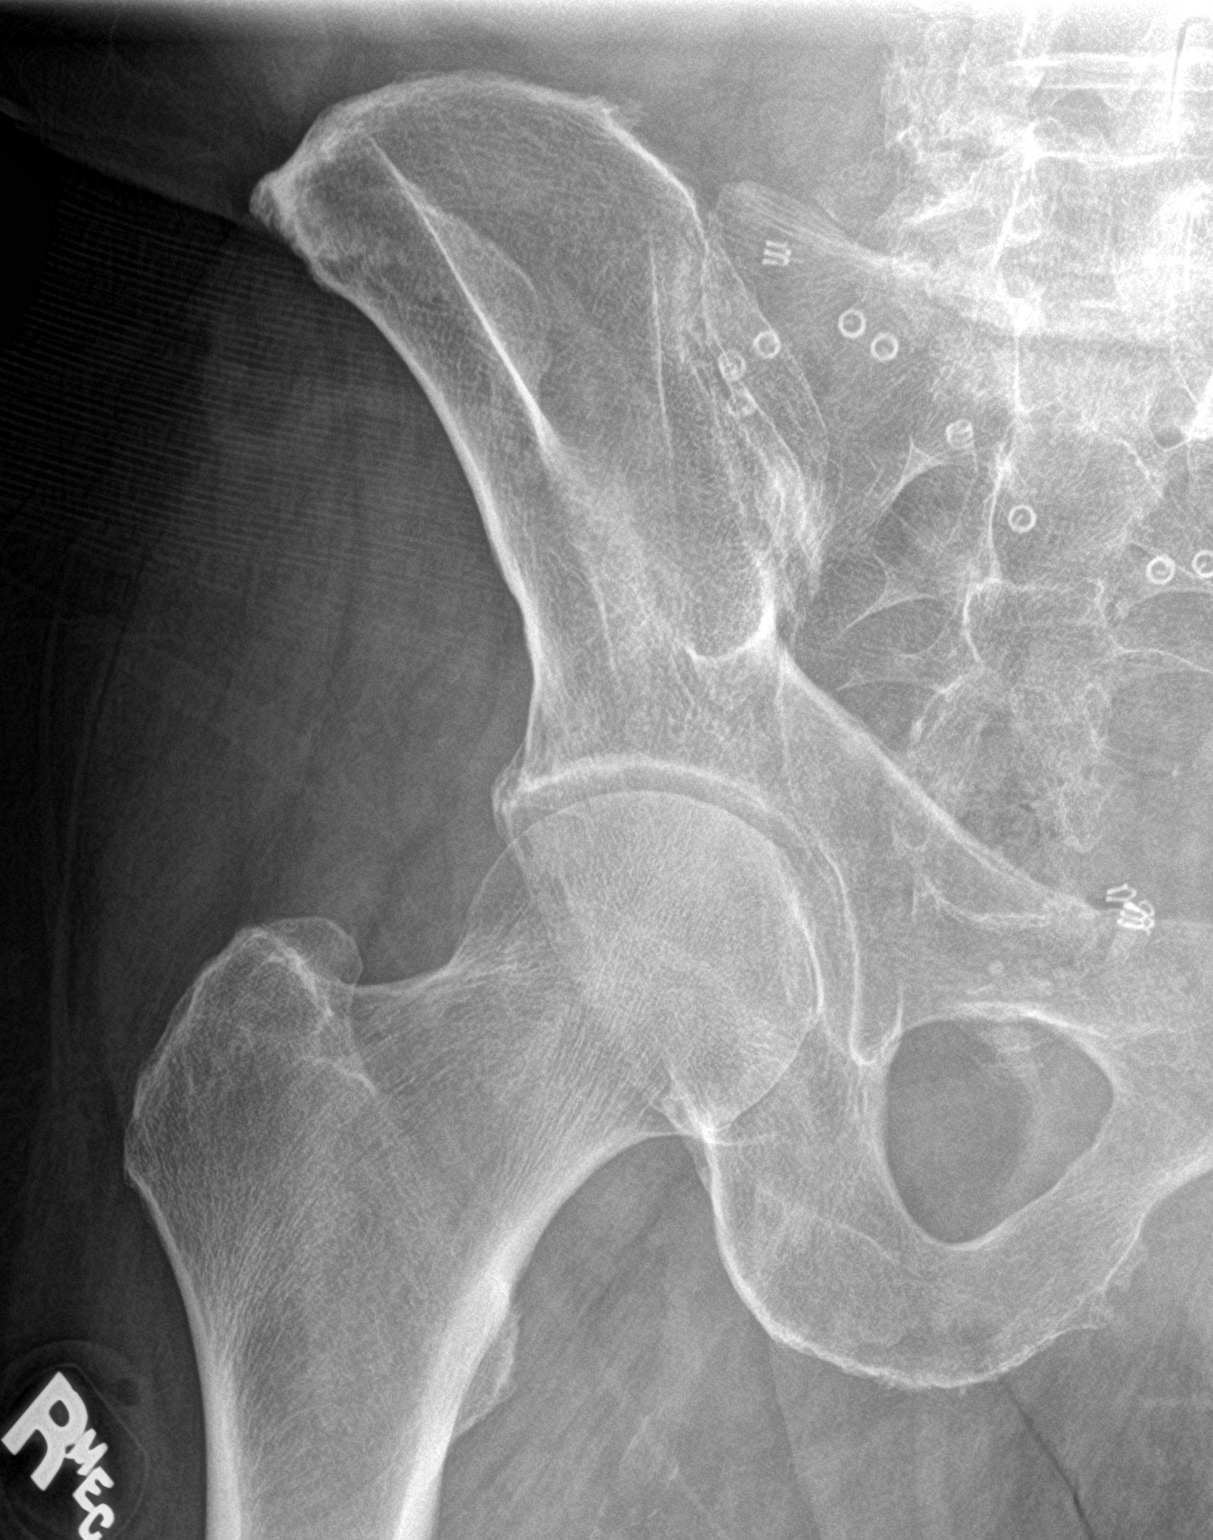

[3 of 3 positions shown; findings below may reference images not displayed]

FINDINGS: Frontal and bilateral oblique views of the sacroiliac joints are
obtained. Joint spaces are symmetrical. No erosive changes.
Prominent spondylosis and facet hypertrophy at the lumbosacral
junction. Mild symmetrical bilateral hip osteoarthritis.
IMPRESSION: 1. Unremarkable bilateral sacroiliac joints.
2. Degenerative changes at the lumbosacral junction and bilateral
hips.

## 2022-05-28 ENCOUNTER — Ambulatory Visit
Payer: No Typology Code available for payment source | Attending: Student in an Organized Health Care Education/Training Program | Admitting: Student in an Organized Health Care Education/Training Program

## 2022-05-28 DIAGNOSIS — M47816 Spondylosis without myelopathy or radiculopathy, lumbar region: Secondary | ICD-10-CM

## 2022-05-28 NOTE — Progress Notes (Signed)
I attempted to call the patient however no response. Voicemail left instructing patient to call front desk office at [phone] to reschedule appointment. -Dr Seab Axel  

## 2022-07-10 ENCOUNTER — Ambulatory Visit
Payer: No Typology Code available for payment source | Attending: Student in an Organized Health Care Education/Training Program | Admitting: Student in an Organized Health Care Education/Training Program

## 2022-07-10 ENCOUNTER — Encounter: Payer: Self-pay | Admitting: Student in an Organized Health Care Education/Training Program

## 2022-07-10 DIAGNOSIS — M47816 Spondylosis without myelopathy or radiculopathy, lumbar region: Secondary | ICD-10-CM

## 2022-07-10 DIAGNOSIS — G8929 Other chronic pain: Secondary | ICD-10-CM

## 2022-07-10 DIAGNOSIS — M25562 Pain in left knee: Secondary | ICD-10-CM

## 2022-07-10 DIAGNOSIS — M25561 Pain in right knee: Secondary | ICD-10-CM | POA: Diagnosis not present

## 2022-07-10 DIAGNOSIS — G894 Chronic pain syndrome: Secondary | ICD-10-CM | POA: Diagnosis not present

## 2022-07-10 MED ORDER — DICLOFENAC SODIUM 1 % EX GEL: 4.0000 g | Freq: Four times a day (QID) | CUTANEOUS | 2 refills | Status: AC

## 2022-07-10 NOTE — Progress Notes (Signed)
Patient: Peter Webster  Service Category: E/M  Provider: Gillis Santa, MD  DOB: [birth year]  DOS: 07/10/2022  Location: Office  MRN: [id number]  Setting: Ambulatory outpatient  Referring Provider: [facility]  Type: Established Patient  Specialty: Interventional Pain Management  PCP: [facility]  Location: Remote location  Delivery: TeleHealth     Virtual Encounter - Pain Management PROVIDER NOTE: Information contained herein reflects review and annotations entered in association with encounter. Interpretation of such information and data should be left to medically-trained personnel. Information provided to patient can be located elsewhere in the medical record under "Patient Instructions". Document created using STT-dictation technology, any transcriptional errors that may result from process are unintentional.    Contact & Pharmacy Preferred: [phone] Home: [phone] (home) Mobile: [phone] (mobile) E-mail: [email or web address], [place] [address] [place] [address] Phone: [phone] Fax: [phone]   Pre-screening  Mr. Gluth offered "in-person" vs "virtual" encounter. He indicated preferring virtual for this encounter.   Reason COVID-19*  Social distancing based on CDC and AMA recommendations.   I contacted Phylliss Blakes on 07/10/2022 via telephone.      I clearly identified myself as Gillis Santa, MD. I verified that I was speaking with the correct person using two identifiers (Name: Cheng Dec, and date of birth: [birth year]).  Consent I sought verbal advanced consent from Phylliss Blakes for virtual visit interactions. I informed Mr. Scoggins of possible security and privacy concerns, risks, and limitations associated with providing "not-in-person" medical evaluation and management services. I also informed Mr. Depaz of the availability of "in-person" appointments. Finally, I informed him that there would be a  charge for the virtual visit and that he could be  personally, fully or partially, financially responsible for it. Mr. Charnley expressed understanding and agreed to proceed.   Historic Elements   Mr. Keziah Drotar is a 82 y.o. year old, male patient evaluated today after our last contact on 05/28/2022. Mr. Glace  has a past medical history of Acute kidney failure ([place]), Allergy, Arthritis, Bladder cancer ([place]), Chronic pain syndrome, DDD (degenerative disc disease), lumbar, ED (erectile dysfunction), Elevated PSA, GERD (gastroesophageal reflux disease), Headache, Hernia of anterior abdominal wall, Hyperlipidemia, Hypertension, Multiple myeloma not having achieved remission [facility]), Right inguinal hernia, Sleep apnea, Thrombocytopenia ([place]), and Wedge compression fracture of t11-T12 vertebra, sequela. He also  has a past surgical history that includes Colonoscopy (2019); bilateral hernia (2018); XI robotic assisted ventral hernia (N/A, 12/18/2021); and Insertion of mesh (12/18/2021). Mr. Sebek has a current medication list which includes the following prescription(s): acetaminophen, acyclovir, amlodipine, calcium carb-cholecalciferol, cetirizine, clobetasol ointment, diclofenac sodium, dss, abdominal binder/elastic xl, gabapentin, hydrocortisone, hydrocortisone, ibuprofen, lidocaine, omeprazole, sildenafil, simvastatin, tizanidine, triamcinolone ointment, and urea. He  reports that he has never smoked. He has never used smokeless tobacco. He reports current alcohol use of about 1.0 standard drink of alcohol per week. He reports that he does not use drugs. Mr. Tiberio is allergic to lisinopril.  Estimated body mass index is 29.43 kg/m as calculated from the following:   Height as of 04/30/22: 5' 11" (1.803 m).   Weight as of 04/30/22: 211 lb (95.7 kg).  HPI  Today, he is being contacted for a post-procedure assessment.   Post-procedure evaluation   Type: Lumbar Facet, Medial Branch Block(s) #1  Laterality:  Bilateral  Level: L3, L4, L5, Medial Branch Level(s). Injecting these levels blocks the L3-4 and L4-5 lumbar facet joints. Imaging: Fluoroscopic guidance  Anesthesia: Local anesthesia (1-2% Lidocaine) DOS: 04/30/2022 Performed by: Gillis Santa, MD  Primary Purpose: Diagnostic/Therapeutic Indications: Low back pain severe enough to impact quality of life or function. 1. Lumbar facet arthropathy   2. Lumbar spondylosis   3. Chronic pain syndrome    NAS-11 Pain score:   Pre-procedure: 6 /10   Post-procedure: 6 /10      Effectiveness:  Initial hour after procedure: 100 %  Subsequent 4-6 hours post-procedure: 100 %  Analgesia past initial 6 hours: 100 % (good pain relief x 2 - 3 days,  the back remained improved.  currently he is in the process of moving and is aggravating the back.  this is d/t strenuous work)  Ongoing improvement:  Analgesic:  50-60% for 8 weeks however pain is now returning as the patient is in the process of moving and has been exerting himself more Function: Back to baseline ROM: Back to baseline   Laboratory Chemistry Profile   Renal Lab Results  Component Value Date   BUN 22 11/20/2021   CREATININE 1.96 (H) 11/20/2021   BCR 11 11/20/2021    Hepatic Lab Results  Component Value Date   AST 18 11/20/2021   ALBUMIN 4.6 11/20/2021   ALKPHOS 82 11/20/2021    Electrolytes Lab Results  Component Value Date   NA 142 11/20/2021   K 3.7 11/20/2021   CL 104 11/20/2021   CALCIUM 10.1 11/20/2021   MG 2.0 11/20/2021    Bone Lab Results  Component Value Date   25OHVITD1 38 11/20/2021   25OHVITD2 1.0 11/20/2021   25OHVITD3 37 11/20/2021    Inflammation (CRP: Acute Phase) (ESR: Chronic Phase) No results found for: "CRP", "ESRSEDRATE", "LATICACIDVEN"       Note: Above Lab results reviewed.   Assessment  The primary encounter diagnosis was Lumbar facet arthropathy. Diagnoses of Lumbar spondylosis, Chronic pain syndrome, and Bilateral chronic  knee pain were also pertinent to this visit.  Plan of Care    Mr. Damontay Alred has a current medication list which includes the following long-term medication(s): amlodipine, calcium carb-cholecalciferol, cetirizine, gabapentin, omeprazole, sildenafil, and simvastatin.  Pharmacotherapy (Medications Ordered): Meds ordered this encounter  Medications   diclofenac Sodium (VOLTAREN) 1 % GEL    Sig: Apply 4 g topically 4 (four) times daily.    Dispense:  300 g    Refill:  2    Do not add this medication to the electronic "Automatic Refill" notification system. Patient may have prescription filled one day early if pharmacy is closed on scheduled refill date.   Orders:  Orders Placed This Encounter  Procedures   LUMBAR FACET(MEDIAL BRANCH NERVE BLOCK) MBNB    Standing Status:   Future    Standing Expiration Date:   10/09/2022    Scheduling Instructions:     Procedure: Lumbar facet block (AKA.: Lumbosacral medial branch nerve block)     Side: Bilateral     Level: L3-4 & L5-S1 Facets (L3, L4, L5, Medial Branch Nerves)     Sedation: Patient's choice.     Timeframe: ASAA    Order Specific Question:   Where will this procedure be performed?    Answer:   [facility]   Follow-up plan:   Return in about 2 weeks (around 07/24/2022) for B/L L3, 4, 5 MBNB #2, in clinic NS.    Recent Visits Date Type Provider Dept  05/28/22 Office Visit Gillis Santa, MD [facility]  04/30/22 Procedure visit Gillis Santa, MD [facility]  Showing recent visits within past 90 days and meeting all other requirements Today's Visits Date Type Provider Dept  07/10/22 Office Visit Gillis Santa, MD [facility]  Showing today's visits and meeting all other requirements Future Appointments No visits were found meeting these conditions. Showing future appointments within next 90 days and meeting all other requirements  I discussed the assessment and treatment plan with the  patient. The patient was provided an opportunity to ask questions and all were answered. The patient agreed with the plan and demonstrated an understanding of the instructions.  Patient advised to call back or seek an in-person evaluation if the symptoms or condition worsens.  Duration of encounter: 38mnutes.  Note by: BGillis Santa MD Date: 07/10/2022; Time: 2:36 PM

## 2022-10-23 ENCOUNTER — Ambulatory Visit
Payer: No Typology Code available for payment source | Attending: Student in an Organized Health Care Education/Training Program | Admitting: Student in an Organized Health Care Education/Training Program

## 2022-10-23 ENCOUNTER — Encounter: Payer: Self-pay | Admitting: Student in an Organized Health Care Education/Training Program

## 2022-10-23 VITALS — BP 127/81 | HR 92 | Temp 97.4°F | Resp 18 | Ht 71.0 in | Wt 225.0 lb

## 2022-10-23 DIAGNOSIS — M170 Bilateral primary osteoarthritis of knee: Secondary | ICD-10-CM | POA: Diagnosis not present

## 2022-10-23 DIAGNOSIS — M47816 Spondylosis without myelopathy or radiculopathy, lumbar region: Secondary | ICD-10-CM | POA: Diagnosis not present

## 2022-10-23 DIAGNOSIS — G894 Chronic pain syndrome: Secondary | ICD-10-CM | POA: Insufficient documentation

## 2022-10-23 DIAGNOSIS — M25561 Pain in right knee: Secondary | ICD-10-CM | POA: Diagnosis not present

## 2022-10-23 DIAGNOSIS — G8929 Other chronic pain: Secondary | ICD-10-CM | POA: Diagnosis present

## 2022-10-23 DIAGNOSIS — M25562 Pain in left knee: Secondary | ICD-10-CM | POA: Insufficient documentation

## 2022-10-23 NOTE — Progress Notes (Signed)
PROVIDER NOTE: Information contained herein reflects review and annotations entered in association with encounter. Interpretation of such information and data should be left to medically-trained personnel. Information provided to patient can be located elsewhere in the medical record under "Patient Instructions". Document created using STT-dictation technology, any transcriptional errors that may result from process are unintentional.    Patient: Peter Webster  Service Category: E/M  Provider: Edward Jolly, MD  DOB: 09-27-1940  DOS: 10/23/2022  Referring Provider: [facility]  MRN: [id number]  Specialty: Interventional Pain Management  PCP: [facility]  Type: Established Patient  Setting: Ambulatory outpatient    Location: Office  Delivery: Face-to-face     HPI  Peter Webster, a 82 y.o. year old male, is here today because of his Bilateral chronic knee pain [M25.561, M25.562, G89.29]. [name] primary complain today is Knee Pain  Pertinent problems: [name] has Multiple myeloma not having achieved remission; Thrombocytopenia; Wedge compression fracture of t11-T12 vertebra, sequela; Chronic bilateral low back pain with left-sided sciatica; Lumbar degenerative disc disease; Lumbar spondylosis; Chronic radicular lumbar pain; Chronic pain syndrome; Compression fracture of thoracic vertebra; and Thoracic spondylosis on their pertinent problem list. Pain Assessment: Severity of Chronic pain is reported as a 9 /10. Location: Knee Right/Radiates to outer knee down to mid lower leg. Onset: More than a month ago. Quality: Constant, Sharp, Aching. Timing: Constant. Modifying factor(s): Knee compression sleeve. Vitals:  height is  (1.803 m) and weight is 225 lb (102.1 kg). His temporal temperature is 97.4 F (36.3 C) (abnormal). His blood pressure is 127/81 and his pulse is 92. His respiration is 18 and oxygen saturation is 100%.  BMI: Estimated body mass index is 31.38 kg/m as  calculated from the following:   Height as of this encounter:  (1.803 m).   Weight as of this encounter: 225 lb (102.1 kg). Last encounter: 07/10/2022. Last procedure: 04/30/2022.  Reason for encounter: evaluation of worsening, or previously known (established) problem.   Increased bilateral knee pain due to osteoarthritis Has has IA knee steroid injections the past that helped Is going to [place] for an all day veteran's day event. 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 and will be doing a lot of walking I informed him I am out of clinic next week but that my partner Dr Shireen Quan could likely do bilateral knee steroid injections I informed him we'll discuss hyalgan injections/ viscosupplemenation next month when he follows up with me Patient would like to like to discuss second round of diagnostic lumbar facet medial branch nerve blocks when he follows up next month    ROS  Constitutional: Denies any fever or chills Gastrointestinal: No reported hemesis, hematochezia, vomiting, or acute GI distress Musculoskeletal:  bilateral knee pain Neurological: No reported episodes of acute onset apraxia, aphasia, dysarthria, agnosia, amnesia, paralysis, loss of coordination, or loss of consciousness  Medication Review  Abdominal Binder/Elastic XL, Calcium Carb-Cholecalciferol, DSS, acetaminophen, acyclovir, amLODipine, carbidopa-levodopa, cetirizine, clobetasol ointment, gabapentin, hydrocortisone, ibuprofen, lidocaine, omeprazole, sildenafil, simvastatin, tiZANidine, triamcinolone ointment, and urea  History Review  Allergy: Peter Webster is allergic to lisinopril. Drug: Peter Webster  reports no history of drug use. Alcohol:  reports current alcohol use of about 1.0 standard drink of alcohol per week. Tobacco:  reports that he has never smoked. He has never used smokeless tobacco. Social: Peter Webster  reports that he has never smoked. He has never used smokeless tobacco. He reports current alcohol use of about  1.0 standard drink of alcohol per  week. He reports that he does not use drugs. Medical:  has a past medical history of Acute kidney failure, Allergy, Arthritis, Bladder cancer, Chronic pain syndrome, DDD (degenerative disc disease), lumbar, ED (erectile dysfunction), Elevated PSA, GERD (gastroesophageal reflux disease), Headache, Hernia of anterior abdominal wall, Hyperlipidemia, Hypertension, Multiple myeloma not having achieved remission, Right inguinal hernia, Sleep apnea, Thrombocytopenia, and Wedge compression fracture of t11-T12 vertebra, sequela. Surgical: Peter Webster  has a past surgical history that includes Colonoscopy (2019); bilateral hernia (2018); XI robotic assisted ventral hernia (N/A, 12/18/2021); and Insertion of mesh (12/18/2021). Family: family history includes Cervical cancer in his mother; Stroke in his mother.  Laboratory Chemistry Profile   Renal Lab Results  Component Value Date   BUN 22 11/20/2021   CREATININE 1.96 (H) 11/20/2021   BCR 11 11/20/2021    Hepatic Lab Results  Component Value Date   AST 18 11/20/2021   ALBUMIN 4.6 11/20/2021   ALKPHOS 82 11/20/2021    Electrolytes Lab Results  Component Value Date   NA 142 11/20/2021   K 3.7 11/20/2021   CL 104 11/20/2021   CALCIUM 10.1 11/20/2021   MG 2.0 11/20/2021    Bone Lab Results  Component Value Date   25OHVITD1 38 11/20/2021   25OHVITD2 1.0 11/20/2021   25OHVITD3 37 11/20/2021    Inflammation (CRP: Acute Phase) (ESR: Chronic Phase) No results found for: "CRP", "ESRSEDRATE", "LATICACIDVEN"       Note: Above Lab results reviewed.   Physical Exam  General appearance: Well nourished, well developed, and well hydrated. In no apparent acute distress Mental status: Alert, oriented x 3 (person, place, & time)       Respiratory: No evidence of acute respiratory distress Eyes: PERLA Vitals: BP 127/81   Pulse 92   Temp (!) 97.4 F (36.3 C) (Temporal)   Resp 18   Ht 5\' 11"  (1.803 m)   Wt 225 lb  (102.1 kg)   SpO2 100%   BMI 31.38 kg/m  BMI: Estimated body mass index is 31.38 kg/m as calculated from the following:   Height as of this encounter: 5\' 11"  (1.803 m).   Weight as of this encounter: 225 lb (102.1 kg). Ideal: Ideal body weight: 75.3 kg (166 lb 0.1 oz) Adjusted ideal body weight: 86 kg (189 lb 9.7 oz)  Lumbar pain with facet loading and lumbar extension  Bilateral knee pain, worse with weightbearing   Assessment   Diagnosis Status  1. Bilateral chronic knee pain   2. Bilateral primary osteoarthritis of knee   3. Chronic pain syndrome   4. Lumbar spondylosis   5. Lumbar facet arthropathy    Having a Flare-up Persistent Controlled   Updated Problems: Problem  Bilateral Chronic Knee Pain  Bilateral Primary Osteoarthritis of Knee    Plan of Care   Orders:  Orders Placed This Encounter  Procedures   KNEE INJECTION    Local Anesthetic & Steroid injection.    Standing Status:   Future    Standing Expiration Date:   01/22/2023    Scheduling Instructions:     Side: Bilateral     Sedation: None     Timeframe: As soon as schedule allows    Order Specific Question:   Where will this procedure be performed?    Answer:   [facility]    In regards to his axial low back pain related to lumbar facet arthropathy, he is status post diagnostic lumbar facet medial branch nerve blocks bilaterally at L3, L4,  L5 on 04/30/2022.  This provided him with approximately 60% pain relief for 8 weeks.  He would like to explore the neck steps in the treatment algorithm which I explained would be a second set of confirmatory diagnostic lumbar facet medial branch nerve blocks and then potential lumbar radiofrequency ablation.  Patient endorsed understanding.  Follow-up plan:   Return in about 1 month (around 11/25/2022) for Post Procedure Evaluation, in person.     Recent Visits No visits were found meeting these conditions. Showing recent visits within past 90 days and  meeting all other requirements Today's Visits Date Type Provider Dept  10/23/22 Office Visit Edward Jolly, MD [facility]  Showing today's visits and meeting all other requirements Future Appointments No visits were found meeting these conditions. Showing future appointments within next 90 days and meeting all other requirements  I discussed the assessment and treatment plan with the patient. The patient was provided an opportunity to ask questions and all were answered. The patient agreed with the plan and demonstrated an understanding of the instructions.  Patient advised to call back or seek an in-person evaluation if the symptoms or condition worsens.  Duration of encounter: .  Total time on encounter, as per AMA guidelines included both the face-to-face and non-face-to-face time personally spent by the physician and/or other qualified health care professional(s) on the day of the encounter (includes time in activities that require the physician or other qualified health care professional and does not include time in activities normally performed by clinical staff). Physician's time may include the following activities when performed: Preparing to see the patient (e.g., pre-charting review of records, searching for previously ordered imaging, lab work, and nerve conduction tests) Review of prior analgesic pharmacotherapies. Reviewing PMP Interpreting ordered tests (e.g., lab work, imaging, nerve conduction tests) Performing post-procedure evaluations, including interpretation of diagnostic procedures Obtaining and/or reviewing separately obtained history Performing a medically appropriate examination and/or evaluation Counseling and educating the patient/family/caregiver Ordering medications, tests, or procedures Referring and communicating with other health care professionals (when not separately reported) Documenting clinical information in the electronic or other  health record Independently interpreting results (not separately reported) and communicating results to the patient/ family/caregiver Care coordination (not separately reported)  Note by: Edward Jolly, MD Date: 10/23/2022; Time: 2:12 PM

## 2022-10-23 NOTE — Progress Notes (Signed)
Safety precautions to be maintained throughout the outpatient stay will include: orient to surroundings, keep bed in low position, maintain call bell within reach at all times, provide assistance with transfer out of bed and ambulation.  

## 2022-10-23 NOTE — Patient Instructions (Signed)

## 2022-10-28 ENCOUNTER — Ambulatory Visit: Payer: No Typology Code available for payment source | Attending: Pain Medicine | Admitting: Pain Medicine

## 2022-10-28 ENCOUNTER — Encounter: Payer: Self-pay | Admitting: Pain Medicine

## 2022-10-28 VITALS — BP 141/86 | HR 78 | Temp 97.2°F | Resp 18 | Ht 71.0 in | Wt 214.0 lb

## 2022-10-28 DIAGNOSIS — M170 Bilateral primary osteoarthritis of knee: Secondary | ICD-10-CM | POA: Diagnosis present

## 2022-10-28 DIAGNOSIS — H40023 Open angle with borderline findings, high risk, bilateral: Secondary | ICD-10-CM | POA: Insufficient documentation

## 2022-10-28 DIAGNOSIS — L609 Nail disorder, unspecified: Secondary | ICD-10-CM | POA: Insufficient documentation

## 2022-10-28 DIAGNOSIS — I25119 Atherosclerotic heart disease of native coronary artery with unspecified angina pectoris: Secondary | ICD-10-CM | POA: Insufficient documentation

## 2022-10-28 DIAGNOSIS — D090 Carcinoma in situ of bladder: Secondary | ICD-10-CM | POA: Insufficient documentation

## 2022-10-28 DIAGNOSIS — I10 Essential (primary) hypertension: Secondary | ICD-10-CM | POA: Insufficient documentation

## 2022-10-28 DIAGNOSIS — M25562 Pain in left knee: Secondary | ICD-10-CM | POA: Insufficient documentation

## 2022-10-28 DIAGNOSIS — G8929 Other chronic pain: Secondary | ICD-10-CM | POA: Diagnosis present

## 2022-10-28 DIAGNOSIS — L300 Nummular dermatitis: Secondary | ICD-10-CM | POA: Insufficient documentation

## 2022-10-28 DIAGNOSIS — H6123 Impacted cerumen, bilateral: Secondary | ICD-10-CM | POA: Insufficient documentation

## 2022-10-28 DIAGNOSIS — M25561 Pain in right knee: Secondary | ICD-10-CM

## 2022-10-28 DIAGNOSIS — G629 Polyneuropathy, unspecified: Secondary | ICD-10-CM | POA: Insufficient documentation

## 2022-10-28 DIAGNOSIS — H524 Presbyopia: Secondary | ICD-10-CM | POA: Insufficient documentation

## 2022-10-28 DIAGNOSIS — M25552 Pain in left hip: Secondary | ICD-10-CM | POA: Insufficient documentation

## 2022-10-28 DIAGNOSIS — Z659 Problem related to unspecified psychosocial circumstances: Secondary | ICD-10-CM | POA: Insufficient documentation

## 2022-10-28 DIAGNOSIS — H0011 Chalazion right upper eyelid: Secondary | ICD-10-CM | POA: Insufficient documentation

## 2022-10-28 DIAGNOSIS — J309 Allergic rhinitis, unspecified: Secondary | ICD-10-CM | POA: Insufficient documentation

## 2022-10-28 DIAGNOSIS — C679 Malignant neoplasm of bladder, unspecified: Secondary | ICD-10-CM | POA: Insufficient documentation

## 2022-10-28 DIAGNOSIS — R7309 Other abnormal glucose: Secondary | ICD-10-CM | POA: Insufficient documentation

## 2022-10-28 DIAGNOSIS — M545 Low back pain, unspecified: Secondary | ICD-10-CM | POA: Insufficient documentation

## 2022-10-28 DIAGNOSIS — S22000S Wedge compression fracture of unspecified thoracic vertebra, sequela: Secondary | ICD-10-CM | POA: Insufficient documentation

## 2022-10-28 DIAGNOSIS — Z01818 Encounter for other preprocedural examination: Secondary | ICD-10-CM | POA: Insufficient documentation

## 2022-10-28 DIAGNOSIS — D696 Thrombocytopenia, unspecified: Secondary | ICD-10-CM | POA: Insufficient documentation

## 2022-10-28 DIAGNOSIS — Z5181 Encounter for therapeutic drug level monitoring: Secondary | ICD-10-CM | POA: Insufficient documentation

## 2022-10-28 DIAGNOSIS — D61818 Other pancytopenia: Secondary | ICD-10-CM | POA: Insufficient documentation

## 2022-10-28 DIAGNOSIS — R338 Other retention of urine: Secondary | ICD-10-CM | POA: Insufficient documentation

## 2022-10-28 DIAGNOSIS — Z5111 Encounter for antineoplastic chemotherapy: Secondary | ICD-10-CM | POA: Insufficient documentation

## 2022-10-28 DIAGNOSIS — Z77098 Contact with and (suspected) exposure to other hazardous, chiefly nonmedicinal, chemicals: Secondary | ICD-10-CM | POA: Insufficient documentation

## 2022-10-28 DIAGNOSIS — Z0120 Encounter for dental examination and cleaning without abnormal findings: Secondary | ICD-10-CM | POA: Insufficient documentation

## 2022-10-28 DIAGNOSIS — B351 Tinea unguium: Secondary | ICD-10-CM | POA: Insufficient documentation

## 2022-10-28 DIAGNOSIS — Z711 Person with feared health complaint in whom no diagnosis is made: Secondary | ICD-10-CM | POA: Insufficient documentation

## 2022-10-28 MED ORDER — METHYLPREDNISOLONE ACETATE 80 MG/ML IJ SUSP
80.0000 mg | Freq: Once | INTRAMUSCULAR | Status: AC
  Administered 2022-10-28: 80 mg via INTRA_ARTICULAR
  Filled 2022-10-28: qty 1

## 2022-10-28 MED ORDER — ROPIVACAINE HCL 2 MG/ML IJ SOLN
3.0000 mL | Freq: Once | INTRAMUSCULAR | Status: AC
  Administered 2022-10-28: 3 mL via INTRA_ARTICULAR

## 2022-10-28 MED ORDER — METHYLPREDNISOLONE ACETATE 80 MG/ML IJ SUSP
INTRAMUSCULAR | Status: AC
  Filled 2022-10-28: qty 1

## 2022-10-28 MED ORDER — LIDOCAINE HCL (PF) 2 % IJ SOLN
5.0000 mL | Freq: Once | INTRAMUSCULAR | Status: AC
  Administered 2022-10-28: 5 mL
  Filled 2022-10-28: qty 5

## 2022-10-28 MED ORDER — PENTAFLUOROPROP-TETRAFLUOROETH EX AERO
INHALATION_SPRAY | Freq: Once | CUTANEOUS | Status: AC
  Administered 2022-10-28: 1 via TOPICAL
  Filled 2022-10-28: qty 116

## 2022-10-28 MED ORDER — METHYLPREDNISOLONE ACETATE 80 MG/ML IJ SUSP
80.0000 mg | Freq: Once | INTRAMUSCULAR | Status: AC
  Administered 2022-10-28: 80 mg via INTRA_ARTICULAR

## 2022-10-28 MED ORDER — ROPIVACAINE HCL 2 MG/ML IJ SOLN
3.0000 mL | Freq: Once | INTRAMUSCULAR | Status: AC
  Administered 2022-10-28: 3 mL via INTRA_ARTICULAR
  Filled 2022-10-28: qty 20

## 2022-10-28 NOTE — Progress Notes (Signed)
Safety precautions to be maintained throughout the outpatient stay will include: orient to surroundings, keep bed in low position, maintain call bell within reach at all times, provide assistance with transfer out of bed and ambulation.  

## 2022-10-28 NOTE — Progress Notes (Signed)
PROVIDER NOTE: Interpretation of information contained herein should be left to medically-trained personnel. Specific patient instructions are provided elsewhere under "Patient Instructions" section of medical record. This document was created in part using STT-dictation technology, any transcriptional errors that may result from this process are unintentional.  Patient: Peter Webster Type: Established DOB: 19-Jun-1941 MRN: [id number] PCP: [facility]  Service: Procedure DOS: 10/28/2022 Setting: Ambulatory Location: Ambulatory outpatient facility Delivery: Face-to-face Provider: Oswaldo Done, MD Specialty: Interventional Pain Management Specialty designation: 09 Location: Outpatient facility Ref. Prov.: [facility]       Interventional Therapy   Procedure:           Type: Steroid Intra-articular Knee Injection #1  Laterality: Bilateral (-50) Level/approach: Lateral Imaging guidance: None required ([id number]) Anesthesia: Local anesthesia (1-2% Lidocaine) Anxiolysis: None                 Sedation: No Sedation                       DOS: 10/28/2022  Performed by: Oswaldo Done, MD  Purpose: Diagnostic/Therapeutic Indications: Knee arthralgia associated to osteoarthritis of the knee 1. Bilateral chronic knee pain   2. Bilateral primary osteoarthritis of knee   3. 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  Risk Assessment: Vitals:  [id number] body mass index is 29.85 kg/m as calculated from the following:   Height as of this encounter:  (1.803 m).   Weight as of this encounter: 214 lb (97.1 kg)., Rate:78 , BP:(!) 141/86, Resp:18, Temp:(!) 97.2 F (36.2 C), SpO2:99 %  Allergies: He is allergic to lisinopril.  Precautions: No additional precautions required  Blood-thinner(s): None at this time   Coagulopathies: Reviewed. None identified.   Active Infection(s): Reviewed. None identified. Peter Webster is afebrile   Location setting: Exam room Position: Sitting w/ knee bent 90 degrees Safety Precautions: Patient was assessed for positional comfort and pressure points before starting the procedure. Prepping solution: DuraPrep (Iodine Povacrylex [0.7% available iodine] and Isopropyl Alcohol, 74% w/w) Prep Area: Entire knee region Approach: percutaneous, just above the tibial plateau, lateral to the infrapatellar tendon. Intended target: Intra-articular knee space Materials: Tray: Block Needle(s): Regular Qty: 1/side Length: 1.5-inch Gauge: 25G (x1) + 22G (x1)  Meds ordered this encounter  Medications   pentafluoroprop-tetrafluoroeth (GEBAUERS) aerosol   lidocaine HCl (PF) (XYLOCAINE) 2 % injection 5 mL   ropivacaine (PF) 2 mg/mL (0.2%) (NAROPIN) injection 3 mL   methylPREDNISolone acetate (DEPO-MEDROL) injection 80 mg   methylPREDNISolone acetate (DEPO-MEDROL) injection 80 mg   ropivacaine (PF) 2 mg/mL (0.2%) (NAROPIN) injection 3 mL    Orders Placed This Encounter  Procedures   KNEE INJECTION    Local Anesthetic & Steroid injection.    Scheduling Instructions:     Side(s): Bilateral Knee     Sedation: None     Timeframe: Today    Order Specific Question:   Where will this procedure be performed?    Answer:   [facility]   Informed Consent Details: Physician/Practitioner Attestation; Transcribe to consent form and obtain patient signature    Note: Always confirm laterality of pain with Peter Webster, before procedure. Transcribe to consent form and obtain patient signature.    Order Specific Question:   Physician/Practitioner attestation of informed consent for procedure/surgical case    Answer:  I, the physician/practitioner, attest that I have discussed with the patient the benefits, risks, side effects, alternatives, likelihood of achieving goals and potential  problems during recovery for the procedure that I have provided informed consent.    Order Specific Question:   Procedure    Answer:   Bilateral intra-articular knee arthrocentesis (aspiration and/or injection)    Order Specific Question:   Physician/Practitioner performing the procedure    Answer:   Jiyaan Steinhauser A. Laban Emperor, MD    Order Specific Question:   Indication/Reason    Answer:   Chronic bilateral knee pain secondary to knee arthropathy/arthralgia   Provide equipment / supplies at bedside    Procedure tray: "Block Tray" (Disposable  single use) Skin infiltration needle: Regular 1.5-in, 25-G, (x1) Block Needle type: Regular Amount/quantity: 2 Size: Short(1.5-inch) Gauge: (25G x1) + (22G x1)    Standing Status:   Standing    Number of Occurrences:   1    Order Specific Question:   Specify    Answer:   Block Tray     Time-out: 1139 I initiated and conducted the "Time-out" before starting the procedure, as per protocol. The patient was asked to participate by confirming the accuracy of the "Time Out" information. Verification of the correct person, site, and procedure were performed and confirmed by me, the nursing staff, and the patient. "Time-out" conducted as per Joint Commission's Universal Protocol (UP.01.01.01). Procedure checklist: Completed  H&P (Pre-op  Assessment)  Peter Webster is a 82 y.o. (year old), male patient, seen today for interventional treatment. He  has a past surgical history that includes Colonoscopy (2019); bilateral hernia (2018); XI robotic assisted ventral hernia (N/A, 12/18/2021); and Insertion of mesh (12/18/2021). Peter Webster has a current medication list which includes the following prescription(s): acetaminophen, acyclovir, amlodipine, calcium carb-cholecalciferol, carbidopa-levodopa, cetirizine, clobetasol ointment, dss, abdominal binder/elastic xl, hydrocortisone, hydrocortisone, ibuprofen, lidocaine, omeprazole, sildenafil, simvastatin, tizanidine,  triamcinolone ointment, urea, and gabapentin. His primarily concern today is the Knee Pain  He is allergic to lisinopril.   Last encounter: My last encounter with him was on Visit date not found. Pertinent problems: Mr. Mccalla has Multiple myeloma not having achieved remission; Wedge compression fracture of t11-T12 vertebra, sequela; Bladder cancer; Chronic bilateral low back pain with left-sided sciatica; Lumbar degenerative disc disease; Lumbar spondylosis; Chronic radicular lumbar pain; Chronic pain syndrome; Compression fracture of thoracic vertebra; Thoracic spondylosis; Bilateral chronic knee pain; Bilateral primary osteoarthritis of knee; Malignant neoplasm of bladder; Pain in left hip; Carcinoma in situ of bladder; Low back pain, unspecified; Low back pain; Wedge compression fracture of unspecified thoracic vertebra, sequela; and 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 Pain Assessment: Severity of Chronic pain is reported as a 9 /10. Location: Knee Right, Left/Radiates to outer knee down to mid lower leg bilateral. Onset: More than a month ago. Quality: Sharp, Constant, Aching. Timing: Constant. Modifying factor(s): Knee compression sleeve. Vitals:  height is 5\' 11"  (1.803 m) and weight is 214 lb (97.1 kg). His temporal temperature is 97.2 F (36.2 C) (abnormal). His blood pressure is 141/86 (abnormal) and his pulse is 78. His respiration is 18 and oxygen saturation is 99%.   Reason for encounter: "interventional pain management therapy due pain of at least four (4) weeks in duration, with failure to respond and/or inability to tolerate more conservative care.  Site Confirmation: Mr. Scerbo was asked to confirm the procedure and laterality before marking the site.  Consent: Before the procedure and under the influence of no sedative(s), amnesic(s),  or anxiolytics, the patient was informed of the treatment options, risks and possible complications. To  fulfill our ethical and legal obligations, as recommended by the American Medical Association's Code of Ethics, I have informed the patient of my clinical impression; the nature and purpose of the treatment or procedure; the risks, benefits, and possible complications of the intervention; the alternatives, including doing nothing; the risk(s) and benefit(s) of the alternative treatment(s) or procedure(s); and the risk(s) and benefit(s) of doing nothing. The patient was provided information about the general risks and possible complications associated with the procedure. These may include, but are not limited to: failure to achieve desired goals, infection, bleeding, organ or nerve damage, allergic reactions, paralysis, and death. In addition, the patient was informed of those risks and complications associated to Spine-related procedures, such as failure to decrease pain; infection (i.e.: Meningitis, epidural or intraspinal abscess); bleeding (i.e.: epidural hematoma, subarachnoid hemorrhage, or any other type of intraspinal or peri-dural bleeding); organ or nerve damage (i.e.: Any type of peripheral nerve, nerve root, or spinal cord injury) with subsequent damage to sensory, motor, and/or autonomic systems, resulting in permanent pain, numbness, and/or weakness of one or several areas of the body; allergic reactions; (i.e.: anaphylactic reaction); and/or death. Furthermore, the patient was informed of those risks and complications associated with the medications. These include, but are not limited to: allergic reactions (i.e.: anaphylactic or anaphylactoid reaction(s)); adrenal axis suppression; blood sugar elevation that in diabetics may result in ketoacidosis or comma; water retention that in patients with history of congestive heart failure may result in shortness of breath, pulmonary edema, and decompensation with resultant heart failure; weight gain; swelling or edema; medication-induced neural toxicity;  particulate matter embolism and blood vessel occlusion with resultant organ, and/or nervous system infarction; and/or aseptic necrosis of one or more joints. Finally, the patient was informed that Medicine is not an exact science; therefore, there is also the possibility of unforeseen or unpredictable risks and/or possible complications that may result in a catastrophic outcome. The patient indicated having understood very clearly. We have given the patient no guarantees and we have made no promises. Enough time was given to the patient to ask questions, all of which were answered to the patient's satisfaction. Mr. Dunklee has indicated that he wanted to continue with the procedure. Attestation: I, the ordering provider, attest that I have discussed with the patient the benefits, risks, side-effects, alternatives, likelihood of achieving goals, and potential problems during recovery for the procedure that I have provided informed consent.  Date  Time: 10/28/2022 11:17 AM  Description of procedure  Start Time: 1140 hrs  Local Anesthesia: Once the patient was positioned, prepped, and time-out was completed. The target area was identified located. The skin was marked with an approved surgical skin marker. Once marked, the skin (epidermis, dermis, and hypodermis), and deeper tissues (fat, connective tissue and muscle) were infiltrated with a small amount of a short-acting local anesthetic, loaded on a 10cc syringe with a 25G, 1.5-in  Needle. An appropriate amount of time was allowed for local anesthetics to take effect before proceeding to the next step. Local Anesthetic: Lidocaine 1-2% The unused portion of the local anesthetic was discarded in the proper designated containers. Safety Precautions: Aspiration looking for blood return was conducted prior to all injections. At no point did I inject any substances, as a needle was being advanced. Before injecting, the patient was told to immediately notify me if  he was experiencing any new onset of "ringing in the  ears, or metallic taste in the mouth". No attempts were made at seeking any paresthesias. Safe injection practices and needle disposal techniques used. Medications properly checked for expiration dates. SDV (single dose vial) medications used. After the completion of the procedure, all disposable equipment used was discarded in the proper designated medical waste containers.  Technical description: Protocol guidelines were followed. After positioning, the target area was identified and prepped in the usual manner. Skin & deeper tissues infiltrated with local anesthetic. Appropriate amount of time allowed to pass for local anesthetics to take effect. Proper needle placement secured. Once satisfactory needle placement was confirmed, I proceeded to inject the desired solution in slow, incremental fashion, intermittently assessing for discomfort or any signs of abnormal or undesired spread of substance. Once completed, the needle was removed and disposed of, as per hospital protocols. The area was cleaned, making sure to leave some of the prepping solution back to take advantage of its long term bactericidal properties.  Aspiration:  Negative        Vitals:   10/28/22 1116  BP: (!) 141/86  Pulse: 78  Resp: 18  Temp: (!) 97.2 F (36.2 C)  TempSrc: Temporal  SpO2: 99%  Weight: 214 lb (97.1 kg)  Height: 5\' 11"  (1.803 m)    End Time: 1144 hrs  Imaging guidance  Imaging-assisted Technique: None required. Indication(s): N/A Exposure Time: N/A Contrast: None Fluoroscopic Guidance: N/A Ultrasound Guidance: N/A Interpretation: N/A  Post-op assessment  Post-procedure Vital Signs:  Pulse/HCG Rate: 78  Temp: (!) 97.2 F (36.2 C) Resp: 18 BP: (!) 141/86 SpO2: 99 %  EBL: None  Complications: No immediate post-treatment complications observed by team, or reported by patient.  Note: The patient tolerated the entire procedure well. A  repeat set of vitals were taken after the procedure and the patient was kept under observation following institutional policy, for this type of procedure. Post-procedural neurological assessment was performed, showing return to baseline, prior to discharge. The patient was provided with post-procedure discharge instructions, including a section on how to identify potential problems. Should any problems arise concerning this procedure, the patient was given instructions to immediately contact us, at any time, without hesitation. In any case, we plan to contact the patient by telephone for a follow-up status report regarding this interventional procedure.  Comments:  No additional relevant information.  Plan of care  Chronic Opioid Analgesic:   No chronic opioid analgesics therapy prescribed by our practice.   Medications administered: We administered pentafluoroprop-tetrafluoroeth, lidocaine HCl (PF), ropivacaine (PF) 2 mg/mL (0.2%), methylPREDNISolone acetate, methylPREDNISolone acetate, and ropivacaine (PF) 2 mg/mL (0.2%).  Follow-up plan:   Return in about 2 weeks (around 11/11/2022) for (Face2F), (PPE) with Dr. Edward Jolly.       Interventional Therapies  Risk Factors  Considerations:  Open Angle Glaucoma  CAD  GERD  OSA  Thrombocytopenia  Pancytopenia  HTN  Hx. Acute Kidney Failure  Bladder Cancer     Planned  Pending:      Under consideration:   Diagnostic bilateral knee joint x-rays   Completed:   Diagnostic bilateral lumbar facet (L3-L5) MBB x1 (04/30/2022) (100/100/100 x 3 days / 50-60% x 8 weeks)  Diagnostic/therapeutic bilateral IA steroid knee injection #1 (10/28/2022) by Dr. Laban Emperor (9-0/10)    Therapeutic  Palliative (PRN) options:   None established   Completed by other providers:   Dx. EMG/PNCV (03/11/2022) by [facility]) Dx: Generalized severe sensorimotor polyneuropathy      Recent Visits Date Type Provider  Dept  10/23/22 Office Visit  Edward Jolly, MD [facility]  Showing recent visits within past 90 days and meeting all other requirements Today's Visits Date Type Provider Dept  10/28/22 Procedure visit Delano Metz, MD [facility]  Showing today's visits and meeting all other requirements Future Appointments Date Type Provider Dept  11/17/22 Appointment Edward Jolly, MD [facility]  Showing future appointments within next 90 days and meeting all other requirements   Disposition: Discharge home  Discharge (Date  Time): 10/28/2022; 1147 hrs.   Primary Care Physician: [facility] Location: [facility] Note by: Oswaldo Done, MD Date: 10/28/2022; Time: 12:24 PM  DISCLAIMER: Medicine is not an Visual merchandiser. It has no guarantees or warranties. The decision to proceed with this intervention was based on the information collected from the patient. Conclusions were drawn from the patient's questionnaire, interview, and examination. Because information was provided in large part by the patient, it cannot be guaranteed that it has not been purposely or unconsciously manipulated or altered. Every effort has been made to obtain as much accurate, relevant, available data as possible. Always take into account that the treatment will also be dependent on availability of resources and existing treatment guidelines, considered by other Pain Management Specialists as being common knowledge and practice, at the time of the intervention. It is also important to point out that variation in procedural techniques and pharmacological choices are the acceptable norm. For Medico-Legal review purposes, the indications, contraindications, technique, and results of the these procedures should only be evaluated, judged and interpreted by a Board-Certified Interventional Pain Specialist with extensive familiarity and expertise in the same exact procedure and technique.

## 2022-10-28 NOTE — Patient Instructions (Signed)

## 2022-10-29 ENCOUNTER — Telehealth: Payer: Self-pay | Admitting: *Deleted

## 2022-10-29 NOTE — Telephone Encounter (Signed)
Attempted to call for post procedure follow-up. Message left. 

## 2022-11-17 ENCOUNTER — Ambulatory Visit
Payer: No Typology Code available for payment source | Admitting: Student in an Organized Health Care Education/Training Program

## 2022-11-24 ENCOUNTER — Encounter: Payer: Self-pay | Admitting: Student in an Organized Health Care Education/Training Program

## 2022-11-24 ENCOUNTER — Ambulatory Visit
Payer: No Typology Code available for payment source | Attending: Student in an Organized Health Care Education/Training Program | Admitting: Student in an Organized Health Care Education/Training Program

## 2022-11-24 VITALS — HR 66 | Temp 97.2°F | Resp 16 | Ht 71.0 in | Wt 223.0 lb

## 2022-11-24 DIAGNOSIS — M47816 Spondylosis without myelopathy or radiculopathy, lumbar region: Secondary | ICD-10-CM

## 2022-11-24 DIAGNOSIS — G8929 Other chronic pain: Secondary | ICD-10-CM | POA: Diagnosis present

## 2022-11-24 DIAGNOSIS — M25562 Pain in left knee: Secondary | ICD-10-CM

## 2022-11-24 DIAGNOSIS — M25561 Pain in right knee: Secondary | ICD-10-CM | POA: Diagnosis present

## 2022-11-24 DIAGNOSIS — M170 Bilateral primary osteoarthritis of knee: Secondary | ICD-10-CM | POA: Diagnosis present

## 2022-11-24 DIAGNOSIS — G894 Chronic pain syndrome: Secondary | ICD-10-CM | POA: Diagnosis present

## 2022-11-24 NOTE — Progress Notes (Signed)
Safety precautions to be maintained throughout the outpatient stay will include: orient to surroundings, keep bed in low position, maintain call bell within reach at all times, provide assistance with transfer out of bed and ambulation.  

## 2022-11-24 NOTE — Progress Notes (Signed)
PROVIDER NOTE: Information contained herein reflects review and annotations entered in association with encounter. Interpretation of such information and data should be left to medically-trained personnel. Information provided to patient can be located elsewhere in the medical record under "Patient Instructions". Document created using STT-dictation technology, any transcriptional errors that may result from process are unintentional.    Patient: Peter Webster  Service Category: E/M  Provider: Edward Jolly, MD  DOB: 1940/09/15  DOS: 11/24/2022  Referring Provider: [facility]  MRN: [id number]  Specialty: Interventional Pain Management  [facility]P: [facility]  Type: Established Patient  Setting: Ambulatory outpatient    Location: Office  Delivery: Face-to-face     HPI  [name], a 82 y.o. year old male, is here today because of his Bilateral chronic knee pain [M25.561, M25.562, G89.29]. Mr. Hartsock primary complain today is Knee Pain (bilateral)  Pertinent problems: Mr. Loschiavo has Multiple myeloma not having achieved remission (HCC); Thrombocytopenia (HCC); Wedge compression fracture of t11-T12 vertebra, sequela; Chronic bilateral low back pain with left-sided sciatica; Lumbar degenerative disc disease; Lumbar spondylosis; Chronic radicular lumbar pain; Chronic pain syndrome; Compression fracture of thoracic vertebra (HCC); and Thoracic spondylosis on their pertinent problem list. Pain Assessment: Severity of Chronic pain is reported as a 8 /10. Location: Knee Right, Left (right one is worse)/ . Onset: More than a month ago. Quality: Aching. Timing: Constant. Modifying factor(s): tylenol. Vitals:  height is 5\' 11"  (1.803 m) and weight is 223 lb (101.2 kg). His temperature is 97.2 F (36.2 C) (abnormal). His pulse is 66. His respiration is 16 and oxygen saturation is 98%.  BMI: Estimated body mass index is 31.1 kg/m as calculated from the following:   Height as of this encounter: 5\' 11"   (1.803 m).   Weight as of this encounter: 223 lb (101.2 kg). Last encounter: 10/23/2022. Last procedure: 04/30/2022.  Reason for encounter: post-procedure evaluation and assessment.   Post-procedure evaluation   Type: Lumbar Facet, Medial Branch Block(s) #1  Laterality: Bilateral  Level: L3, L4, L5, Medial Branch Level(s). Injecting these levels blocks the L3-4 and L4-5 lumbar facet joints. Imaging: Fluoroscopic guidance         Anesthesia: Local anesthesia (1-2% Lidocaine) DOS: 04/30/2022 Performed by: Edward Jolly, MD  Primary Purpose: Diagnostic/Therapeutic Indications: Low back pain severe enough to impact quality of life or function. 1. Lumbar facet arthropathy   2. Lumbar spondylosis   3. Chronic pain syndrome    NAS-11 Pain score:   Pre-procedure: 6 /10   Post-procedure: 6 /10       Effectiveness:  Initial hour after procedure: 100 %  Subsequent 4-6 hours post-procedure: 100 %  Analgesia past initial 6 hours: 100 % (lasting a couple of weeks)  Ongoing improvement:  Analgesic:  70% Function: Mr. Anne reports improvement in function ROM: Mr. Salvati reports improvement in ROM     ROS  Constitutional: Denies any fever or chills Gastrointestinal: No reported hemesis, hematochezia, vomiting, or acute GI distress Musculoskeletal:  Bilateral knee pain Neurological: No reported episodes of acute onset apraxia, aphasia, dysarthria, agnosia, amnesia, paralysis, loss of coordination, or loss of consciousness  Medication Review  Abdominal Binder/Elastic XL, Calcium Carb-Cholecalciferol, DSS, acetaminophen, acyclovir, amLODipine, carbidopa-levodopa, cetirizine, clobetasol ointment, gabapentin, hydrocortisone, ibuprofen, lidocaine, omeprazole, sildenafil, simvastatin, tiZANidine, triamcinolone ointment, and urea  History Review  Allergy: [name] is allergic to lisinopril. Drug: [name]  reports no history of drug use. Alcohol:  reports current alcohol use of about  1.0 standard drink of alcohol per  week. Tobacco:  reports that he has never smoked. He has never used smokeless tobacco. Social: Mr. Ahlstrom  reports that he has never smoked. He has never used smokeless tobacco. He reports current alcohol use of about 1.0 standard drink of alcohol per week. He reports that he does not use drugs. Medical:  has a past medical history of Acute kidney failure (HCC), Allergy, Arthritis, Bladder cancer (HCC), Chronic pain syndrome, DDD (degenerative disc disease), lumbar, ED (erectile dysfunction), Elevated PSA, GERD (gastroesophageal reflux disease), Headache, Hernia of anterior abdominal wall, Hyperlipidemia, Hypertension, Multiple myeloma not having achieved remission (HCC), Right inguinal hernia, Sleep apnea, Thrombocytopenia (HCC), and Wedge compression fracture of t11-T12 vertebra, sequela. Surgical: [name]  has a past surgical history that includes Colonoscopy (2019); bilateral hernia (2018); XI robotic assisted ventral hernia (N/A, 12/18/2021); and Insertion of mesh (12/18/2021). Family: family history includes Cervical cancer in his mother; Stroke in his mother.  Laboratory Chemistry Profile   Renal Lab Results  Component Value Date   BUN 22 11/20/2021   CREATININE 1.96 (H) 11/20/2021   BCR 11 11/20/2021    Hepatic Lab Results  Component Value Date   AST 18 11/20/2021   ALBUMIN 4.6 11/20/2021   ALKPHOS 82 11/20/2021    Electrolytes Lab Results  Component Value Date   NA 142 11/20/2021   K 3.7 11/20/2021   CL 104 11/20/2021   CALCIUM 10.1 11/20/2021   MG 2.0 11/20/2021    Bone Lab Results  Component Value Date   25OHVITD1 38 11/20/2021   25OHVITD2 1.0 11/20/2021   25OHVITD3 37 11/20/2021    Inflammation (CRP: Acute Phase) (ESR: Chronic Phase) No results found for: "CRP", "ESRSEDRATE", "LATICACIDVEN"       Note: Above Lab results reviewed.   Physical Exam  General appearance: Well nourished, well developed, and well hydrated. In no  apparent acute distress Mental status: Alert, oriented x 3 (person, place, & time)       Respiratory: No evidence of acute respiratory distress Eyes: PERLA Vitals: Pulse 66   Temp (!) 97.2 F (36.2 C)   Resp 16   Ht 5\' 11"  (1.803 m)   Wt 223 lb (101.2 kg)   SpO2 98%   BMI 31.10 kg/m  BMI: Estimated body mass index is 31.1 kg/m as calculated from the following:   Height as of this encounter: 5\' 11"  (1.803 m).   Weight as of this encounter: 223 lb (101.2 kg). Ideal: Ideal body weight: 75.3 kg (166 lb 0.1 oz) Adjusted ideal body weight: 85.6 kg (188 lb 12.9 oz)  Low back pain, worse with facet loading, improved after lumbar facet medial branch nerve block  Bilateral knee pain, worse with weightbearing, arthropathic arthralgia  Assessment   Diagnosis Status  1. Bilateral chronic knee pain   2. Bilateral primary osteoarthritis of knee   3. Lumbar facet arthropathy   4. Chronic pain syndrome    Controlled Controlled Controlled   Updated Problems: No problems updated.  Plan of Care  1. Bilateral chronic knee pain -Status post intra-articular knee steroid injection October 28, 2022.  We discussed other options which could include viscosupplementation versus genicular nerve block.  He would like to proceed with gel injections.  Will plan for bilateral intra-articular Hyalgan - KNEE INJECTION; Future  2. Bilateral primary osteoarthritis of knee -As above - KNEE INJECTION; Future  3. Lumbar facet arthropathy -Status post diagnostic lumbar facet medial branch nerve blocks on 04/30/2022 that provided him with greater than 50% pain relief that  is ongoing.  We discussed repeating lumbar facet medial branch nerve blocks #2 and consider RFA in the future  4. Chronic pain syndrome -Continue with multimodal analgesics [facility] -As needed lumbar facet medial branch nerve block -Follow-up for intra-articular knee gel injection, genicular nerve block as needed    Orders:   Orders Placed This Encounter  Procedures   KNEE INJECTION    Indications: Knee arthralgia (pain) due to osteoarthritis (OA) Imaging: None ([id number]) Position: Sitting Equipment/Materials: Block tray  1.5", 25-G (one per side)  Local anesthetic  Hyalgan (one per side)    Standing Status:   Future    Standing Expiration Date:   11/24/2023    Scheduling Instructions:     Procedure: Knee injection Hyalgan (Hyaluronan/Hyaluronic acid)     Treatment No.:   1         Level: Intra-articular     Laterality: Bilateral Knee     Sedation: Patient's choice.    Order Specific Question:   Where will this procedure be performed?    Answer:   [facility]   Follow-up plan:   Return in about 23 days (around 12/17/2022) for B/L knee hyalgan, in clinic [facility].      Recent Visits Date Type Provider Dept  10/28/22 Procedure visit Delano Metz, MD [facility]  10/23/22 Office Visit Edward Jolly, MD [facility]  Showing recent visits within past 90 days and meeting all other requirements Today's Visits Date Type Provider Dept  11/24/22 Office Visit Edward Jolly, MD [facility]  Showing today's visits and meeting all other requirements Future Appointments Date Type Provider Dept  12/24/22 Appointment Edward Jolly, MD [facility]  Showing future appointments within next 90 days and meeting all other requirements  I discussed the assessment and treatment plan with the patient. The patient was provided an opportunity to ask questions and all were answered. The patient agreed with the plan and demonstrated an understanding of the instructions.  Patient advised to call back or seek an in-person evaluation if the symptoms or condition worsens.  Duration of encounter: .  Total time on encounter, as per AMA guidelines included both the face-to-face and non-face-to-face time personally spent by the physician and/or other qualified health care  professional(s) on the day of the encounter (includes time in activities that require the physician or other qualified health care professional and does not include time in activities normally performed by clinical staff). Physician's time may include the following activities when performed: Preparing to see the patient (e.g., pre-charting review of records, searching for previously ordered imaging, lab work, and nerve conduction tests) Review of prior analgesic pharmacotherapies. Reviewing PMP Interpreting ordered tests (e.g., lab work, imaging, nerve conduction tests) Performing post-procedure evaluations, including interpretation of diagnostic procedures Obtaining and/or reviewing separately obtained history Performing a medically appropriate examination and/or evaluation Counseling and educating the patient/family/caregiver Ordering medications, tests, or procedures Referring and communicating with other health care professionals (when not separately reported) Documenting clinical information in the electronic or other health record Independently interpreting results (not separately reported) and communicating results to the patient/ family/caregiver Care coordination (not separately reported)  Note by: Edward Jolly, MD Date: 11/24/2022; Time: 3:10 PM

## 2022-11-26 ENCOUNTER — Ambulatory Visit
Payer: No Typology Code available for payment source | Admitting: Student in an Organized Health Care Education/Training Program

## 2022-12-24 ENCOUNTER — Ambulatory Visit
Payer: No Typology Code available for payment source | Attending: Student in an Organized Health Care Education/Training Program | Admitting: Student in an Organized Health Care Education/Training Program

## 2022-12-24 ENCOUNTER — Encounter: Payer: Self-pay | Admitting: Student in an Organized Health Care Education/Training Program

## 2022-12-24 DIAGNOSIS — M25562 Pain in left knee: Secondary | ICD-10-CM

## 2022-12-24 DIAGNOSIS — G8929 Other chronic pain: Secondary | ICD-10-CM | POA: Diagnosis present

## 2022-12-24 DIAGNOSIS — M170 Bilateral primary osteoarthritis of knee: Secondary | ICD-10-CM

## 2022-12-24 DIAGNOSIS — M25561 Pain in right knee: Secondary | ICD-10-CM | POA: Diagnosis present

## 2022-12-24 MED ORDER — LIDOCAINE HCL 2 % IJ SOLN
20.0000 mL | Freq: Once | INTRAMUSCULAR | Status: AC
  Administered 2022-12-24: 400 mg
  Filled 2022-12-24: qty 20

## 2022-12-24 MED ORDER — SODIUM HYALURONATE (VISCOSUP) 20 MG/2ML IX SOSY
2.0000 mL | PREFILLED_SYRINGE | Freq: Once | INTRA_ARTICULAR | Status: AC
  Administered 2022-12-24: 20 mg via INTRA_ARTICULAR

## 2022-12-24 NOTE — Progress Notes (Signed)
PROVIDER NOTE: Interpretation of information contained herein should be left to medically-trained personnel. Specific patient instructions are provided elsewhere under "Patient Instructions" section of medical record. This document was created in part using STT-dictation technology, any transcriptional errors that may result from this process are unintentional.  Patient: Peter Webster Type: Established DOB: 28-Oct-1940 MRN: [id number] PCP: [facility]  Service: Procedure DOS: 12/24/2022 Setting: Ambulatory Location: Ambulatory outpatient facility Delivery: Face-to-face Provider: Edward Jolly, MD Specialty: Interventional Pain Management Specialty designation: 09 Location: Outpatient facility Ref. Prov.: Edward Jolly, MD       Interventional Therapy   Type:  Hyalgan Intra-articular Knee Injection #1  Laterality: Bilateral (-50) Level/approach: Medial Imaging guidance: None required ([id number]) Anesthesia: Local anesthesia (1-2% Lidocaine) DOS: 12/24/2022  Performed by: Edward Jolly, MD  Purpose: Diagnostic/Therapeutic Indications: Knee arthralgia associated to osteoarthritis of the knee 1. Bilateral chronic knee pain   2. Bilateral primary osteoarthritis of knee    NAS-11 score:   Pre-procedure: 8 /10   Post-procedure: 8 /10     Pre-Procedure Preparation  Monitoring: As per clinic protocol.  Risk Assessment: Vitals:  [id number] body mass index is 30.68 kg/m as calculated from the following:   Height as of this encounter: 5\' 11"  (1.803 m).   Weight as of this encounter: 220 lb (99.8 kg)., Rate:84 , BP:(!) 149/93, Resp:17, Temp:(!) 97.2 F (36.2 C), SpO2:100 %  Allergies: He is allergic to lisinopril.  Precautions: No additional precautions required  Blood-thinner(s): None at this time  Coagulopathies: Reviewed. None identified.   Active Infection(s): Reviewed. None identified. Peter Webster is afebrile   Location setting: Exam room Position: Sitting w/ knee bent 90  degrees Safety Precautions: Patient was assessed for positional comfort and pressure points before starting the procedure. Prepping solution: DuraPrep (Iodine Povacrylex [0.7% available iodine] and Isopropyl Alcohol, 74% w/w) Prep Area: Entire knee region Approach: percutaneous, just above the tibial plateau, lateral to the infrapatellar tendon. Intended target: Intra-articular knee space Materials: Tray: Block Needle(s): Regular Qty: 1/side Length: 1.5-inch Gauge: 25G (x1) + 22G (x1)  Meds ordered this encounter  Medications   Sodium Hyaluronate (Viscosup) SOSY 20 mg    Do not substitute. Deliver to facility day before procedure.   Sodium Hyaluronate (Viscosup) SOSY 20 mg    Do not substitute. Deliver to facility day before procedure.   lidocaine (XYLOCAINE) 2 % (with pres) injection 400 mg    No orders of the defined types were placed in this encounter.    Time-out: 1130 I initiated and conducted the "Time-out" before starting the procedure, as per protocol. The patient was asked to participate by confirming the accuracy of the "Time Out" information. Verification of the correct person, site, and procedure were performed and confirmed by me, the nursing staff, and the patient. "Time-out" conducted as per Joint Commission's Universal Protocol (UP.01.01.01). Procedure checklist: Completed  H&P (Pre-op  Assessment)  Peter Webster is a 82 y.o. (year old), male patient, seen today for interventional treatment. He  has a past surgical history that includes Colonoscopy (2019); bilateral hernia (2018); XI robotic assisted ventral hernia (N/A, 12/18/2021); and Insertion of mesh (12/18/2021). Peter Webster has a current medication list which includes the following prescription(s): acetaminophen, acyclovir, amlodipine, calcium carb-cholecalciferol, carbidopa-levodopa, cetirizine, clobetasol ointment, dss, abdominal binder/elastic xl, gabapentin, hydrocortisone, hydrocortisone, ibuprofen, lidocaine,  omeprazole, sildenafil, simvastatin, tizanidine, triamcinolone ointment, and urea. His primarily concern today is the Knee Pain (bilat)  He is allergic to lisinopril.   Last encounter: My last encounter with him was on 11/24/2022.  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 Pain Assessment: Severity of Chronic pain is reported as a 8 /10. Location: Knee Right, Left/sometimes radiates to calves bilat. Onset: More than a month ago. Quality: Aching. Timing: Constant. Modifying factor(s): tylenol. Vitals:  height is 5\' 11"  (1.803 m) and weight is 220 lb (99.8 kg). His temperature is 97.2 F (36.2 C) (abnormal). His blood pressure is 140/104 (abnormal) and his pulse is 81. His respiration is 17 and oxygen saturation is 98%.   Reason for encounter: "interventional pain management therapy due pain of at least four (4) weeks in duration, with failure to respond and/or inability to tolerate more conservative care.  Site Confirmation: Peter Webster was asked to confirm the procedure and laterality before marking the site.  Consent: Before the procedure and under the influence of no sedative(s), amnesic(s), or anxiolytics, the patient was informed of the treatment options, risks and possible complications. To fulfill our ethical and legal obligations, as recommended by the American Medical Association's Code of Ethics, I have informed the patient of my clinical impression; the nature and purpose of the treatment or procedure; the risks, benefits, and possible complications of the intervention; the alternatives, including doing  nothing; the risk(s) and benefit(s) of the alternative treatment(s) or procedure(s); and the risk(s) and benefit(s) of doing nothing. The patient was provided information about the general risks and possible complications associated with the procedure. These may include, but are not limited to: failure to achieve desired goals, infection, bleeding, organ or nerve damage, allergic reactions, paralysis, and death. In addition, the patient was informed of those risks and complications associated to Spine-related procedures, such as failure to decrease pain; infection (i.e.: Meningitis, epidural or intraspinal abscess); bleeding (i.e.: epidural hematoma, subarachnoid hemorrhage, or any other type of intraspinal or peri-dural bleeding); organ or nerve damage (i.e.: Any type of peripheral nerve, nerve root, or spinal cord injury) with subsequent damage to sensory, motor, and/or autonomic systems, resulting in permanent pain, numbness, and/or weakness of one or several areas of the body; allergic reactions; (i.e.: anaphylactic reaction); and/or death. Furthermore, the patient was informed of those risks and complications associated with the medications. These include, but are not limited to: allergic reactions (i.e.: anaphylactic or anaphylactoid reaction(s)); adrenal axis suppression; blood sugar elevation that in diabetics may result in ketoacidosis or comma; water retention that in patients with history of congestive heart failure may result in shortness of breath, pulmonary edema, and decompensation with resultant heart failure; weight gain; swelling or edema; medication-induced neural toxicity; particulate matter embolism and blood vessel occlusion with resultant organ, and/or nervous system infarction; and/or aseptic necrosis of one or more joints. Finally, the patient was informed that Medicine is not an exact science; therefore, there is also the possibility of unforeseen or unpredictable risks and/or possible  complications that may result in a catastrophic outcome. The patient indicated having understood very clearly. We have given the patient no guarantees and we have made no promises. Enough time was given to the patient to ask questions, all of which were answered to the patient's satisfaction. Peter Webster has indicated that he wanted to continue with the procedure. Attestation: I, the ordering provider, attest that I have discussed with the patient  the benefits, risks, side-effects, alternatives, likelihood of achieving goals, and potential problems during recovery for the procedure that I have provided informed consent.  Date  Time: 12/24/2022 11:02 AM  Description of procedure  Start Time: 1131 hrs  Local Anesthesia: Once the patient was positioned, prepped, and time-out was completed. The target area was identified located. The skin was marked with an approved surgical skin marker. Once marked, the skin (epidermis, dermis, and hypodermis), and deeper tissues (fat, connective tissue and muscle) were infiltrated with a small amount of a short-acting local anesthetic, loaded on a 10cc syringe with a 25G, 1.5-in  Needle. An appropriate amount of time was allowed for local anesthetics to take effect before proceeding to the next step. Local Anesthetic: Lidocaine 1-2% The unused portion of the local anesthetic was discarded in the proper designated containers. Safety Precautions: Aspiration looking for blood return was conducted prior to all injections. At no point did I inject any substances, as a needle was being advanced. Before injecting, the patient was told to immediately notify me if he was experiencing any new onset of "ringing in the ears, or metallic taste in the mouth". No attempts were made at seeking any paresthesias. Safe injection practices and needle disposal techniques used. Medications properly checked for expiration dates. SDV (single dose vial) medications used. After the completion of the  procedure, all disposable equipment used was discarded in the proper designated medical waste containers.  Technical description: Protocol guidelines were followed. After positioning, the target area was identified and prepped in the usual manner. Skin & deeper tissues infiltrated with local anesthetic. Appropriate amount of time allowed to pass for local anesthetics to take effect. Proper needle placement secured. Once satisfactory needle placement was confirmed, I proceeded to inject the desired solution in slow, incremental fashion, intermittently assessing for discomfort or any signs of abnormal or undesired spread of substance. Once completed, the needle was removed and disposed of, as per hospital protocols. The area was cleaned, making sure to leave some of the prepping solution back to take advantage of its long term bactericidal properties.  Aspiration:  Negative        Vitals:   12/24/22 1107 12/24/22 1110  BP: (!) 149/93 (!) 140/104  Pulse: 84 81  Resp:  17  Temp: (!) 97.2 F (36.2 C)   SpO2: 100% 98%  Weight: 220 lb (99.8 kg)   Height: 5\' 11"  (1.803 m)     End Time: 1132 hrs  Imaging guidance  Imaging-assisted Technique: None required. Indication(s): N/A Exposure Time: N/A Contrast: None Fluoroscopic Guidance: N/A Ultrasound Guidance: N/A Interpretation: N/A  Post-op assessment  Post-procedure Vital Signs:  Pulse/HCG Rate: 81  Temp: (!) 97.2 F (36.2 C) Resp: 17 BP: (!) 140/104 SpO2: 98 %  EBL: None  Complications: No immediate post-treatment complications observed by team, or reported by patient.  Note: The patient tolerated the entire procedure well. A repeat set of vitals were taken after the procedure and the patient was kept under observation following institutional policy, for this type of procedure. Post-procedural neurological assessment was performed, showing return to baseline, prior to discharge. The patient was provided with post-procedure  discharge instructions, including a section on how to identify potential problems. Should any problems arise concerning this procedure, the patient was given instructions to immediately contact us, at any time, without hesitation. In any case, we plan to contact the patient by telephone for a follow-up status report regarding this interventional procedure.  Comments:  No additional relevant information.  Plan of care    Medications administered: We administered Sodium Hyaluronate (Viscosup), Sodium Hyaluronate (Viscosup), and lidocaine.  Follow-up plan:   Return in about 6 weeks (around 02/04/2023) for Post Procedure Evaluation, in person.      B/L Hyalgan 12/24/22   Recent Visits Date Type Provider Dept  11/24/22 Office Visit Edward Jolly, MD [facility]  10/28/22 Procedure visit Delano Metz, MD [facility]  10/23/22 Office Visit Edward Jolly, MD [facility]  Showing recent visits within past 90 days and meeting all other requirements Today's Visits Date Type Provider Dept  12/24/22 Procedure visit Edward Jolly, MD [facility]  Showing today's visits and meeting all other requirements Future Appointments Date Type Provider Dept  01/26/23 Appointment Edward Jolly, MD [facility]  Showing future appointments within next 90 days and meeting all other requirements   Disposition: Discharge home  Discharge (Date  Time): 12/24/2022; 1139 hrs.   Primary Care Physician: [facility] Note by: Edward Jolly, MD Date: 12/24/2022; Time: 11:44 AM  DISCLAIMER: Medicine is not an exact science. It has no guarantees or warranties. The decision to proceed with this intervention was based on the information collected from the patient. Conclusions were drawn from the patient's questionnaire, interview, and examination. Because information was provided in large part by the patient, it  cannot be guaranteed that it has not been purposely or unconsciously manipulated or altered. Every effort has been made to obtain as much accurate, relevant, available data as possible. Always take into account that the treatment will also be dependent on availability of resources and existing treatment guidelines, considered by other Pain Management Specialists as being common knowledge and practice, at the time of the intervention. It is also important to point out that variation in procedural techniques and pharmacological choices are the acceptable norm. For Medico-Legal review purposes, the indications, contraindications, technique, and results of the these procedures should only be evaluated, judged and interpreted by a Board-Certified Interventional Pain Specialist with extensive familiarity and expertise in the same exact procedure and technique.

## 2022-12-24 NOTE — Progress Notes (Signed)
Safety precautions to be maintained throughout the outpatient stay will include: orient to surroundings, keep bed in low position, maintain call bell within reach at all times, provide assistance with transfer out of bed and ambulation.  

## 2022-12-25 ENCOUNTER — Telehealth: Payer: Self-pay

## 2022-12-25 NOTE — Telephone Encounter (Signed)
Post procedure follow up.  LM 

## 2023-01-26 ENCOUNTER — Ambulatory Visit
Payer: No Typology Code available for payment source | Admitting: Student in an Organized Health Care Education/Training Program

## 2023-02-22 ENCOUNTER — Emergency Department: Payer: No Typology Code available for payment source

## 2023-02-22 ENCOUNTER — Emergency Department
Admission: EM | Admit: 2023-02-22 | Discharge: 2023-02-23 | Disposition: A | Discharge status: Home / Self Care | Payer: No Typology Code available for payment source | Attending: Emergency Medicine | Admitting: Emergency Medicine

## 2023-02-22 ENCOUNTER — Other Ambulatory Visit: Payer: Self-pay

## 2023-02-22 DIAGNOSIS — I10 Essential (primary) hypertension: Secondary | ICD-10-CM | POA: Diagnosis not present

## 2023-02-22 DIAGNOSIS — Y9301 Activity, walking, marching and hiking: Secondary | ICD-10-CM | POA: Insufficient documentation

## 2023-02-22 DIAGNOSIS — S32040A Wedge compression fracture of fourth lumbar vertebra, initial encounter for closed fracture: Secondary | ICD-10-CM | POA: Diagnosis not present

## 2023-02-22 DIAGNOSIS — S22080S Wedge compression fracture of T11-T12 vertebra, sequela: Secondary | ICD-10-CM | POA: Diagnosis not present

## 2023-02-22 DIAGNOSIS — Z8551 Personal history of malignant neoplasm of bladder: Secondary | ICD-10-CM | POA: Insufficient documentation

## 2023-02-22 DIAGNOSIS — Y92009 Unspecified place in unspecified non-institutional (private) residence as the place of occurrence of the external cause: Secondary | ICD-10-CM | POA: Diagnosis not present

## 2023-02-22 DIAGNOSIS — S32030S Wedge compression fracture of third lumbar vertebra, sequela: Secondary | ICD-10-CM | POA: Diagnosis not present

## 2023-02-22 DIAGNOSIS — S3992XA Unspecified injury of lower back, initial encounter: Secondary | ICD-10-CM | POA: Diagnosis present

## 2023-02-22 DIAGNOSIS — W010XXA Fall on same level from slipping, tripping and stumbling without subsequent striking against object, initial encounter: Secondary | ICD-10-CM | POA: Diagnosis not present

## 2023-02-22 DIAGNOSIS — S32040S Wedge compression fracture of fourth lumbar vertebra, sequela: Secondary | ICD-10-CM

## 2023-02-22 DIAGNOSIS — W19XXXA Unspecified fall, initial encounter: Secondary | ICD-10-CM

## 2023-02-22 DIAGNOSIS — S32030A Wedge compression fracture of third lumbar vertebra, initial encounter for closed fracture: Secondary | ICD-10-CM | POA: Diagnosis present

## 2023-02-22 MED ORDER — MORPHINE SULFATE (PF) 4 MG/ML IV SOLN
4.0000 mg | Freq: Once | INTRAVENOUS | Status: AC
  Administered 2023-02-22: 4 mg via INTRAMUSCULAR
  Filled 2023-02-22: qty 1

## 2023-02-22 MED ORDER — HYDROCODONE-ACETAMINOPHEN 5-325 MG PO TABS
1.0000 | ORAL_TABLET | Freq: Once | ORAL | Status: AC
  Administered 2023-02-22: 1 via ORAL
  Filled 2023-02-22: qty 1

## 2023-02-22 MED ORDER — ONDANSETRON 4 MG PO TBDP
4.0000 mg | ORAL_TABLET | Freq: Once | ORAL | Status: AC
  Administered 2023-02-22: 4 mg via ORAL
  Filled 2023-02-22: qty 1

## 2023-02-22 NOTE — ED Triage Notes (Signed)
Pt sts that he has had two falls earlier this week and is now having back pain and left side body pain.

## 2023-02-22 NOTE — ED Notes (Signed)
Called Orth tech for [employer] brace

## 2023-02-22 NOTE — ED Provider Notes (Signed)
[facility] Emergency Department Provider Note     Event Date/Time   First MD Initiated Contact with Patient 02/22/23 1936     (approximate)   History   Fall   HPI  Peter Webster is a 82 y.o. male with a history of HTN, HLD, arthritis, multiple myeloma and bladder cancer presents to the ED with complaint of fall on Wednesday with increasing lower back pain, left rib and left hip pain.  Patient reports tripping while walking at home and landing on his back.  Denies head injury and LOC.  No anticoagulation use.  Denies weakness, numbness, saddle anesthesia.    Physical Exam   Triage Vital Signs: ED Triage Vitals  Encounter Vitals Group     BP 02/22/23 1821 (!) 150/105     Systolic BP Percentile --      Diastolic BP Percentile --      Pulse Rate 02/22/23 1821 99     Resp 02/22/23 1821 19     Temp 02/22/23 1821 98 F (36.7 C)     Temp Source 02/22/23 1821 Oral     SpO2 02/22/23 1821 98 %     Weight 02/22/23 1819 220 lb (99.8 kg)     Height 02/22/23 1819 6\' 2"  (1.88 m)     Head Circumference --      Peak Flow --      Pain Score 02/22/23 1819 10     Pain Loc --      Pain Education --      Exclude from Growth Chart --     Most recent vital signs: Vitals:   02/22/23 1821 02/22/23 2204  BP: (!) 150/105 (!) 149/77  Pulse: 99 98  Resp: 19 18  Temp: 98 F (36.7 C)   SpO2: 98% 95%   General: Alert and oriented. INAD. uncomfortable Skin:  Warm, dry and intact. No rashes or lesions noted.     Head:  NCAT.  Eyes:  PERRLA. EOMI. Conjunctivae clear. Neck:   No cervical spine tenderness to palpation.   CV:  Good peripheral perfusion. RRR. No peripheral edema.  RESP:  Normal effort. LCTAB. No retractions.  ABD:  No distention. Soft, Non tender. No masses or organomegaly.   BACK:  Spinous process is midline without deformity. Positive for midline tenderness over lumbar region. Patient is unable to sit up in bed without assistance due to pain.   NEURO: Cranial nerves intact. No focal deficits. Sensation and motor function intact.   ED Results / Procedures / Treatments   Labs (all labs ordered are listed, but only abnormal results are displayed) Labs Reviewed - No data to display  RADIOLOGY  I personally viewed and evaluated these images as part of my medical decision making, as well as reviewing the written report by the radiologist.  ED Provider Interpretation: CT lumbar spine reveals multiple compressed fractures. Unknown if chronic or acute.   CT Lumbar Spine Wo Contrast  Result Date: 02/22/2023 CLINICAL DATA:  Low back pain, increased fracture risk. Multiple recent falls now with progressive back pain and left abdominal pain. EXAM: CT LUMBAR SPINE WITHOUT CONTRAST TECHNIQUE: Multidetector CT imaging of the lumbar spine was performed without intravenous contrast administration. Multiplanar CT image reconstructions were also generated. RADIATION DOSE REDUCTION: This exam was performed according to the departmental dose-optimization program which includes automated exposure control, adjustment of the mA and/or kV according to patient size and/or use of iterative reconstruction technique. COMPARISON:  CT abdomen pelvis 09/17/2018  FINDINGS: Segmentation: 5 lumbar type vertebrae. Alignment: 3 mm anterolisthesis L3-4 and 4 mm anterolisthesis L4-5, likely degenerative in nature. Otherwise normal lumbar lordosis. Vertebrae: There is an acute coronally oriented fracture involving both the superior and inferior endplates of L4 best seen on sagittal image # 39/6 and axial image # 86/3 with approximately 30-40% loss of height centrally. The posterior wall of the L4 vertebral body is intact and there is no retropulsion. Posterior elements are intact. The osseous structures are diffusely osteopenic. Remote appearing superior endplate fractures of T12 and L3 are noted with 30-40% loss of height centrally. The osseous structures are diffusely  osteopenic. No focal lytic or blastic bone lesions are identified. Paraspinal and other soft tissues: Mild paravertebral infiltration is seen at L4 in keeping with interstitial hemorrhage/edema. No loculated paraspinal fluid collections are identified. Mild atherosclerotic calcification within the visualized abdominal aorta. Multiple simple cortical cysts are seen within the kidneys bilaterally for which no follow-up imaging is recommended. Disc levels: Sagittal reformats demonstrates intervertebral disc space narrowing and endplate remodeling at L5-S1 in keeping with changes of advanced degenerative disc disease. Axial images demonstrate: T12-L1 mild bilateral facet arthrosis. No neuroforaminal narrowing. No canal stenosis. L1-L2: Mild bilateral facet arthrosis. Mild laminar hypertrophy. No significant neuroforaminal narrowing. No canal stenosis. L2-L3: Broad-based disc bulge in combination with osteophytic ridging of the posterosuperior aspect of L3 and moderate bilateral facet arthrosis with associated laminar hypertrophy results in moderate central canal stenosis. No significant neuroforaminal narrowing. L3-4: Broad-based disc bulge. Moderate right and severe left facet arthrosis with associated laminar hypertrophy results in marked central canal stenosis. No facet hypertrophy results in moderate left neuroforaminal narrowing with possible abutment of the exiting left L3 nerve root. There is severe impingement of the lateral recesses bilaterally with possible impingement of the crossing L4 nerve roots bilaterally. L4-5: Advanced bilateral facet arthrosis with associated laminar hypertrophy in Bard based disc bulge results in severe central canal stenosis. No significant neuroforaminal narrowing. Moderate impingement of the lateral recesses. L5-S1: Disc space narrowing, osteophytic ridging, and moderate bilateral facet arthrosis with laminar hypertrophy results in moderate to severe central canal stenosis.  Moderate bilateral neuroforaminal narrowing with possible abutment of the exiting L5 nerve roots bilaterally by osteophytic spurring within the foraminal zones bilaterally. IMPRESSION: 1. Acute coronally oriented fracture involving both the superior and inferior endplates of L4 with approximately 30-40% loss of height centrally. No retropulsion. Posterior wall of the L4 vertebral body is intact and there is no retropulsion. Posterior elements are intact. 2. Remote appearing superior endplate fractures of T12 and L3 with 30-40% loss of height centrally. 3. Multilevel degenerative disc and degenerative joint disease resulting in multilevel central canal and neuroforaminal stenosis as described above. 4. Aortic atherosclerosis. Aortic Atherosclerosis (ICD10-I70.0). Electronically Signed   By: Helyn Numbers M.D.   On: 02/22/2023 20:48   DG Hip Unilat W or Wo Pelvis 2-3 Views Left  Result Date: 02/22/2023 CLINICAL DATA:  Fall, left side pain EXAM: DG HIP (WITH OR WITHOUT PELVIS) 2-3V LEFT COMPARISON:  None Available. FINDINGS: There is no evidence of hip fracture or dislocation. There is no evidence of arthropathy or other focal bone abnormality. IMPRESSION: Negative. Electronically Signed   By: Charlett Nose M.D.   On: 02/22/2023 20:21   DG Ribs Unilateral W/Chest Left  Result Date: 02/22/2023 CLINICAL DATA:  Fall, back pain, left side pain. EXAM: LEFT RIBS AND CHEST - 3+ VIEW COMPARISON:  None Available. FINDINGS: No fracture or other bone lesions are seen involving the  ribs. There is no evidence of pneumothorax or pleural effusion. Both lungs are clear. Heart size and mediastinal contours are within normal limits. IMPRESSION: Negative. Electronically Signed   By: Charlett Nose M.D.   On: 02/22/2023 20:20    PROCEDURES:  Critical Care performed: No  Procedures  MEDICATIONS ORDERED IN ED: Medications  HYDROcodone-acetaminophen (NORCO/VICODIN) 5-325 MG per tablet 1 tablet (1 tablet Oral Given 02/22/23  1938)  ondansetron (ZOFRAN-ODT) disintegrating tablet 4 mg (4 mg Oral Given 02/22/23 1938)  morphine (PF) 4 MG/ML injection 4 mg (4 mg Intramuscular Given 02/22/23 2135)   IMPRESSION / MDM / ASSESSMENT AND PLAN / ED COURSE  I reviewed the triage vital signs and the nursing notes.                              Clinical Course as of 02/23/23 0050  Wynelle Link Feb 22, 2023  2056 Acute L4 fracture 30-40% loss of height  Chronic  [MH]  2102 Neurosurgery consult obtained Dr. Katrinka Blazing coming to see the patient. [MH]    Clinical Course User Index [MH] Conrad Chipley, PA-C    82 y.o. male presents to the emergency department for evaluation and treatment of fall sustaining acute vertebral injury of the lumbar region. See HPI for further details.   Differential diagnosis includes, but is not limited to fracture, dislocation, strain, ligamentous injury.  Patient's presentation is most consistent with acute complicated illness / injury requiring diagnostic workup.  Patient is hemodynamically stable.  Imaging is obtained as stated above.  Please see results.  CT of lumbar spine reveals multiple fractures with acute coronal oriented fracture of the L4 with 30 to 40% loss of height.  Neurosurgery consult obtained.  Dr. Katrinka Blazing evaluated patient in ED.  The recommendation include TLSO brace and outpatient follow-up in the office.  Patient is pending TLSO brace fitting from vendor at this time.  Patient will be discharged home when TLSO brace is placed.Patient is given ED precautions to return to the ED for any worsening or new symptoms. Patient verbalizes understanding. All questions and concerns were addressed during ED visit.     FINAL CLINICAL IMPRESSION(S) / ED DIAGNOSES   Final diagnoses:  Low back pain without sciatica, unspecified back pain laterality, unspecified chronicity  Fall, initial encounter   Rx / DC Orders   ED Discharge Orders     None        Note:  This document was prepared using  Dragon voice recognition software and may include unintentional dictation errors.    Romeo Apple, Ethylene Reznick A, PA-C 02/23/23 0981    Chesley Noon, MD 02/26/23 [phone]

## 2023-02-22 NOTE — Progress Notes (Signed)
Orthopedic Tech Progress Note Patient Details:  Peter Webster 1940/09/14 [id number]  Patient ID: Peter Webster, male   DOB: [birth year], 82 y.o.   MRN: [id number] I called order into [facility] Trinna Post 02/22/2023, 10:20 PM

## 2023-02-22 NOTE — Consult Note (Signed)
Consulting Department:  Emergency department, [place], [place]  Primary Physician:  Administration, [facility]  Chief Complaint: Lumbar compression fracture  History of Present Illness: 02/22/2023 Peter Webster is a 82 y.o. male who presents with the chief complaint of back pain.  Earlier this week he had 2 previous falls.  Had some pain in his back is mostly localized.  No radiation down to his legs.  No new numbness weakness or tingling.  No new difficulty with bowel or bladder.  He has had a previous spinal compression fracture before.  Also carries a diagnosis of osteoporosis.   Review of Systems: Neurologic review of systems reviewed and otherwise negative unless stated above  Past Medical History: Past Medical History:  Diagnosis Date   Acute kidney failure (HCC)    due to chemo   Allergy    Arthritis    Bladder cancer (HCC)    Chronic pain syndrome    DDD (degenerative disc disease), lumbar    ED (erectile dysfunction)    Elevated PSA    GERD (gastroesophageal reflux disease)    Headache    due to sinuses   Hernia of anterior abdominal wall    Hyperlipidemia    Hypertension    Multiple myeloma not having achieved remission (HCC)    Right inguinal hernia    Sleep apnea    uses CPAP   Thrombocytopenia (HCC)    Wedge compression fracture of t11-T12 vertebra, sequela     Past Surgical History: Past Surgical History:  Procedure Laterality Date   bilateral hernia  2018   COLONOSCOPY  2019   INSERTION OF MESH  12/18/2021   Procedure: INSERTION OF MESH;  Surgeon: Campbell Lerner, MD;  Location: [facility] ORS;  Service: General;;   XI ROBOTIC ASSISTED VENTRAL HERNIA N/A 12/18/2021   Procedure: XI ROBOTIC ASSISTED VENTRAL HERNIA, incisional;  Surgeon: Campbell Lerner, MD;  Location: [facility] ORS;  Service: General;  Laterality: N/A;    Allergies: Allergies as of 02/22/2023 - Review Complete 02/22/2023  Allergen Reaction Noted   Lisinopril Cough 08/17/2017     Medications: No current facility-administered medications for this encounter.  Current Outpatient Medications:    lenalidomide (REVLIMID) 5 MG capsule, Take 5 mg by mouth daily. [employer] Auth #      Date Obtained, Disp: , Rfl:    rosuvastatin (CRESTOR) 20 MG tablet, Take 20 mg by mouth daily., Disp: , Rfl:    acetaminophen (TYLENOL) 325 MG tablet, Take 650 mg by mouth every 6 (six) hours as needed., Disp: , Rfl:    acyclovir (ZOVIRAX) 200 MG capsule, Take 200 mg by mouth 2 (two) times daily., Disp: , Rfl:    amLODipine (NORVASC) 5 MG tablet, Take 5 mg by mouth every morning., Disp: , Rfl:    Calcium Carb-Cholecalciferol 500-2.5 MG-MCG CHEW, Chew 2 tablets by mouth daily., Disp: , Rfl:    carbidopa-levodopa (SINEMET IR) 25-100 MG tablet, Take 1 tablet by mouth 3 (three) times daily., Disp: , Rfl:    cetirizine (ZYRTEC) 10 MG tablet, Take 10 mg by mouth every morning., Disp: , Rfl:    clobetasol ointment (TEMOVATE) 0.05 %, 1 application daily as needed (on face and Genitals)., Disp: , Rfl:    Docusate Sodium (DSS) 100 MG CAPS, 1 capsule daily., Disp: , Rfl:    Elastic Bandages & Supports (ABDOMINAL BINDER/ELASTIC XL) MISC, 1 Device by Does not apply route daily., Disp: 1 each, Rfl: 0   gabapentin (NEURONTIN) 600 MG tablet, Take 1 tablet (600 mg  total) by mouth 2 (two) times daily., Disp: 60 tablet, Rfl: 4   hydrocortisone (ANUSOL-HC) 25 MG suppository, INSERT 1 SUPPOSITORY(IES) IN RECTUM EVERY DAY AS NEEDED, Disp: , Rfl:    hydrocortisone 2.5 % ointment, 1 application daily as needed (Scaling)., Disp: , Rfl:    ibuprofen (ADVIL) 800 MG tablet, Take 1 tablet (800 mg total) by mouth every 8 (eight) hours as needed., Disp: 30 tablet, Rfl: 0   lidocaine (XYLOCAINE) 5 % ointment, 1 application  daily as needed., Disp: , Rfl:    omeprazole (PRILOSEC) 20 MG capsule, Take 20 mg by mouth every morning., Disp: , Rfl:    sildenafil (VIAGRA) 100 MG tablet, TAKE ONE-HALF TABLET BY MOUTH AS DIRECTED AS  NEEDED 1 HOUR BEFORE SEXUAL ACTIVITY. DO NOT TAKE WITHIN 6 HOURS OF TERAZOSIN, PRAZOSIN OR DOXAZOSIN. DO NOT TAKE MORE THAN 1 DOSE WITHIN 24 HOURS (REPLACES VARDENAFIL) THIS IS A 90 DAY SUPPLY. WE DO NOT REPLACE., Disp: , Rfl:    simvastatin (ZOCOR) 80 MG tablet, Take 80 mg by mouth daily at 6 PM., Disp: , Rfl:    tiZANidine (ZANAFLEX) 4 MG tablet, Take 4 mg by mouth as needed., Disp: , Rfl:    triamcinolone ointment (KENALOG) 0.1 %, 1 application daily as needed (Dry skin)., Disp: , Rfl:    urea (CARMOL) 20 % cream, 1 Application daily at 6 (six) AM., Disp: , Rfl:    Social History: Social History   Tobacco Use   Smoking status: Never   Smokeless tobacco: Never  Vaping Use   Vaping status: Never Used  Substance Use Topics   Alcohol use: Yes    Alcohol/week: 1.0 standard drink of alcohol    Types: 1 Cans of beer per week    Comment: occ beer   Drug use: No    Family Medical History: Family History  Problem Relation Age of Onset   Stroke Mother    Cervical cancer Mother     Physical Examination: Vitals:   02/22/23 1821 02/22/23 2204  BP: (!) 150/105 (!) 149/77  Pulse: 99 98  Resp: 19 18  Temp: 98 F (36.7 C)   SpO2: 98% 95%     General: Patient is well developed, well nourished, calm, collected, and in no apparent distress.  NEUROLOGICAL:  General: In no acute distress.   Awake, alert, oriented to person, place, and time.  Pupils equal round and reactive to light.  Facial tone is symmetric.  Tongue protrusion is midline.  There is no pronator drift.  Midline tenderness to palpation  Strength:  Side Iliopsoas Quads Hamstring PF DF EHL  R 5 5 5 5 5 5   L 5 5 5 5 5 5     Bilateral upper and lower extremity sensation is intact to light touch. Reflexes are 1+ and symmetric at the patella and achilles.   Clonus is not present.   Imaging: Narrative & Impression  CLINICAL DATA:  Low back pain, increased fracture risk. Multiple recent falls now with progressive  back pain and left abdominal pain.   EXAM: CT LUMBAR SPINE WITHOUT CONTRAST   TECHNIQUE: Multidetector CT imaging of the lumbar spine was performed without intravenous contrast administration. Multiplanar CT image reconstructions were also generated.   RADIATION DOSE REDUCTION: This exam was performed according to the departmental dose-optimization program which includes automated exposure control, adjustment of the mA and/or kV according to patient size and/or use of iterative reconstruction technique.   COMPARISON:  CT abdomen pelvis 09/17/2018   FINDINGS:  Segmentation: 5 lumbar type vertebrae.   Alignment: 3 mm anterolisthesis L3-4 and 4 mm anterolisthesis L4-5, likely degenerative in nature. Otherwise normal lumbar lordosis.   Vertebrae: There is an acute coronally oriented fracture involving both the superior and inferior endplates of L4 best seen on sagittal image # 39/6 and axial image # 86/3 with approximately 30-40% loss of height centrally. The posterior wall of the L4 vertebral body is intact and there is no retropulsion. Posterior elements are intact. The osseous structures are diffusely osteopenic. Remote appearing superior endplate fractures of T12 and L3 are noted with 30-40% loss of height centrally. The osseous structures are diffusely osteopenic. No focal lytic or blastic bone lesions are identified.   Paraspinal and other soft tissues: Mild paravertebral infiltration is seen at L4 in keeping with interstitial hemorrhage/edema. No loculated paraspinal fluid collections are identified. Mild atherosclerotic calcification within the visualized abdominal aorta. Multiple simple cortical cysts are seen within the kidneys bilaterally for which no follow-up imaging is recommended.   Disc levels: Sagittal reformats demonstrates intervertebral disc space narrowing and endplate remodeling at L5-S1 in keeping with changes of advanced degenerative disc disease. Axial  images demonstrate:   T12-L1 mild bilateral facet arthrosis. No neuroforaminal narrowing. No canal stenosis.   L1-L2: Mild bilateral facet arthrosis. Mild laminar hypertrophy. No significant neuroforaminal narrowing. No canal stenosis.   L2-L3: Broad-based disc bulge in combination with osteophytic ridging of the posterosuperior aspect of L3 and moderate bilateral facet arthrosis with associated laminar hypertrophy results in moderate central canal stenosis. No significant neuroforaminal narrowing.   L3-4: Broad-based disc bulge. Moderate right and severe left facet arthrosis with associated laminar hypertrophy results in marked central canal stenosis. No facet hypertrophy results in moderate left neuroforaminal narrowing with possible abutment of the exiting left L3 nerve root. There is severe impingement of the lateral recesses bilaterally with possible impingement of the crossing L4 nerve roots bilaterally.   L4-5: Advanced bilateral facet arthrosis with associated laminar hypertrophy in Bard based disc bulge results in severe central canal stenosis. No significant neuroforaminal narrowing. Moderate impingement of the lateral recesses.   L5-S1: Disc space narrowing, osteophytic ridging, and moderate bilateral facet arthrosis with laminar hypertrophy results in moderate to severe central canal stenosis. Moderate bilateral neuroforaminal narrowing with possible abutment of the exiting L5 nerve roots bilaterally by osteophytic spurring within the foraminal zones bilaterally.   IMPRESSION: 1. Acute coronally oriented fracture involving both the superior and inferior endplates of L4 with approximately 30-40% loss of height centrally. No retropulsion. Posterior wall of the L4 vertebral body is intact and there is no retropulsion. Posterior elements are intact. 2. Remote appearing superior endplate fractures of T12 and L3 with 30-40% loss of height centrally. 3. Multilevel  degenerative disc and degenerative joint disease resulting in multilevel central canal and neuroforaminal stenosis as described above. 4. Aortic atherosclerosis.   Aortic Atherosclerosis (ICD10-I70.0).     Electronically Signed   By: Helyn Numbers M.D.   On: 02/22/2023 20:48      I have personally reviewed the images and agree with the above interpretation.  Labs:    Latest Ref Rng & Units 12/16/2021    2:00 PM  CBC  WBC 4.0 - 10.5 K/uL 5.6   Hemoglobin 13.0 - 17.0 g/dL 25.3   Hematocrit 66.4 - 52.0 % 39.6   Platelets 150 - 400 K/uL 174        Assessment and Plan: Mr. Carlone is a pleasant 82 y.o. male with history of osteoporosis previous  chemotherapy who has had at least 2 prior spinal compression fractures.  He earlier this week he had 2 falls and has had some significant pain in his back.  Came into the emergency department for evaluation.  Not having any neurologic deficits.  No neuro neurologic complaints.  Primary complaint is with pain that is midline without radiation.  Physical exam he is full strength with point tenderness in his back.  On imaging he was found to have a CT scan with a new L4 compression fracture.  Both the T12 and prior L4 compression fracture are noted and appear stable.  There does not appear to be any retropulsion.  No neurologic sequela.  Given these findings we will plan for conservative treatment with a brace and follow-up in neurosurgery clinic with upright x-rays.  This was discussed with the patient.  He would like to go forward with a conservative therapy.  Lovenia Kim, MD/MSCR Dept. of Neurosurgery

## 2023-02-23 NOTE — Progress Notes (Signed)
Orthopedic Tech Progress Note Patient Details:  Peter Webster 02/18/1941 [id number]  Patient ID: Peter Webster, male   DOB: Sep 20, 1940, 82 y.o.   MRN: [id number] After getting another call from [facility] I called [facility] back. Trinna Post 02/23/2023, 1:22 AM

## 2023-02-23 NOTE — Progress Notes (Signed)
Orthopedic Tech Progress Note Patient Details:  Peter Webster 1941-06-13 [id number]  Patient ID: Peter Webster, male   DOB: 1941/07/05, 82 y.o.   MRN: [id number] [facility] called to get an eta for tlso. I called [facility] back. Trinna Post 02/23/2023, 12:10 AM

## 2023-02-23 NOTE — ED Notes (Addendum)
Waiting for ortho to apply TLSO brace.

## 2023-02-23 NOTE — Discharge Instructions (Addendum)
You were evaluated in the ED today for back pain following a fall.  Your CT lumbar spine imaging revealed a fracture of your L4 vertebrae. You spoke with Dr. Katrinka Blazing of neurosurgery who will acute to follow-up in his office sometime next week for further evaluation and management.  You will be fitted for a TLSO brace.  Wear for comfort as needed.

## 2023-02-23 NOTE — ED Notes (Signed)
Patient sitting in chair.  No signs of discomfort or distress.  Okay to discharge.

## 2023-03-02 ENCOUNTER — Ambulatory Visit
Admission: RE | Admit: 2023-03-02 | Discharge: 2023-03-02 | Disposition: A | Discharge status: Home / Self Care | Payer: No Typology Code available for payment source | Source: Ambulatory Visit | Attending: Neurosurgery | Admitting: Neurosurgery

## 2023-03-02 ENCOUNTER — Ambulatory Visit (INDEPENDENT_AMBULATORY_CARE_PROVIDER_SITE_OTHER): Payer: Self-pay | Admitting: Neurosurgery

## 2023-03-02 ENCOUNTER — Encounter: Payer: Self-pay | Admitting: Neurosurgery

## 2023-03-02 ENCOUNTER — Ambulatory Visit
Admission: RE | Admit: 2023-03-02 | Discharge: 2023-03-02 | Disposition: A | Discharge status: Home / Self Care | Payer: No Typology Code available for payment source | Attending: Neurosurgery | Admitting: Neurosurgery

## 2023-03-02 VITALS — BP 126/74 | Ht 71.0 in | Wt 220.0 lb

## 2023-03-02 DIAGNOSIS — S32030A Wedge compression fracture of third lumbar vertebra, initial encounter for closed fracture: Secondary | ICD-10-CM

## 2023-03-02 DIAGNOSIS — W19XXXD Unspecified fall, subsequent encounter: Secondary | ICD-10-CM

## 2023-03-02 DIAGNOSIS — S32040D Wedge compression fracture of fourth lumbar vertebra, subsequent encounter for fracture with routine healing: Secondary | ICD-10-CM

## 2023-03-02 DIAGNOSIS — S32030S Wedge compression fracture of third lumbar vertebra, sequela: Secondary | ICD-10-CM

## 2023-03-02 DIAGNOSIS — S22080S Wedge compression fracture of T11-T12 vertebra, sequela: Secondary | ICD-10-CM

## 2023-03-02 NOTE — Progress Notes (Signed)
Referring Physician:  [facility] [address] [place],  [place] [address]  Primary Physician:  [facility]  History of Present Illness: 03/02/2023 [name] is here today with a chief complaint of lumbar compression fracture.  He has a history of osteoporosis.  He has had prior compression fractures.  He was seen in the emergency department with a new onset of a lumbar compression fracture.  No neurologic compromise.  He has been treated conservatively.  He was given a brace but states that is difficult to tolerate.  He has not had any progressive changes.  He has not had significant relief however it is still quite early.  He has been taking Tylenol with good relief intermittently.  He is also on his gabapentin.  I have utilized the care everywhere function in epic to review the outside records available from external health systems.  Review of Systems:  Neurologic review of systems negative unless otherwise specified  Past Medical History: Past Medical History:  Diagnosis Date   Acute kidney failure (HCC)    due to chemo   Allergy    Arthritis    Bladder cancer (HCC)    Chronic pain syndrome    DDD (degenerative disc disease), lumbar    ED (erectile dysfunction)    Elevated PSA    GERD (gastroesophageal reflux disease)    Headache    due to sinuses   Hernia of anterior abdominal wall    Hyperlipidemia    Hypertension    Multiple myeloma not having achieved remission (HCC)    Right inguinal hernia    Sleep apnea    uses CPAP   Thrombocytopenia (HCC)    Wedge compression fracture of t11-T12 vertebra, sequela     Past Surgical History: Past Surgical History:  Procedure Laterality Date   bilateral hernia  2018   COLONOSCOPY  2019   INSERTION OF MESH  12/18/2021   Procedure: INSERTION OF MESH;  Surgeon: Peter Lerner, MD;  Location: [facility] ORS;  Service: General;;   XI ROBOTIC ASSISTED VENTRAL HERNIA N/A 12/18/2021   Procedure: XI  ROBOTIC ASSISTED VENTRAL HERNIA, incisional;  Surgeon: Peter Lerner, MD;  Location: [facility] OR[facility];  Service: General;  Laterality: N/A;    Allergies: Allergies as of 03/02/2023 - Review Complete 03/02/2023  Allergen Reaction Noted   Lisinopril Cough 08/17/2017    Medications:  Current Outpatient Medications:    acetaminophen (TYLENOL) 325 MG tablet, Take 650 mg by mouth every 6 (six) hours as needed., Disp: , Rfl:    acyclovir (ZOVIRAX) 200 MG capsule, Take 200 mg by mouth 2 (two) times daily., Disp: , Rfl:    amLODipine (NORVASC) 5 MG tablet, Take 5 mg by mouth every morning., Disp: , Rfl:    Calcium Carb-Cholecalciferol 500-2.5 MG-MCG CHEW, Chew 2 tablets by mouth daily., Disp: , Rfl:    carbidopa-levodopa (SINEMET IR) 25-100 MG tablet, Take 1 tablet by mouth 3 (three) times daily., Disp: , Rfl:    cetirizine (ZYRTEC) 10 MG tablet, Take 10 mg by mouth every morning., Disp: , Rfl:    clobetasol ointment (TEMOVATE) 0.05 %, 1 application daily as needed (on face and Genitals)., Disp: , Rfl:    Docusate Sodium (DSS) 100 MG CAPS, 1 capsule daily., Disp: , Rfl:    Elastic Bandages & Supports (ABDOMINAL BINDER/ELASTIC XL) MISC, 1 Device by Does not apply route daily., Disp: 1 each, Rfl: 0   hydrocortisone (ANUSOL-HC) 25 MG suppository, INSERT 1 SUPPOSITORY(IES) IN RECTUM EVERY DAY AS  NEEDED, Disp: , Rfl:    hydrocortisone 2.5 % ointment, 1 application daily as needed (Scaling)., Disp: , Rfl:    ibuprofen (ADVIL) 800 MG tablet, Take 1 tablet (800 mg total) by mouth every 8 (eight) hours as needed., Disp: 30 tablet, Rfl: 0   lenalidomide (REVLIMID) 5 MG capsule, Take 5 mg by mouth daily. [employer] Auth #      Date Obtained, Disp: , Rfl:    lidocaine (XYLOCAINE) 5 % ointment, 1 application  daily as needed., Disp: , Rfl:    omeprazole (PRILOSEC) 20 MG capsule, Take 20 mg by mouth every morning., Disp: , Rfl:    rosuvastatin (CRESTOR) 20 MG tablet, Take 20 mg by mouth daily., Disp: , Rfl:     sildenafil (VIAGRA) 100 MG tablet, TAKE ONE-HALF TABLET BY MOUTH AS DIRECTED AS NEEDED 1 HOUR BEFORE SEXUAL ACTIVITY. DO NOT TAKE WITHIN 6 HOURS OF TERAZOSIN, PRAZOSIN OR DOXAZOSIN. DO NOT TAKE MORE THAN 1 DOSE WITHIN 24 HOURS (REPLACES VARDENAFIL) THIS IS A 90 DAY SUPPLY. WE DO NOT REPLACE., Disp: , Rfl:    simvastatin (ZOCOR) 80 MG tablet, Take 80 mg by mouth daily at 6 PM., Disp: , Rfl:    tiZANidine (ZANAFLEX) 4 MG tablet, Take 4 mg by mouth as needed., Disp: , Rfl:    triamcinolone ointment (KENALOG) 0.1 %, 1 application daily as needed (Dry skin)., Disp: , Rfl:    urea (CARMOL) 20 % cream, 1 Application daily at 6 (six) AM., Disp: , Rfl:    gabapentin (NEURONTIN) 600 MG tablet, Take 1 tablet (600 mg total) by mouth 2 (two) times daily., Disp: 60 tablet, Rfl: 4  Social History: Social History   Tobacco Use   Smoking status: Never   Smokeless tobacco: Never  Vaping Use   Vaping status: Never Used  Substance Use Topics   Alcohol use: Yes    Alcohol/week: 1.0 standard drink of alcohol    Types: 1 Cans of beer per week    Comment: occ beer   Drug use: No    Family Medical History: Family History  Problem Relation Age of Onset   Stroke Mother    Cervical cancer Mother     Physical Examination: Vitals:   03/02/23 1146  BP: 126/74    General: Patient is in no apparent distress. Attention to examination is appropriate.  Neck:   Supple.  Full range of motion.  Respiratory: Patient is breathing without any difficulty.  Back: Some point tenderness noted to palpation  NEUROLOGICAL:     Awake, alert, oriented to person, place, and time.  Speech is clear and fluent.   Cranial Nerves: Pupils equal round and reactive to light.  Facial tone is symmetric.  Facial sensation is symmetric. Shoulder shrug is symmetric. Tongue protrusion is midline.    Strength:  Side Iliopsoas Quads Hamstring PF DF EHL  R 5 5 5 5 5 5   L 5 5 5 5 5 5    Reflexes are 2+ and symmetric at patella and  achilles.   Hoffman's is absent. Clonus is absent  Bilateral upper and lower extremity sensation is intact to light touch     No evidence of dysmetria noted.  Gait is 4 wheeled walker assisted  Imaging: No new imaging to review at this point.  We did review his prior CT scan which was discussed with him in detail.  Shows osteoporosis with multiple compression fractures.  The L4 body fracture appeared to be the most acute.  Narrative &  Impression  CLINICAL DATA:  Low back pain, increased fracture risk. Multiple recent falls now with progressive back pain and left abdominal pain.   EXAM: CT LUMBAR SPINE WITHOUT CONTRAST   TECHNIQUE: Multidetector CT imaging of the lumbar spine was performed without intravenous contrast administration. Multiplanar CT image reconstructions were also generated.   RADIATION DOSE REDUCTION: This exam was performed according to the departmental dose-optimization program which includes automated exposure control, adjustment of the mA and/or kV according to patient size and/or use of iterative reconstruction technique.   COMPARISON:  CT abdomen pelvis 09/17/2018   FINDINGS: Segmentation: 5 lumbar type vertebrae.   Alignment: 3 mm anterolisthesis L3-4 and 4 mm anterolisthesis L4-5, likely degenerative in nature. Otherwise normal lumbar lordosis.   Vertebrae: There is an acute coronally oriented fracture involving both the superior and inferior endplates of L4 best seen on sagittal image # 39/6 and axial image # 86/3 with approximately 30-40% loss of height centrally. The posterior wall of the L4 vertebral body is intact and there is no retropulsion. Posterior elements are intact. The osseous structures are diffusely osteopenic. Remote appearing superior endplate fractures of T12 and L3 are noted with 30-40% loss of height centrally. The osseous structures are diffusely osteopenic. No focal lytic or blastic bone lesions are identified.   Paraspinal  and other soft tissues: Mild paravertebral infiltration is seen at L4 in keeping with interstitial hemorrhage/edema. No loculated paraspinal fluid collections are identified. Mild atherosclerotic calcification within the visualized abdominal aorta. Multiple simple cortical cysts are seen within the kidneys bilaterally for which no follow-up imaging is recommended.   Disc levels: Sagittal reformats demonstrates intervertebral disc space narrowing and endplate remodeling at L5-S1 in keeping with changes of advanced degenerative disc disease. Axial images demonstrate:   T12-L1 mild bilateral facet arthrosis. No neuroforaminal narrowing. No canal stenosis.   L1-L2: Mild bilateral facet arthrosis. Mild laminar hypertrophy. No significant neuroforaminal narrowing. No canal stenosis.   L2-L3: Broad-based disc bulge in combination with osteophytic ridging of the posterosuperior aspect of L3 and moderate bilateral facet arthrosis with associated laminar hypertrophy results in moderate central canal stenosis. No significant neuroforaminal narrowing.   L3-4: Broad-based disc bulge. Moderate right and severe left facet arthrosis with associated laminar hypertrophy results in marked central canal stenosis. No facet hypertrophy results in moderate left neuroforaminal narrowing with possible abutment of the exiting left L3 nerve root. There is severe impingement of the lateral recesses bilaterally with possible impingement of the crossing L4 nerve roots bilaterally.   L4-5: Advanced bilateral facet arthrosis with associated laminar hypertrophy in Bard based disc bulge results in severe central canal stenosis. No significant neuroforaminal narrowing. Moderate impingement of the lateral recesses.   L5-S1: Disc space narrowing, osteophytic ridging, and moderate bilateral facet arthrosis with laminar hypertrophy results in moderate to severe central canal stenosis. Moderate  bilateral neuroforaminal narrowing with possible abutment of the exiting L5 nerve roots bilaterally by osteophytic spurring within the foraminal zones bilaterally.   IMPRESSION: 1. Acute coronally oriented fracture involving both the superior and inferior endplates of L4 with approximately 30-40% loss of height centrally. No retropulsion. Posterior wall of the L4 vertebral body is intact and there is no retropulsion. Posterior elements are intact. 2. Remote appearing superior endplate fractures of T12 and L3 with 30-40% loss of height centrally. 3. Multilevel degenerative disc and degenerative joint disease resulting in multilevel central canal and neuroforaminal stenosis as described above. 4. Aortic atherosclerosis.   Aortic Atherosclerosis (ICD10-I70.0).     Electronically Signed  By: Helyn Numbers M.D.   On: 02/22/2023 20:48    I have personally reviewed the images and agree with the above interpretation.  Medical Decision Making/Assessment and Plan: [name] is a pleasant 82 y.o. male with history of severe osteoporosis.  He has previous sleep seen in the emergency department for a lumbar compression fracture.  He is not having any neurologic complaints.  Continues to have back pain.  He is currently tolerating it with acetaminophen and gabapentin.  We would like to get repeat x-rays to evaluate for any changes in the upright position.  His tolerance for the brace was low, he stated that it was uncomfortable on making it difficult to breathe.  He would like to have [place] reach out to his pain physician to ask whether or not there are any interventions available for him other than kyphoplasty.  Will plan on sending him a direct message today.    Thank you for involving me in the care of this patient.    Lovenia Kim MD/MSCR Neurosurgery

## 2023-03-04 ENCOUNTER — Telehealth: Payer: Self-pay | Admitting: Neurosurgery

## 2023-03-04 NOTE — Telephone Encounter (Signed)
Dr Michaelle Copas note on 03/02/23 states: "He would like to have [place] reach out to his pain physician to ask whether or not there are any interventions available for him other than kyphoplasty. Will plan on sending him a direct message today. "

## 2023-03-04 NOTE — Telephone Encounter (Signed)
Patient is calling if Dr.Smith had a chance to discuss with his colleague what medication he should take to treat his back pain?

## 2023-03-12 NOTE — Telephone Encounter (Signed)
Patient notified of Dr.Smiths response.

## 2023-03-17 ENCOUNTER — Encounter: Payer: Self-pay | Admitting: Student in an Organized Health Care Education/Training Program

## 2023-03-17 ENCOUNTER — Ambulatory Visit
Payer: No Typology Code available for payment source | Attending: Student in an Organized Health Care Education/Training Program | Admitting: Student in an Organized Health Care Education/Training Program

## 2023-03-17 VITALS — BP 130/81 | HR 91 | Temp 97.2°F | Resp 16 | Ht 71.0 in | Wt 222.0 lb

## 2023-03-17 DIAGNOSIS — M170 Bilateral primary osteoarthritis of knee: Secondary | ICD-10-CM | POA: Diagnosis not present

## 2023-03-17 DIAGNOSIS — M25562 Pain in left knee: Secondary | ICD-10-CM | POA: Diagnosis present

## 2023-03-17 DIAGNOSIS — G894 Chronic pain syndrome: Secondary | ICD-10-CM | POA: Diagnosis not present

## 2023-03-17 DIAGNOSIS — M47816 Spondylosis without myelopathy or radiculopathy, lumbar region: Secondary | ICD-10-CM

## 2023-03-17 DIAGNOSIS — M5416 Radiculopathy, lumbar region: Secondary | ICD-10-CM | POA: Diagnosis present

## 2023-03-17 DIAGNOSIS — M25561 Pain in right knee: Secondary | ICD-10-CM | POA: Diagnosis present

## 2023-03-17 DIAGNOSIS — G8929 Other chronic pain: Secondary | ICD-10-CM | POA: Diagnosis present

## 2023-03-17 MED ORDER — TRAMADOL HCL 50 MG PO TABS: 50.0000 mg | ORAL_TABLET | Freq: Three times a day (TID) | ORAL | 0 refills | Status: DC | PRN

## 2023-03-17 MED ORDER — TRAMADOL HCL 50 MG PO TABS: 50.0000 mg | ORAL_TABLET | Freq: Three times a day (TID) | ORAL | 0 refills | Status: AC | PRN

## 2023-03-17 NOTE — Progress Notes (Signed)
Safety precautions to be maintained throughout the outpatient stay will include: orient to surroundings, keep bed in low position, maintain call bell within reach at all times, provide assistance with transfer out of bed and ambulation.  

## 2023-03-17 NOTE — Progress Notes (Signed)
PROVIDER NOTE: Information contained herein reflects review and annotations entered in association with encounter. Interpretation of such information and data should be left to medically-trained personnel. Information provided to patient can be located elsewhere in the medical record under "Patient Instructions". Document created using STT-dictation technology, any transcriptional errors that may result from process are unintentional.    Patient: Peter Webster  Service Category: E/M  Provider: Edward Jolly, MD  DOB: [birth year]  DOS: 03/17/2023  Referring Provider: [facility]  MRN: [id number]  Specialty: Interventional Pain Management  PCP: [facility]  Type: Established Patient  Setting: Ambulatory outpatient    Location: Office  Delivery: Face-to-face     HPI  Mr. Shazil Hilaire, a 82 y.o. year old male, is here today because of his Bilateral chronic knee pain [M25.561, M25.562, G89.29]. Mr. Buenafe primary complain today is Knee Pain (Right ) and Back Pain (Lumbar left side is worse.  Fx L4 from fall, evaluated by Dr Katrinka Blazing )  Pertinent problems: Mr. Mcbeth has Multiple myeloma not having achieved remission (HCC); Thrombocytopenia (HCC); Wedge compression fracture of t11-T12 vertebra, sequela; Chronic bilateral low back pain with left-sided sciatica; Lumbar degenerative disc disease; Lumbar spondylosis; Chronic radicular lumbar pain; Chronic pain syndrome; Compression fracture of thoracic vertebra (HCC); and Thoracic spondylosis on their pertinent problem list. Pain Assessment: Severity of Chronic pain is reported as a 8  (back = 9)/10. Location:  (left back) Right/denies. Onset: More than a month ago. Quality: Throbbing, Discomfort, Constant, Sharp. Timing: Constant. Modifying factor(s): when sitting will use heat and vibration belt to the back.. Vitals:  height is 5\' 11"  (1.803 m) and weight is 222 lb (100.7 kg). His temporal temperature is 97.2 F (36.2 C) (abnormal). His  blood pressure is 130/81 and his pulse is 91. His respiration is 16 and oxygen saturation is 100%.  BMI: Estimated body mass index is 30.96 kg/m as calculated from the following:   Height as of this encounter: 5\' 11"  (1.803 m).   Weight as of this encounter: 222 lb (100.7 kg). Last encounter: 11/24/2022. Last procedure: 12/24/2022.  Reason for encounter: patient-requested evaluation.   Patient unfortunately sustained a fall February 22, 2023 resulting in a closed compression fracture of the L4 vertebra.  He has a history of chronic thoracic compression fractures.  He is endorsing increased lower lumbar spine pain as well as pain that radiates into his left back.  He has been evaluated by Dr. Katrinka Blazing with neurosurgery and was recommended to follow-up with me to discuss options to help manage his low back pain other than kyphoplasty.  We briefly discussed kyphoplasty.  We also reviewed his recent CT of his lumbar spine.  He does have advanced/severe spinal canal stenosis, facet arthropathy along with nerve root compression.  We discussed lumbar facet medial branch nerve blocks as well as lumbar epidural steroid injection.  He is endorsing increased right knee pain as well.  He had knee Hyalgan injections done over 2 months ago and patient would like to repeat his right 1.  He is requesting something stronger than acetaminophen to help manage his acute on chronic pain from his lumbar compression fracture.  Has not tried tramadol in the past.  Pharmacotherapy Assessment  Analgesic: Tramadol 50 mg every 8 hours as needed for acute/breakthrough pain from lumbar compression fracture.  Monitoring: [place] PMP: PDMP not reviewed this encounter.       Pharmacotherapy: No side-effects or adverse reactions reported. Compliance: No problems identified. Effectiveness: Clinically acceptable.  Vernie Ammons, RN  03/17/2023  3:34 PM  Sign when Signing Visit Safety precautions to be maintained throughout the  outpatient stay will include: orient to surroundings, keep bed in low position, maintain call bell within reach at all times, provide assistance with transfer out of bed and ambulation.   No results found for: "CBDTHCR" No results found for: "D8THCCBX" No results found for: "D9THCCBX"  UDS:  Summary  Date Value Ref Range Status  11/20/2021 Note  Final    Comment:    ==================================================================== Compliance Drug Analysis, Ur ==================================================================== Test                             Result       Flag       Units  Drug Present and Declared for Prescription Verification   Acetaminophen                  PRESENT      EXPECTED  Drug Absent but Declared for Prescription Verification   Gabapentin                     Not Detected UNEXPECTED   Tizanidine                     Not Detected UNEXPECTED    Tizanidine, as indicated in the declared medication list, is not    always detected even when used as directed.    Prochlorperazine               Not Detected UNEXPECTED   Lidocaine                      Not Detected UNEXPECTED    Lidocaine, as indicated in the declared medication list, is not    always detected even when used as directed.  ==================================================================== Test                      Result    Flag   Units      Ref Range   Creatinine              168              mg/dL      >=40 ==================================================================== Declared Medications:  The flagging and interpretation on this report are based on the  following declared medications.  Unexpected results may arise from  inaccuracies in the declared medications.   **Note: The testing scope of this panel includes these medications:   Gabapentin (Neurontin)  Prochlorperazine (Compazine)   **Note: The testing scope of this panel does not include small to  moderate amounts of these  reported medications:   Acetaminophen (Tylenol)  Lidocaine (Xylocaine)  Tizanidine   **Note: The testing scope of this panel does not include the  following reported medications:   Acyclovir  Amlodipine (Norvasc)  Calcium  Capsaicin  Cetirizine (Zyrtec)  Cholecalciferol  Clobetasol  Cyanocobalamin  Docusate  Hydrocortisone  Omeprazole  Sildenafil (Viagra)  Simvastatin (Zocor)  Topical  Triamcinolone (Kenalog) ==================================================================== For clinical consultation, please call [phone]. ====================================================================       ROS  Constitutional: Denies any fever or chills Gastrointestinal: No reported hemesis, hematochezia, vomiting, or acute GI distress Musculoskeletal:  Right knee pain, low back pain with radiation into right leg Neurological: No reported episodes of acute onset apraxia, aphasia, dysarthria, agnosia, amnesia, paralysis, loss of coordination, or loss of consciousness  Medication Review  Abdominal Binder/Elastic XL, Calcium Carb-Cholecalciferol, DSS, FLUoxetine, acetaminophen, acyclovir, amLODipine, carbidopa-levodopa, cetirizine, clobetasol ointment, gabapentin, hydrocortisone, ibuprofen, lenalidomide, lidocaine, omeprazole, rosuvastatin, sildenafil, simvastatin, tiZANidine, traMADol, triamcinolone ointment, and urea  History Review  Allergy: Mr. Harsh is allergic to lisinopril. Drug: [name]  reports no history of drug use. Alcohol:  reports current alcohol use of about 1.0 standard drink of alcohol per week. Tobacco:  reports that he has never smoked. He has never used smokeless tobacco. Social: Mr. Stfleur  reports that he has never smoked. He has never used smokeless tobacco. He reports current alcohol use of about 1.0 standard drink of alcohol per week. He reports that he does not use drugs. Medical:  has a past medical history of Acute kidney failure (HCC),  Allergy, Arthritis, Bladder cancer (HCC), Chronic pain syndrome, DDD (degenerative disc disease), lumbar, ED (erectile dysfunction), Elevated PSA, GERD (gastroesophageal reflux disease), Headache, Hernia of anterior abdominal wall, Hyperlipidemia, Hypertension, Multiple myeloma not having achieved remission (HCC), Right inguinal hernia, Sleep apnea, Thrombocytopenia (HCC), and Wedge compression fracture of t11-T12 vertebra, sequela. Surgical: Mr. Vanfossen  has a past surgical history that includes Colonoscopy (2019); bilateral hernia (2018); XI robotic assisted ventral hernia (N/A, 12/18/2021); and Insertion of mesh (12/18/2021). Family: family history includes Cervical cancer in his mother; Stroke in his mother.  Laboratory Chemistry Profile   Renal Lab Results  Component Value Date   BUN 22 11/20/2021   CREATININE 1.96 (H) 11/20/2021   BCR 11 11/20/2021    Hepatic Lab Results  Component Value Date   AST 18 11/20/2021   ALBUMIN 4.6 11/20/2021   ALKPHOS 82 11/20/2021    Electrolytes Lab Results  Component Value Date   NA 142 11/20/2021   K 3.7 11/20/2021   CL 104 11/20/2021   CALCIUM 10.1 11/20/2021   MG 2.0 11/20/2021    Bone Lab Results  Component Value Date   25OHVITD1 38 11/20/2021   25OHVITD2 1.0 11/20/2021   25OHVITD3 37 11/20/2021    Inflammation (CRP: Acute Phase) (ESR: Chronic Phase) No results found for: "CRP", "ESRSEDRATE", "LATICACIDVEN"       Note: Above Lab results reviewed.  Recent Imaging Review  DG Lumbar Spine 2-3 Views CLINICAL DATA:  Pain 3 weeks after fall  EXAM: LUMBAR SPINE - 2 VIEW  COMPARISON:  11/20/2021  FINDINGS: Osseous structures are osteopenic. Stable central compression deformity at the L3 level. New acute compression fracture of L4. Disc space narrowing L5-S1 is also stable finding. Stable mild compression deformity at the T12 level.  IMPRESSION: 1. Chronic compression deformities of T12 and L3. 2. New compression deformity L4. 3.  Lumbosacral degenerative changes.  Electronically Signed   By: Layla Maw M.D.   On: 03/07/2023 16:51 Note: Reviewed        Physical Exam  General appearance: Well nourished, well developed, and well hydrated. In no apparent acute distress Mental status: Alert, oriented x 3 (person, place, & time)       Respiratory: No evidence of acute respiratory distress Eyes: PERLA Vitals: BP 130/81 (BP Location: Right Arm, Patient Position: Sitting, Cuff Size: Large)   Pulse 91   Temp (!) 97.2 F (36.2 C) (Temporal)   Resp 16   Ht 5\' 11"  (1.803 m)   Wt 222 lb (100.7 kg)   SpO2 100%   BMI 30.96 kg/m  BMI: Estimated body mass index is 30.96 kg/m as calculated from the following:   Height as of this encounter: 5\' 11"  (1.803 m).   Weight  as of this encounter: 222 lb (100.7 kg). Ideal: Ideal body weight: 75.3 kg (166 lb 0.1 oz) Adjusted ideal body weight: 85.5 kg (188 lb 6.5 oz)  Thoracic Spine Area Exam  Skin & Axial Inspection: No masses, redness, or swelling Alignment: Symmetrical Functional ROM: Unrestricted ROM Stability: No instability detected Muscle Tone/Strength: Functionally intact. No obvious neuro-muscular anomalies detected. Sensory (Neurological): Unimpaired Muscle strength & Tone: No palpable anomalies Lumbar Spine Area Exam  Skin & Axial Inspection: No masses, redness, or swelling Alignment: Symmetrical Functional ROM: Pain restricted ROM       Stability: No instability detected Muscle Tone/Strength: Functionally intact. No obvious neuro-muscular anomalies detected. Sensory (Neurological): Musculoskeletal pain pattern Palpation: Complains of area being tender to palpation        Lower Extremity Exam    Side: Right lower extremity  Side: Left lower extremity  Stability: No instability observed          Stability: No instability observed          Skin & Extremity Inspection: Skin color, temperature, and hair growth are WNL. No peripheral edema or cyanosis. No  masses, redness, swelling, asymmetry, or associated skin lesions. No contractures.  Skin & Extremity Inspection: Skin color, temperature, and hair growth are WNL. No peripheral edema or cyanosis. No masses, redness, swelling, asymmetry, or associated skin lesions. No contractures.  Functional ROM: Pain restricted ROM for knee joint          Functional ROM: Unrestricted ROM                  Muscle Tone/Strength: Functionally intact. No obvious neuro-muscular anomalies detected.  Muscle Tone/Strength: Functionally intact. No obvious neuro-muscular anomalies detected.  Sensory (Neurological): Unimpaired        Sensory (Neurological): Unimpaired        DTR: Patellar: deferred today Achilles: deferred today Plantar: deferred today  DTR: Patellar: deferred today Achilles: deferred today Plantar: deferred today  Palpation: No palpable anomalies  Palpation: No palpable anomalies    Assessment   Diagnosis Status  1. Bilateral chronic knee pain   2. Bilateral primary osteoarthritis of knee   3. Lumbar facet arthropathy   4. Chronic pain syndrome   5. Chronic radicular lumbar pain    Having a Flare-up Having a Flare-up Having a Flare-up    Plan of Care    Mr. Demaje Cuartas has a current medication list which includes the following long-term medication(s): amlodipine, calcium carb-cholecalciferol, carbidopa-levodopa, cetirizine, fluoxetine, gabapentin, omeprazole, rosuvastatin, sildenafil, and simvastatin.  Pharmacotherapy (Medications Ordered): Meds ordered this encounter  Medications   DISCONTD: traMADol (ULTRAM) 50 MG tablet    Sig: Take 1 tablet (50 mg total) by mouth every 8 (eight) hours as needed for severe pain.    Dispense:  90 tablet    Refill:  0    Fill one day early if pharmacy is closed on scheduled refill date.   traMADol (ULTRAM) 50 MG tablet    Sig: Take 1 tablet (50 mg total) by mouth every 8 (eight) hours as needed for severe pain.    Dispense:  90 tablet     Refill:  0    Fill one day early if pharmacy is closed on scheduled refill date.   Orders:  Orders Placed This Encounter  Procedures   Lumbar Epidural Injection    Standing Status:   Future    Standing Expiration Date:   06/16/2023    Scheduling Instructions:     Procedure: Interlaminar Lumbar Epidural Steroid  injection (LESI)            Laterality: Midline     Sedation: PO Valium     Timeframe: ASAA    Order Specific Question:   Where will this procedure be performed?    Answer:   [facility]   LUMBAR FACET(MEDIAL BRANCH NERVE BLOCK) MBNB    Standing Status:   Future    Standing Expiration Date:   06/16/2023    Scheduling Instructions:     Procedure: Lumbar facet block (AKA.: Lumbosacral medial branch nerve block)     Side: Bilateral     Level: L3-4, L4-5, Facets (, L3, L4, L5, Medial Branch)     Sedation: PO Valium     Timeframe: ASAA    Order Specific Question:   Where will this procedure be performed?    Answer:   [facility]   KNEE INJECTION    Indications: Knee arthralgia (pain) due to osteoarthritis (OA) Imaging: None (CPT-20610) Position: Sitting Equipment/Materials: Block tray  1.5", 25-G (one per side)  Local anesthetic  Hyalgan (one per side)    Scheduling Instructions:     Procedure: Knee injection Hyalgan (Hyaluronan/Hyaluronic acid)     Treatment No.:   2         Level: Intra-articular     Laterality: RIGHT     Sedation: Patient's choice.    Order Specific Question:   Where will this procedure be performed?    Answer:   [facility]   Follow-up plan:   Return in about 15 days (around 04/01/2023) for L3/4 ESI, BLF L3-5, right knee hyalgan (block 40 mins), in clinic (PO Valium).      B/L Hyalgan 12/24/22     Recent Visits Date Type Provider Dept  12/24/22 Procedure visit Edward Jolly, MD [facility]  Showing recent visits within past 90 days and meeting all other requirements Today's Visits Date Type Provider Dept   03/17/23 Office Visit Edward Jolly, MD [facility]  Showing today's visits and meeting all other requirements Future Appointments No visits were found meeting these conditions. Showing future appointments within next 90 days and meeting all other requirements  I discussed the assessment and treatment plan with the patient. The patient was provided an opportunity to ask questions and all were answered. The patient agreed with the plan and demonstrated an understanding of the instructions.  Patient advised to call back or seek an in-person evaluation if the symptoms or condition worsens.  Duration of encounter: .  Total time on encounter, as per AMA guidelines included both the face-to-face and non-face-to-face time personally spent by the physician and/or other qualified health care professional(s) on the day of the encounter (includes time in activities that require the physician or other qualified health care professional and does not include time in activities normally performed by clinical staff). Physician's time may include the following activities when performed: Preparing to see the patient (e.g., pre-charting review of records, searching for previously ordered imaging, lab work, and nerve conduction tests) Review of prior analgesic pharmacotherapies. Reviewing PMP Interpreting ordered tests (e.g., lab work, imaging, nerve conduction tests) Performing post-procedure evaluations, including interpretation of diagnostic procedures Obtaining and/or reviewing separately obtained history Performing a medically appropriate examination and/or evaluation Counseling and educating the patient/family/caregiver Ordering medications, tests, or procedures Referring and communicating with other health care professionals (when not separately reported) Documenting clinical information in the electronic or other health record Independently interpreting results (not separately  reported) and  communicating results to the patient/ family/caregiver Care coordination (not separately reported)  Note by: Edward Jolly, MD Date: 03/17/2023; Time: 3:51 PM

## 2023-03-17 NOTE — Patient Instructions (Signed)
______________________________________________________________________    General Risks and Possible Complications  Patient Responsibilities: It is important that you read this as it is part of your informed consent. It is our duty to inform you of the risks and possible complications associated with treatments offered to you. It is your responsibility as a patient to read this and to ask questions about anything that is not clear or that you believe was not covered in this document.  Patient's Rights: You have the right to refuse treatment. You also have the right to change your mind, even after initially having agreed to have the treatment done. However, under this last option, if you wait until the last second to change your mind, you may be charged for the materials used up to that point.  Introduction: Medicine is not an Visual merchandiser. Everything in Medicine, including the lack of treatment(s), carries the potential for danger, harm, or loss (which is by definition: Risk). In Medicine, a complication is a secondary problem, condition, or disease that can aggravate an already existing one. All treatments carry the risk of possible complications. The fact that a side effects or complications occurs, does not imply that the treatment was conducted incorrectly. It must be clearly understood that these can happen even when everything is done following the highest safety standards.  No treatment: You can choose not to proceed with the proposed treatment alternative. The "PRO(s)" would include: avoiding the risk of complications associated with the therapy. The "CON(s)" would include: not getting any of the treatment benefits. These benefits fall under one of three categories: diagnostic; therapeutic; and/or palliative. Diagnostic benefits include: getting information which can ultimately lead to improvement of the disease or symptom(s). Therapeutic benefits are those associated with the successful  treatment of the disease. Finally, palliative benefits are those related to the decrease of the primary symptoms, without necessarily curing the condition (example: decreasing the pain from a flare-up of a chronic condition, such as incurable terminal cancer).  General Risks and Complications: These are associated to most interventional treatments. They can occur alone, or in combination. They fall under one of the following six (6) categories: no benefit or worsening of symptoms; bleeding; infection; nerve damage; allergic reactions; and/or death. No benefits or worsening of symptoms: In Medicine there are no guarantees, only probabilities. No healthcare provider can ever guarantee that a medical treatment will work, they can only state the probability that it may. Furthermore, there is always the possibility that the condition may worsen, either directly, or indirectly, as a consequence of the treatment. Bleeding: This is more common if the patient is taking a blood thinner, either prescription or over the counter (example: Goody Powders, Fish oil, Aspirin, Garlic, etc.), or if suffering a condition associated with impaired coagulation (example: Hemophilia, cirrhosis of the liver, low platelet counts, etc.). However, even if you do not have one on these, it can still happen. If you have any of these conditions, or take one of these drugs, make sure to notify your treating physician. Infection: This is more common in patients with a compromised immune system, either due to disease (example: diabetes, cancer, human immunodeficiency virus [HIV], etc.), or due to medications or treatments (example: therapies used to treat cancer and rheumatological diseases). However, even if you do not have one on these, it can still happen. If you have any of these conditions, or take one of these drugs, make sure to notify your treating physician. Nerve Damage: This is more common when the treatment is  an invasive one, but it  can also happen with the use of medications, such as those used in the treatment of cancer. The damage can occur to small secondary nerves, or to large primary ones, such as those in the spinal cord and brain. This damage may be temporary or permanent and it may lead to impairments that can range from temporary numbness to permanent paralysis and/or brain death. Allergic Reactions: Any time a substance or material comes in contact with our body, there is the possibility of an allergic reaction. These can range from a mild skin rash (contact dermatitis) to a severe systemic reaction (anaphylactic reaction), which can result in death. Death: In general, any medical intervention can result in death, most of the time due to an unforeseen complication. ______________________________________________________________________      ______________________________________________________________________    Preparing for your procedure  Appointments: If you think you may not be able to keep your appointment, call 24-48 hours in advance to cancel. We need time to make it available to others.  During your procedure appointment there will be: No Prescription Refills. No disability issues to discussed. No medication changes or discussions.  Instructions: Food intake: Avoid eating anything solid for at least 8 hours prior to your procedure. Clear liquid intake: You may take clear liquids such as water up to 2 hours prior to your procedure. (No carbonated drinks. No soda.) Transportation: Unless otherwise stated by your physician, bring a driver. (Driver cannot be a Market researcher, Pharmacist, community, or any other form of public transportation.) Morning Medicines: Except for blood thinners, take all of your other morning medications with a sip of water. Make sure to take your heart and blood pressure medicines. If your blood pressure's lower number is above 100, the case will be rescheduled. Blood thinners: Make sure to stop your blood  thinners as instructed.  If you take a blood thinner, but were not instructed to stop it, call our office [phone] and ask to talk to a nurse. Not stopping a blood thinner prior to certain procedures could lead to serious complications. Diabetics on insulin: Notify the staff so that you can be scheduled 1st case in the morning. If your diabetes requires high dose insulin, take only  of your normal insulin dose the morning of the procedure and notify the staff that you have done so. Preventing infections: Shower with an antibacterial soap the morning of your procedure.  Build-up your immune system: Take 1000 mg of Vitamin C with every meal (3 times a day) the day prior to your procedure. Antibiotics: Inform the nursing staff if you are taking any antibiotics or if you have any conditions that may require antibiotics prior to procedures. (Example: recent joint implants)   Pregnancy: If you are pregnant make sure to notify the nursing staff. Not doing so may result in injury to the fetus, including death.  Sickness: If you have a cold, fever, or any active infections, call and cancel or reschedule your procedure. Receiving steroids while having an infection may result in complications. Arrival: You must be in the facility at least 30 minutes prior to your scheduled procedure. Tardiness: Your scheduled time is also the cutoff time. If you do not arrive at least 15 minutes prior to your procedure, you will be rescheduled.  Children: Do not bring any children with you. Make arrangements to keep them home. Dress appropriately: There is always a possibility that your clothing may get soiled. Avoid long dresses. Valuables: Do not bring any jewelry  or valuables.  Reasons to call and reschedule or cancel your procedure: (Following these recommendations will minimize the risk of a serious complication.) Surgeries: Avoid having procedures within 2 weeks of any surgery. (Avoid for 2 weeks before or after any  surgery). Flu Shots: Avoid having procedures within 2 weeks of a flu shots or . (Avoid for 2 weeks before or after immunizations). Barium: Avoid having a procedure within 7-10 days after having had a radiological study involving the use of radiological contrast. (Myelograms, Barium swallow or enema study). Heart attacks: Avoid any elective procedures or surgeries for the initial 6 months after a "Myocardial Infarction" (Heart Attack). Blood thinners: It is imperative that you stop these medications before procedures. Let us know if you if you take any blood thinner.  Infection: Avoid procedures during or within two weeks of an infection (including chest colds or gastrointestinal problems). Symptoms associated with infections include: Localized redness, fever, chills, night sweats or profuse sweating, burning sensation when voiding, cough, congestion, stuffiness, runny nose, sore throat, diarrhea, nausea, vomiting, cold or Flu symptoms, recent or current infections. It is specially important if the infection is over the area that we intend to treat. Heart and lung problems: Symptoms that may suggest an active cardiopulmonary problem include: cough, chest pain, breathing difficulties or shortness of breath, dizziness, ankle swelling, uncontrolled high or unusually low blood pressure, and/or palpitations. If you are experiencing any of these symptoms, cancel your procedure and contact your primary care physician for an evaluation.  Remember:  Regular Business hours are:  Monday to Thursday 8:00 AM to 4:00 PM  Provider's Schedule: Delano Metz, MD:  Procedure days: Tuesday and Thursday 7:30 AM to 4:00 PM  Edward Jolly, MD:  Procedure days: Monday and Wednesday 7:30 AM to 4:00 PM Last  Updated: 02/24/2023 ______________________________________________________________________     Knee Injection A knee injection is a procedure to get medicine into your knee joint to relieve the pain, swelling, and  stiffness of arthritis. Your health care provider uses a needle to inject medicine, which may also help to lubricate and cushion your knee joint. You may need more than one injection. Tell a health care provider about: Any allergies you have. All medicines you are taking, including vitamins, herbs, eye drops, creams, and over-the-counter medicines. Any problems you or family members have had with anesthetic medicines. Any blood disorders you have. Any surgeries you have had. Any medical conditions you have. Whether you are pregnant or may be pregnant. What are the risks? Generally, this is a safe procedure. However, problems may occur, including: Infection. Bleeding. Symptoms that get worse. Damage to the area around your knee. Allergic reaction to any of the medicines. Skin reactions from repeated injections. What happens before the procedure? Ask your health care provider about: Changing or stopping your regular medicines. This is especially important if you are taking diabetes medicines or blood thinners. Taking medicines such as aspirin and ibuprofen. These medicines can thin your blood. Do not take these medicines unless your health care provider tells you to take them. Taking over-the-counter medicines, vitamins, herbs, and supplements. Plan to have a responsible adult take you home from the hospital or clinic. What happens during the procedure?  You will sit or lie down in a position for your knee to be treated. The skin over your kneecap will be cleaned with a germ-killing soap. You will be given a medicine that numbs the area (local anesthetic). You may feel some stinging. The medicine will be injected into  your knee. The needle is carefully placed between your kneecap and your knee. The medicine is injected into the joint space. The needle will be removed at the end of the procedure. A bandage (dressing) may be placed over the injection site. The procedure may vary among  health care providers and hospitals. What can I expect after the procedure? Your blood pressure, heart rate, breathing rate, and blood oxygen level will be monitored until you leave the hospital or clinic. You may have to move your knee through its full range of motion. This helps to get all the medicine into your joint space. You will be watched to make sure that you do not have a reaction to the injected medicine. You may feel more pain, swelling, and warmth than you did before the injection. This reaction may last about 1-2 days. Follow these instructions at home: Medicines Take over-the-counter and prescription medicines only as told by your health care provider. Ask your health care provider if the medicine prescribed to you requires you to avoid driving or using machinery. Do not take medicines such as aspirin and ibuprofen unless your health care provider tells you to take them. Injection site care Follow instructions from your health care provider about: How to take care of your puncture site. When and how you should change your dressing. When you should remove your dressing. Check your injection area every day for signs of infection. Check for: More redness, swelling, or pain after 2 days. Fluid or blood. Pus or a bad smell. Warmth. Managing pain, stiffness, and swelling  If directed, put ice on the injection area. To do this: Put ice in a plastic bag. Place a towel between your skin and the bag. Leave the ice on for 20 minutes, 2-3 times per day. Remove the ice if your skin turns bright red. This is very important. If you cannot feel pain, heat, or cold, you have a greater risk of damage to the area. Do not apply heat to your knee. Raise (elevate) the injection area above the level of your heart while you are sitting or lying down. General instructions If you were given a dressing, keep it dry until your health care provider says it can be removed. Ask your health care  provider when you can start showering or bathing. Avoid strenuous activities for as long as directed by your health care provider. Ask your health care provider when you can return to your normal activities. Keep all follow-up visits. This is important. You may need more injections. Contact a health care provider if you have: A fever. Warmth in your injection area. Fluid, blood, or pus coming from your injection site. Symptoms at your injection site that last longer than 2 days after your procedure. Get help right away if: Your knee turns very red. Your knee becomes very swollen. Your knee is in severe pain. Summary A knee injection is a procedure to get medicine into your knee joint to relieve the pain, swelling, and stiffness of arthritis. A needle is carefully placed between your kneecap and your knee to inject medicine into the joint space. Before the procedure, ask your health care provider about changing or stopping your regular medicines, especially if you are taking diabetes medicines or blood thinners. Contact your health care provider if you have any problems or questions after your procedure. This information is not intended to replace advice given to you by your health care provider. Make sure you discuss any questions you have with your  health care provider. Document Revised: 12/07/2019 Document Reviewed: 12/07/2019 Else[employer] Patient Education  2024 [employer] Facet Blocks Patient Information  Description: The facets are joints in the spine between the vertebrae.  Like any joints in the body, facets can become irritated and painful.  Arthritis can also effect the facets.  By injecting steroids and local anesthetic in and around these joints, we can temporarily block the nerve supply to them.  Steroids act directly on irritated nerves and tissues to reduce selling and inflammation which often leads to decreased pain.  Facet blocks may be done anywhere along the spine from the  neck to the low back depending upon the location of your pain.   After numbing the skin with local anesthetic (like Novocaine), a small needle is passed onto the facet joints under x-ray guidance.  You may experience a sensation of pressure while this is being done.  The entire block usually lasts about 15-25 minutes.   Conditions which may be treated by facet blocks:  Low back/buttock pain Neck/shoulder pain Certain types of headaches  Preparation for the injection:  Do not eat any solid food or dairy products within 8 hours of your appointment. You may drink clear liquid up to 3 hours before appointment.  Clear liquids include water, black coffee, juice or soda.  No milk or cream please. You may take your regular medication, including pain medications, with a sip of water before your appointment.  Diabetics should hold regular insulin (if taken separately) and take 1/2 normal NPH dose the morning of the procedure.  Carry some sugar containing items with you to your appointment. A driver must accompany you and be prepared to drive you home after your procedure. Bring all your current medications with you. An IV may be inserted and sedation may be given at the discretion of the physician. A blood pressure cuff, EKG and other monitors will often be applied during the procedure.  Some patients may need to have extra oxygen administered for a short period. You will be asked to provide medical information, including your allergies and medications, prior to the procedure.  We must know immediately if you are taking blood thinners (like Coumadin/Warfarin) or if you are allergic to IV iodine contrast (dye).  We must know if you could possible be pregnant.  Possible side-effects:  Bleeding from needle site Infection (rare, may require surgery) Nerve injury (rare) Numbness & tingling (temporary) Difficulty urinating (rare, temporary) Spinal headache (a headache worse with upright  posture) Light-headedness (temporary) Pain at injection site (serveral days) Decreased blood pressure (rare, temporary) Weakness in arm/leg (temporary) Pressure sensation in back/neck (temporary)   Call if you experience:  Fever/chills associated with headache or increased back/neck pain Headache worsened by an upright position New onset, weakness or numbness of an extremity below the injection site Hives or difficulty breathing (go to the emergency room) Inflammation or drainage at the injection site(s) Severe back/neck pain greater than usual New symptoms which are concerning to you  Please note:  Although the local anesthetic injected can often make your back or neck feel good for several hours after the injection, the pain will likely return. It takes 3-7 days for steroids to work.  You may not notice any pain relief for at least one week.  If effective, we will often do a series of 2-3 injections spaced 3-6 weeks apart to maximally decrease your pain.  After the initial series, you may be a candidate for a more permanent nerve block  of the facets.  If you have any questions, please call #[phone] [facility] Patient Information  Description: The epidural space surrounds the nerves as they exit the spinal cord.  In some patients, the nerves can be compressed and inflamed by a bulging disc or a tight spinal canal (spinal stenosis).  By injecting steroids into the epidural space, we can bring irritated nerves into direct contact with a potentially helpful medication.  These steroids act directly on the irritated nerves and can reduce swelling and inflammation which often leads to decreased pain.  Epidural steroids may be injected anywhere along the spine and from the neck to the low back depending upon the location of your pain.   After numbing the skin with local anesthetic (like Novocaine), a small needle is passed into  the epidural space slowly.  You may experience a sensation of pressure while this is being done.  The entire block usually last less than 10 minutes.  Conditions which may be treated by epidural steroids:  Low back and leg pain Neck and arm pain Spinal stenosis Post-laminectomy syndrome Herpes zoster (shingles) pain Pain from compression fractures  Preparation for the injection:  Do not eat any solid food or dairy products within 8 hours of your appointment.  You may drink clear liquids up to 3 hours before appointment.  Clear liquids include water, black coffee, juice or soda.  No milk or cream please. You may take your regular medication, including pain medications, with a sip of water before your appointment  Diabetics should hold regular insulin (if taken separately) and take 1/2 normal NPH dos the morning of the procedure.  Carry some sugar containing items with you to your appointment. A driver must accompany you and be prepared to drive you home after your procedure.  Bring all your current medications with your. An IV may be inserted and sedation may be given at the discretion of the physician.   A blood pressure cuff, EKG and other monitors will often be applied during the procedure.  Some patients may need to have extra oxygen administered for a short period. You will be asked to provide medical information, including your allergies, prior to the procedure.  We must know immediately if you are taking blood thinners (like Coumadin/Warfarin)  Or if you are allergic to IV iodine contrast (dye). We must know if you could possible be pregnant.  Possible side-effects: Bleeding from needle site Infection (rare, may require surgery) Nerve injury (rare) Numbness & tingling (temporary) Difficulty urinating (rare, temporary) Spinal headache ( a headache worse with upright posture) Light -headedness (temporary) Pain at injection site (several days) Decreased blood pressure  (temporary) Weakness in arm/leg (temporary) Pressure sensation in back/neck (temporary)  Call if you experience: Fever/chills associated with headache or increased back/neck pain. Headache worsened by an upright position. New onset weakness or numbness of an extremity below the injection site Hives or difficulty breathing (go to the emergency room) Inflammation or drainage at the infection site Severe back/neck pain Any new symptoms which are concerning to you  Please note:  Although the local anesthetic injected can often make your back or neck feel good for several hours after the injection, the pain will likely return.  It takes 3-7 days for steroids to work in the epidural space.  You may not notice any pain relief for at least that one week.  If effective, we will often do a series of three injections spaced 3-6  weeks apart to maximally decrease your pain.  After the initial series, we generally will wait several months before considering a repeat injection of the same type.  If you have any questions, please call [phone] [facility]

## 2023-03-19 ENCOUNTER — Telehealth: Payer: Self-pay | Admitting: Student in an Organized Health Care Education/Training Program

## 2023-03-19 NOTE — Telephone Encounter (Signed)
Patient asking if ok to take Tramadol and Gabapentin simultaneously. I informed him that it is ok to do that.  I asked him who prescribes the Gabapentin, since Dr. Cherylann Ratel has not prescribed since 03/2022, he stated that PCP prescribes.

## 2023-03-19 NOTE — Telephone Encounter (Signed)
PT wants to know if he should be taking Tramadol and Gabapentin medication. Please give patient a call. TY

## 2023-03-24 ENCOUNTER — Telehealth: Payer: Self-pay | Admitting: Student in an Organized Health Care Education/Training Program

## 2023-03-24 NOTE — Telephone Encounter (Signed)
Returned patient phone call as requested, told him that stopping the Tramadol as he was not tolerating it wast he correct thing to do.  He has an appt on 04/01/23 for BLF and hyalgan knee injection and will discuss further at that time with BL.

## 2023-03-24 NOTE — Telephone Encounter (Signed)
PT states that he D/C the Tramadol prescription on 03/22/23. PT states that the medication had him sweating, and hallucination.PT states since he stop the Tramadol he has been feeling okay. Please give patient a call. TY

## 2023-04-01 ENCOUNTER — Ambulatory Visit
Payer: No Typology Code available for payment source | Attending: Student in an Organized Health Care Education/Training Program | Admitting: Student in an Organized Health Care Education/Training Program

## 2023-04-01 ENCOUNTER — Ambulatory Visit
Admission: RE | Admit: 2023-04-01 | Discharge: 2023-04-01 | Disposition: A | Discharge status: Home / Self Care | Payer: No Typology Code available for payment source | Source: Ambulatory Visit | Attending: Student in an Organized Health Care Education/Training Program | Admitting: Student in an Organized Health Care Education/Training Program

## 2023-04-01 ENCOUNTER — Encounter: Payer: Self-pay | Admitting: Student in an Organized Health Care Education/Training Program

## 2023-04-01 VITALS — BP 153/112 | HR 114 | Temp 98.6°F | Resp 18 | Ht 71.0 in | Wt 220.0 lb

## 2023-04-01 DIAGNOSIS — M47816 Spondylosis without myelopathy or radiculopathy, lumbar region: Secondary | ICD-10-CM | POA: Diagnosis present

## 2023-04-01 DIAGNOSIS — M25561 Pain in right knee: Secondary | ICD-10-CM | POA: Diagnosis present

## 2023-04-01 DIAGNOSIS — M170 Bilateral primary osteoarthritis of knee: Secondary | ICD-10-CM

## 2023-04-01 DIAGNOSIS — M5416 Radiculopathy, lumbar region: Secondary | ICD-10-CM | POA: Insufficient documentation

## 2023-04-01 DIAGNOSIS — G894 Chronic pain syndrome: Secondary | ICD-10-CM | POA: Insufficient documentation

## 2023-04-01 DIAGNOSIS — M4726 Other spondylosis with radiculopathy, lumbar region: Secondary | ICD-10-CM | POA: Diagnosis not present

## 2023-04-01 DIAGNOSIS — M25562 Pain in left knee: Secondary | ICD-10-CM | POA: Insufficient documentation

## 2023-04-01 DIAGNOSIS — G8929 Other chronic pain: Secondary | ICD-10-CM | POA: Diagnosis present

## 2023-04-01 MED ORDER — ROPIVACAINE HCL 2 MG/ML IJ SOLN
9.0000 mL | Freq: Once | INTRAMUSCULAR | Status: AC
  Administered 2023-04-01: 18 mL via PERINEURAL

## 2023-04-01 MED ORDER — DEXAMETHASONE SODIUM PHOSPHATE 10 MG/ML IJ SOLN
10.0000 mg | Freq: Once | INTRAMUSCULAR | Status: AC
  Administered 2023-04-01: 10 mg
  Filled 2023-04-01: qty 1

## 2023-04-01 MED ORDER — IOHEXOL 180 MG/ML  SOLN
10.0000 mL | Freq: Once | INTRAMUSCULAR | Status: AC
  Administered 2023-04-01: 10 mL via EPIDURAL
  Filled 2023-04-01: qty 20

## 2023-04-01 MED ORDER — SODIUM HYALURONATE (VISCOSUP) 20 MG/2ML IX SOSY
2.0000 mL | PREFILLED_SYRINGE | Freq: Once | INTRA_ARTICULAR | Status: AC
  Administered 2023-04-01: 20 mg via INTRA_ARTICULAR

## 2023-04-01 MED ORDER — DIAZEPAM 5 MG PO TABS
ORAL_TABLET | ORAL | Status: AC
  Filled 2023-04-01: qty 2

## 2023-04-01 MED ORDER — ROPIVACAINE HCL 2 MG/ML IJ SOLN: 9.0000 mL | Freq: Once | INTRAMUSCULAR | Status: DC

## 2023-04-01 MED ORDER — DIAZEPAM 5 MG PO TABS
10.0000 mg | ORAL_TABLET | ORAL | Status: AC
  Administered 2023-04-01: 10 mg via ORAL

## 2023-04-01 MED ORDER — SODIUM CHLORIDE 0.9% FLUSH
2.0000 mL | Freq: Once | INTRAVENOUS | Status: AC
  Administered 2023-04-01: 2 mL

## 2023-04-01 MED ORDER — LIDOCAINE HCL 2 % IJ SOLN
20.0000 mL | Freq: Once | INTRAMUSCULAR | Status: AC
  Administered 2023-04-01: 400 mg
  Filled 2023-04-01: qty 20

## 2023-04-01 MED ORDER — DEXAMETHASONE SODIUM PHOSPHATE 10 MG/ML IJ SOLN
10.0000 mg | Freq: Once | INTRAMUSCULAR | Status: AC
  Administered 2023-04-01: 10 mg

## 2023-04-01 MED ORDER — ROPIVACAINE HCL 2 MG/ML IJ SOLN
INTRAMUSCULAR | Status: AC
  Filled 2023-04-01: qty 20

## 2023-04-01 MED ORDER — DEXAMETHASONE SODIUM PHOSPHATE 10 MG/ML IJ SOLN
INTRAMUSCULAR | Status: AC
  Filled 2023-04-01: qty 1

## 2023-04-01 NOTE — Progress Notes (Signed)
Safety precautions to be maintained throughout the outpatient stay will include: orient to surroundings, keep bed in low position, maintain call bell within reach at all times, provide assistance with transfer out of bed and ambulation.  

## 2023-04-01 NOTE — Patient Instructions (Signed)

## 2023-04-01 NOTE — Progress Notes (Signed)
PROVIDER NOTE: Interpretation of information contained herein should be left to medically-trained personnel. Specific patient instructions are provided elsewhere under "Patient Instructions" section of medical record. This document was created in part using STT-dictation technology, any transcriptional errors that may result from this process are unintentional.  Patient: Peter Webster Type: Established DOB: [birth year] MRN: [id number] PCP: [facility]  Service: Procedure DOS: 04/01/2023 Setting: Ambulatory Location: Ambulatory outpatient facility Delivery: Face-to-face Provider: Edward Jolly, MD Specialty: Interventional Pain Management Specialty designation: 09 Location: Outpatient facility Ref. Prov.: [facility]       Interventional Therapy   Procedure: Lumbar epidural steroid injection (LESI) (interlaminar) #1    Laterality: Left   Level:  L4-5 Level.  Imaging: Fluoroscopic guidance         Anesthesia: Local anesthesia (1-2% Lidocaine) Sedation: Minimal Sedation                       DOS: 04/01/2023  Performed by: Edward Jolly, MD  Purpose: Diagnostic/Therapeutic Indications: Lumbar radicular pain of intraspinal etiology of more than 4 weeks that has failed to respond to conservative therapy and is severe enough to impact quality of life or function.  Lumbar radicular pain  NAS-11 Pain score:   Pre-procedure: 8 /10   Post-procedure: 8 /10      Position / Prep / Materials:  Position: Prone w/ head of the table raised (slight reverse trendelenburg) to facilitate breathing.  Prep solution: DuraPrep (Iodine Povacrylex [0.7% available iodine] and Isopropyl Alcohol, 74% w/w) Prep Area: Entire Posterior Lumbar Region from lower scapular tip down to mid buttocks area and from flank to flank. Materials:  Tray: Epidural tray Needle(s):  Type: Epidural needle (Tuohy) Gauge (G):  22 Length: Regular (3.5-in) Qty: 1   H&P (Pre-op Assessment):  Peter Webster  is a 82 y.o. (year old), male patient, seen today for interventional treatment. He  has a past surgical history that includes Colonoscopy (2019); bilateral hernia (2018); XI robotic assisted ventral hernia (N/A, 12/18/2021); and Insertion of mesh (12/18/2021). Peter Webster has a current medication list which includes the following prescription(s): acetaminophen, acyclovir, amlodipine, calcium carb-cholecalciferol, carbidopa-levodopa, cetirizine, clobetasol ointment, dss, abdominal binder/elastic xl, fluoxetine, hydrocortisone, hydrocortisone, ibuprofen, lenalidomide, lidocaine, omeprazole, rosuvastatin, sildenafil, simvastatin, tizanidine, tramadol, triamcinolone ointment, urea, and gabapentin, and the following Facility-Administered Medications: ropivacaine (pf) 2 mg/ml (0.2%). His primarily concern today is the Back Pain and Knee Pain (bilateral)  Initial Vital Signs:  Pulse/HCG Rate: (!) 114ECG Heart Rate: (!) 117 Temp: 98.6 F (37 C) Resp: 18 BP: (!) 132/106 SpO2: 100 %  BMI: Estimated body mass index is 30.68 kg/m as calculated from the following:   Height as of this encounter: 5\' 11"  (1.803 m).   Weight as of this encounter: 220 lb (99.8 kg).  Risk Assessment: Allergies: Reviewed. He is allergic to lisinopril.  Allergy Precautions: None required Coagulopathies: Reviewed. None identified.  Blood-thinner therapy: None at this time Active Infection(s): Reviewed. None identified. Peter Webster is afebrile  Site Confirmation: Peter Webster was asked to confirm the procedure and laterality before marking the site Procedure checklist: Completed Consent: Before the procedure and under the influence of no sedative(s), amnesic(s), or anxiolytics, the patient was informed of the treatment options, risks and possible complications. To fulfill our ethical and legal obligations, as recommended by the American Medical Association's Code of Ethics, I have informed the patient of my clinical impression; the  nature and purpose of the treatment or procedure; the risks, benefits, and possible complications of the intervention;  the alternatives, including doing nothing; the risk(s) and benefit(s) of the alternative treatment(s) or procedure(s); and the risk(s) and benefit(s) of doing nothing. The patient was provided information about the general risks and possible complications associated with the procedure. These may include, but are not limited to: failure to achieve desired goals, infection, bleeding, organ or nerve damage, allergic reactions, paralysis, and death. In addition, the patient was informed of those risks and complications associated to Spine-related procedures, such as failure to decrease pain; infection (i.e.: Meningitis, epidural or intraspinal abscess); bleeding (i.e.: epidural hematoma, subarachnoid hemorrhage, or any other type of intraspinal or peri-dural bleeding); organ or nerve damage (i.e.: Any type of peripheral nerve, nerve root, or spinal cord injury) with subsequent damage to sensory, motor, and/or autonomic systems, resulting in permanent pain, numbness, and/or weakness of one or several areas of the body; allergic reactions; (i.e.: anaphylactic reaction); and/or death. Furthermore, the patient was informed of those risks and complications associated with the medications. These include, but are not limited to: allergic reactions (i.e.: anaphylactic or anaphylactoid reaction(s)); adrenal axis suppression; blood sugar elevation that in diabetics may result in ketoacidosis or comma; water retention that in patients with history of congestive heart failure may result in shortness of breath, pulmonary edema, and decompensation with resultant heart failure; weight gain; swelling or edema; medication-induced neural toxicity; particulate matter embolism and blood vessel occlusion with resultant organ, and/or nervous system infarction; and/or aseptic necrosis of one or more joints. Finally, the  patient was informed that Medicine is not an exact science; therefore, there is also the possibility of unforeseen or unpredictable risks and/or possible complications that may result in a catastrophic outcome. The patient indicated having understood very clearly. We have given the patient no guarantees and we have made no promises. Enough time was given to the patient to ask questions, all of which were answered to the patient's satisfaction. Mr. Baun has indicated that he wanted to continue with the procedure. Attestation: I, the ordering provider, attest that I have discussed with the patient the benefits, risks, side-effects, alternatives, likelihood of achieving goals, and potential problems during recovery for the procedure that I have provided informed consent. Date  Time: 04/01/2023 11:22 AM   Pre-Procedure Preparation:  Monitoring: As per clinic protocol. Respiration, ETCO2, SpO2, BP, heart rate and rhythm monitor placed and checked for adequate function Safety Precautions: Patient was assessed for positional comfort and pressure points before starting the procedure. Time-out: I initiated and conducted the "Time-out" before starting the procedure, as per protocol. The patient was asked to participate by confirming the accuracy of the "Time Out" information. Verification of the correct person, site, and procedure were performed and confirmed by me, the nursing staff, and the patient. "Time-out" conducted as per Joint Commission's Universal Protocol (UP.01.01.01). Time: 1201 Start Time: 1217 hrs.  Description/Narrative of Procedure:          Target: Epidural space via interlaminar opening, initially targeting the lower laminar border of the superior vertebral body. Region: Lumbar Approach: Percutaneous paravertebral  Rationale (medical necessity): procedure needed and proper for the diagnosis and/or treatment of the patient's medical symptoms and needs. Procedural Technique Safety  Precautions: Aspiration looking for blood return was conducted prior to all injections. At no point did we inject any substances, as a needle was being advanced. No attempts were made at seeking any paresthesias. Safe injection practices and needle disposal techniques used. Medications properly checked for expiration dates. SDV (single dose vial) medications used. Description of the Procedure: Protocol  guidelines were followed. The procedure needle was introduced through the skin, ipsilateral to the reported pain, and advanced to the target area. Bone was contacted and the needle walked caudad, until the lamina was cleared. The epidural space was identified using "loss-of-resistance technique" with 2-3 ml of PF-NaCl (0.9% NSS), in a 5cc LOR glass syringe.  5 cc solution made of 2 cc of preservative-free saline, 2 cc of 0.2% ropivacaine, 1 cc of Decadron 10 mg/cc.   Vitals:   04/01/23 1205 04/01/23 1210 04/01/23 1215 04/01/23 1221  BP: (!) 153/97 (!) 134/100 (!) 147/104 (!) 153/112  Pulse:      Resp: (!) 26 (!) 26 (!) 24 18  Temp:      SpO2: 99% 98% 95% 100%  Weight:      Height:        Start Time: 1217 hrs. End Time: 1220 hrs.  Imaging Guidance (Spinal):          Type of Imaging Technique: Fluoroscopy Guidance (Spinal) Indication(s): Assistance in needle guidance and placement for procedures requiring needle placement in or near specific anatomical locations not easily accessible without such assistance. Exposure Time: Please see nurses notes. Contrast: Before injecting any contrast, we confirmed that the patient did not have an allergy to iodine, shellfish, or radiological contrast. Once satisfactory needle placement was completed at the desired level, radiological contrast was injected. Contrast injected under live fluoroscopy. No contrast complications. See chart for type and volume of contrast used. Fluoroscopic Guidance: I was personally present during the use of fluoroscopy. "Tunnel  Vision Technique" used to obtain the best possible view of the target area. Parallax error corrected before commencing the procedure. "Direction-depth-direction" technique used to introduce the needle under continuous pulsed fluoroscopy. Once target was reached, antero-posterior, oblique, and lateral fluoroscopic projection used confirm needle placement in all planes. Images permanently stored in EMR. Interpretation: I personally interpreted the imaging intraoperatively. Adequate needle placement confirmed in multiple planes. Appropriate spread of contrast into desired area was observed. No evidence of afferent or efferent intravascular uptake. No intrathecal or subarachnoid spread observed. Permanent images saved into the patient's record.  Antibiotic Prophylaxis:   Anti-infectives (From admission, onward)    None      Indication(s): None identified  Post-operative Assessment:  Post-procedure Vital Signs:  Pulse/HCG Rate: (!) 114(!) 113 Temp: 98.6 F (37 C) Resp: 18 BP: (!) 153/112 SpO2: 100 %  EBL: None  Complications: No immediate post-treatment complications observed by team, or reported by patient.  Note: The patient tolerated the entire procedure well. A repeat set of vitals were taken after the procedure and the patient was kept under observation following institutional policy, for this type of procedure. Post-procedural neurological assessment was performed, showing return to baseline, prior to discharge. The patient was provided with post-procedure discharge instructions, including a section on how to identify potential problems. Should any problems arise concerning this procedure, the patient was given instructions to immediately contact us, at any time, without hesitation. In any case, we plan to contact the patient by telephone for a follow-up status report regarding this interventional procedure.  Comments:  No additional relevant information.  Plan of Care (POC)  Orders:   Orders Placed This Encounter  Procedures   DG PAIN CLINIC C-ARM 1-60 MIN NO REPORT    Intraoperative interpretation by procedural physician at [facility].    Standing Status:   Standing    Number of Occurrences:   1    Order Specific Question:   Reason  for exam:    Answer:   Assistance in needle guidance and placement for procedures requiring needle placement in or near specific anatomical locations not easily accessible without such assistance.    Medications ordered for procedure: Meds ordered this encounter  Medications   iohexol (OMNIPAQUE) 180 MG/ML injection 10 mL    Must be Myelogram-compatible. If not available, you may substitute with a water-soluble, non-ionic, hypoallergenic, myelogram-compatible radiological contrast medium.   lidocaine (XYLOCAINE) 2 % (with pres) injection 400 mg   diazepam (VALIUM) tablet 10 mg    Make sure Flumazenil is available in the pyxis when using this medication. If oversedation occurs, administer 0.2 mg IV over 15 sec. If after 45 sec no response, administer 0.2 mg again over 1 min; may repeat at 1 min intervals; not to exceed 4 doses (1 mg)   dexamethasone (DECADRON) injection 10 mg   dexamethasone (DECADRON) injection 10 mg   Sodium Hyaluronate (Viscosup) SOSY 20 mg    Do not substitute. Deliver to facility day before procedure.   Sodium Hyaluronate (Viscosup) SOSY 20 mg    Do not substitute. Deliver to facility day before procedure.   ropivacaine (PF) 2 mg/mL (0.2%) (NAROPIN) injection 9 mL   ropivacaine (PF) 2 mg/mL (0.2%) (NAROPIN) injection 9 mL   sodium chloride flush (NS) 0.9 % injection 2 mL   Medications administered: We administered iohexol, lidocaine, diazepam, dexamethasone, dexamethasone, Sodium Hyaluronate (Viscosup), Sodium Hyaluronate (Viscosup), ropivacaine (PF) 2 mg/mL (0.2%), and sodium chloride flush.  See the medical record for exact dosing, route, and time of administration.  Follow-up plan:   Return in  about 4 weeks (around 04/29/2023) for PPE, F2F.       B/L Hyalgan 12/24/22, 04/01/2023.  L4-L5 ESI 04/01/2023, bilateral lumbar facet blocks 04/01/2023      Recent Visits Date Type Provider Dept  03/17/23 Office Visit Edward Jolly, MD [facility]  Showing recent visits within past 90 days and meeting all other requirements Today's Visits Date Type Provider Dept  04/01/23 Procedure visit Edward Jolly, MD [facility]  Showing today's visits and meeting all other requirements Future Appointments Date Type Provider Dept  04/29/23 Appointment Edward Jolly, MD [facility]  Showing future appointments within next 90 days and meeting all other requirements  Disposition: Discharge home  Discharge (Date  Time): 04/01/2023;   hrs.   Primary Care Physician: [facility] Location: [facility] Note by: Edward Jolly, MD (TTS technology used. I apologize for any typographical errors that were not detected and corrected.) Date: 04/01/2023; Time: 12:23 PM  Disclaimer:  Medicine is not an Visual merchandiser. The only guarantee in medicine is that nothing is guaranteed. It is important to note that the decision to proceed with this intervention was based on the information collected from the patient. The Data and conclusions were drawn from the patient's questionnaire, the interview, and the physical examination. Because the information was provided in large part by the patient, it cannot be guaranteed that it has not been purposely or unconsciously manipulated. Every effort has been made to obtain as much relevant data as possible for this evaluation. It is important to note that the conclusions that lead to this procedure are derived in large part from the available data. Always take into account that the treatment will also be dependent on availability of resources and existing treatment guidelines, considered by other Pain Management  Practitioners as being common knowledge and practice, at the time of the intervention. For  Medico-Legal purposes, it is also important to point out that variation in procedural techniques and pharmacological choices are the acceptable norm. The indications, contraindications, technique, and results of the above procedure should only be interpreted and judged by a Board-Certified Interventional Pain Specialist with extensive familiarity and expertise in the same exact procedure and technique.

## 2023-04-01 NOTE — Progress Notes (Signed)
PROVIDER NOTE: Interpretation of information contained herein should be left to medically-trained personnel. Specific patient instructions are provided elsewhere under "Patient Instructions" section of medical record. This document was created in part using STT-dictation technology, any transcriptional errors that may result from this process are unintentional.  Patient: Peter Webster Type: Established DOB: 05/11/1941 MRN: [id number] PCP: [facility]  Service: Procedure DOS: 04/01/2023 Setting: Ambulatory Location: Ambulatory outpatient facility Delivery: Face-to-face Provider: Edward Jolly, MD Specialty: Interventional Pain Management Specialty designation: 09 Location: Outpatient facility Ref. Prov.: [facility]       Interventional Therapy   Procedure: Lumbar Facet, Medial Branch Block(s) #1  Laterality: Bilateral  Level: L3, L4, and L5 Medial Branch Level(s). Injecting these levels blocks the L3-4 and L4-5 lumbar facet joints. Imaging: Fluoroscopic guidance         Anesthesia: Local anesthesia (1-2% Lidocaine) Sedation: Minimal Sedation                       DOS: 04/01/2023 Performed by: Edward Jolly, MD  Primary Purpose: Diagnostic/Therapeutic Indications: Low back pain severe enough to impact quality of life or function. Lumbar facet arthropathy NAS-11 Pain score:   Pre-procedure: 8 /10   Post-procedure: 8 /10     Position / Prep / Materials:  Position: Prone  Prep solution: DuraPrep (Iodine Povacrylex [0.7% available iodine] and Isopropyl Alcohol, 74% w/w) Area Prepped: Posterolateral Lumbosacral Spine (Wide prep: From the lower border of the scapula down to the end of the tailbone and from flank to flank.)  Materials:  Tray: Block Needle(s):  Type: Spinal  Gauge (G): 22  Length: 3.5-in Qty: 2      H&P (Pre-op Assessment):  Peter Webster is a 82 y.o. (year old), male patient, seen today for interventional treatment. He  has a past surgical  history that includes Colonoscopy (2019); bilateral hernia (2018); XI robotic assisted ventral hernia (N/A, 12/18/2021); and Insertion of mesh (12/18/2021). Peter Webster has a current medication list which includes the following prescription(s): acetaminophen, acyclovir, amlodipine, calcium carb-cholecalciferol, carbidopa-levodopa, cetirizine, clobetasol ointment, dss, abdominal binder/elastic xl, fluoxetine, hydrocortisone, hydrocortisone, ibuprofen, lenalidomide, lidocaine, omeprazole, rosuvastatin, sildenafil, simvastatin, tizanidine, tramadol, triamcinolone ointment, urea, and gabapentin, and the following Facility-Administered Medications: ropivacaine (pf) 2 mg/ml (0.2%). His primarily concern today is the Back Pain and Knee Pain (bilateral)  Initial Vital Signs:  Pulse/HCG Rate: (!) 114ECG Heart Rate: (!) 117 Temp: 98.6 F (37 C) Resp: 18 BP: (!) 132/106 SpO2: 100 %  BMI: Estimated body mass index is 30.68 kg/m as calculated from the following:   Height as of this encounter: 5\' 11"  (1.803 m).   Weight as of this encounter: 220 lb (99.8 kg).  Risk Assessment: Allergies: Reviewed. He is allergic to lisinopril.  Allergy Precautions: None required Coagulopathies: Reviewed. None identified.  Blood-thinner therapy: None at this time Active Infection(s): Reviewed. None identified. Peter Webster is afebrile  Site Confirmation: Peter Webster was asked to confirm the procedure and laterality before marking the site Procedure checklist: Completed Consent: Before the procedure and under the influence of no sedative(s), amnesic(s), or anxiolytics, the patient was informed of the treatment options, risks and possible complications. To fulfill our ethical and legal obligations, as recommended by the American Medical Association's Code of Ethics, I have informed the patient of my clinical impression; the nature and purpose of the treatment or procedure; the risks, benefits, and possible complications of the  intervention; the alternatives, including doing nothing; the risk(s) and benefit(s) of the alternative treatment(s) or procedure(s); and  the risk(s) and benefit(s) of doing nothing. The patient was provided information about the general risks and possible complications associated with the procedure. These may include, but are not limited to: failure to achieve desired goals, infection, bleeding, organ or nerve damage, allergic reactions, paralysis, and death. In addition, the patient was informed of those risks and complications associated to Spine-related procedures, such as failure to decrease pain; infection (i.e.: Meningitis, epidural or intraspinal abscess); bleeding (i.e.: epidural hematoma, subarachnoid hemorrhage, or any other type of intraspinal or peri-dural bleeding); organ or nerve damage (i.e.: Any type of peripheral nerve, nerve root, or spinal cord injury) with subsequent damage to sensory, motor, and/or autonomic systems, resulting in permanent pain, numbness, and/or weakness of one or several areas of the body; allergic reactions; (i.e.: anaphylactic reaction); and/or death. Furthermore, the patient was informed of those risks and complications associated with the medications. These include, but are not limited to: allergic reactions (i.e.: anaphylactic or anaphylactoid reaction(s)); adrenal axis suppression; blood sugar elevation that in diabetics may result in ketoacidosis or comma; water retention that in patients with history of congestive heart failure may result in shortness of breath, pulmonary edema, and decompensation with resultant heart failure; weight gain; swelling or edema; medication-induced neural toxicity; particulate matter embolism and blood vessel occlusion with resultant organ, and/or nervous system infarction; and/or aseptic necrosis of one or more joints. Finally, the patient was informed that Medicine is not an exact science; therefore, there is also the possibility of  unforeseen or unpredictable risks and/or possible complications that may result in a catastrophic outcome. The patient indicated having understood very clearly. We have given the patient no guarantees and we have made no promises. Enough time was given to the patient to ask questions, all of which were answered to the patient's satisfaction. Mr. Dardar has indicated that he wanted to continue with the procedure. Attestation: I, the ordering provider, attest that I have discussed with the patient the benefits, risks, side-effects, alternatives, likelihood of achieving goals, and potential problems during recovery for the procedure that I have provided informed consent. Date  Time: 04/01/2023 11:22 AM   Pre-Procedure Preparation:  Monitoring: As per clinic protocol. Respiration, ETCO2, SpO2, BP, heart rate and rhythm monitor placed and checked for adequate function Safety Precautions: Patient was assessed for positional comfort and pressure points before starting the procedure. Time-out: I initiated and conducted the "Time-out" before starting the procedure, as per protocol. The patient was asked to participate by confirming the accuracy of the "Time Out" information. Verification of the correct person, site, and procedure were performed and confirmed by me, the nursing staff, and the patient. "Time-out" conducted as per Joint Commission's Universal Protocol (UP.01.01.01). Time: 1201 Start Time: 1217 hrs.  Description of Procedure:          Laterality: (see above) Targeted Levels: (see above)  Safety Precautions: Aspiration looking for blood return was conducted prior to all injections. At no point did we inject any substances, as a needle was being advanced. Before injecting, the patient was told to immediately notify me if he was experiencing any new onset of "ringing in the ears, or metallic taste in the mouth". No attempts were made at seeking any paresthesias. Safe injection practices and needle  disposal techniques used. Medications properly checked for expiration dates. SDV (single dose vial) medications used. After the completion of the procedure, all disposable equipment used was discarded in the proper designated medical waste containers. Local Anesthesia: Protocol guidelines were followed. The patient was positioned  over the fluoroscopy table. The area was prepped in the usual manner. The time-out was completed. The target area was identified using fluoroscopy. A 12-in long, straight, sterile hemostat was used with fluoroscopic guidance to locate the targets for each level blocked. Once located, the skin was marked with an approved surgical skin marker. Once all sites were marked, the skin (epidermis, dermis, and hypodermis), as well as deeper tissues (fat, connective tissue and muscle) were infiltrated with a small amount of a short-acting local anesthetic, loaded on a 10cc syringe with a 25G, 1.5-in  Needle. An appropriate amount of time was allowed for local anesthetics to take effect before proceeding to the next step. Local Anesthetic: Lidocaine 2.0% The unused portion of the local anesthetic was discarded in the proper designated containers. Technical description of process:  L3 Medial Branch Nerve Block (MBB): The target area for the L3 medial branch is at the junction of the postero-lateral aspect of the superior articular process and the superior, posterior, and medial edge of the transverse process of L4. Under fluoroscopic guidance, a Quincke needle was inserted until contact was made with os over the superior postero-lateral aspect of the pedicular shadow (target area). After negative aspiration for blood, 2mL of the nerve block solution was injected without difficulty or complication. The needle was removed intact. L4 Medial Branch Nerve Block (MBB): The target area for the L4 medial branch is at the junction of the postero-lateral aspect of the superior articular process and the  superior, posterior, and medial edge of the transverse process of L5. Under fluoroscopic guidance, a Quincke needle was inserted until contact was made with os over the superior postero-lateral aspect of the pedicular shadow (target area). After negative aspiration for blood, 2mL of the nerve block solution was injected without difficulty or complication. The needle was removed intact. L5 Medial Branch Nerve Block (MBB): The target area for the L5 medial branch is at the junction of the postero-lateral aspect of the superior articular process and the superior, posterior, and medial edge of the sacral ala. Under fluoroscopic guidance, a Quincke needle was inserted until contact was made with os over the superior postero-lateral aspect of the pedicular shadow (target area). After negative aspiration for blood, 2mL of the nerve block solution was injected without difficulty or complication. The needle was removed intact.   Once the entire procedure was completed, the treated area was cleaned, making sure to leave some of the prepping solution back to take advantage of its long term bactericidal properties.         Illustration of the posterior view of the lumbar spine and the posterior neural structures. Laminae of L2 through S1 are labeled. DPRL5, dorsal primary ramus of L5; DPRS1, dorsal primary ramus of S1; DPR3, dorsal primary ramus of L3; FJ, facet (zygapophyseal) joint L3-L4; I, inferior articular process of L4; LB1, lateral branch of dorsal primary ramus of L1; IAB, inferior articular branches from L3 medial branch (supplies L4-L5 facet joint); IBP, intermediate branch plexus; MB3, medial branch of dorsal primary ramus of L3; NR3, third lumbar nerve root; S, superior articular process of L5; SAB, superior articular branches from L4 (supplies L4-5 facet joint also); TP3, transverse process of L3.   Facet Joint Innervation (* possible contribution)  L1-2 T12, L1 (L2*)  Medial Branch  L2-3 L1, L2  (L3*)         "          "  L3-4 L2, L3 (L4*)         "          "  L4-5 L3, L4 (L5*)         "          "  L5-S1 L4, L5, S1          "          "    Vitals:   04/01/23 1205 04/01/23 1210 04/01/23 1215 04/01/23 1221  BP: (!) 153/97 (!) 134/100 (!) 147/104 (!) 153/112  Pulse:      Resp: (!) 26 (!) 26 (!) 24 18  Temp:      SpO2: 99% 98% 95% 100%  Weight:      Height:         End Time: 1220 hrs.  Imaging Guidance (Spinal):          Type of Imaging Technique: Fluoroscopy Guidance (Spinal) Indication(s): Assistance in needle guidance and placement for procedures requiring needle placement in or near specific anatomical locations not easily accessible without such assistance. Exposure Time: Please see nurses notes. Contrast: None used. Fluoroscopic Guidance: I was personally present during the use of fluoroscopy. "Tunnel Vision Technique" used to obtain the best possible view of the target area. Parallax error corrected before commencing the procedure. "Direction-depth-direction" technique used to introduce the needle under continuous pulsed fluoroscopy. Once target was reached, antero-posterior, oblique, and lateral fluoroscopic projection used confirm needle placement in all planes. Images permanently stored in EMR. Interpretation: No contrast injected. I personally interpreted the imaging intraoperatively. Adequate needle placement confirmed in multiple planes. Permanent images saved into the patient's record.  Post-operative Assessment:  Post-procedure Vital Signs:  Pulse/HCG Rate: (!) 114(!) 113 Temp: 98.6 F (37 C) Resp: 18 BP: (!) 153/112 SpO2: 100 %  EBL: None  Complications: No immediate post-treatment complications observed by team, or reported by patient.  Note: The patient tolerated the entire procedure well. A repeat set of vitals were taken after the procedure and the patient was kept under observation following institutional policy, for this type of procedure.  Post-procedural neurological assessment was performed, showing return to baseline, prior to discharge. The patient was provided with post-procedure discharge instructions, including a section on how to identify potential problems. Should any problems arise concerning this procedure, the patient was given instructions to immediately contact us, at any time, without hesitation. In any case, we plan to contact the patient by telephone for a follow-up status report regarding this interventional procedure.  Comments:  No additional relevant information.  Plan of Care (POC)  Orders:  Orders Placed This Encounter  Procedures   DG PAIN [facility] C-ARM 1-60 MIN NO REPORT    Intraoperative interpretation by procedural physician at [facility].    Standing Status:   Standing    Number of Occurrences:   1    Order Specific Question:   Reason for exam:    Answer:   Assistance in needle guidance and placement for procedures requiring needle placement in or near specific anatomical locations not easily accessible without such assistance.    Medications ordered for procedure: Meds ordered this encounter  Medications   iohexol (OMNIPAQUE) 180 MG/ML injection 10 mL    Must be Myelogram-compatible. If not available, you may substitute with a water-soluble, non-ionic, hypoallergenic, myelogram-compatible radiological contrast medium.   lidocaine (XYLOCAINE) 2 % (with pres) injection 400 mg   diazepam (VALIUM) tablet 10 mg    Make sure Flumazenil is available in the pyxis when using this medication. If oversedation occurs, administer 0.2 mg IV over 15 sec. If after 45 sec no response,  administer 0.2 mg again over 1 min; may repeat at 1 min intervals; not to exceed 4 doses (1 mg)   dexamethasone (DECADRON) injection 10 mg   dexamethasone (DECADRON) injection 10 mg   Sodium Hyaluronate (Viscosup) SOSY 20 mg    Do not substitute. Deliver to facility day before procedure.   Sodium Hyaluronate (Viscosup)  SOSY 20 mg    Do not substitute. Deliver to facility day before procedure.   ropivacaine (PF) 2 mg/mL (0.2%) (NAROPIN) injection 9 mL   ropivacaine (PF) 2 mg/mL (0.2%) (NAROPIN) injection 9 mL   sodium chloride flush (NS) 0.9 % injection 2 mL   Medications administered: We administered iohexol, lidocaine, diazepam, dexamethasone, dexamethasone, Sodium Hyaluronate (Viscosup), Sodium Hyaluronate (Viscosup), ropivacaine (PF) 2 mg/mL (0.2%), and sodium chloride flush.  See the medical record for exact dosing, route, and time of administration.  Follow-up plan:   Return in about 4 weeks (around 04/29/2023) for PPE, F2F.       B/L Hyalgan 12/24/22, 04/01/2023.  Left L4-L5 ESI and bilateral L3, L4, L5 facet medial branch nerve block 04/01/2023      Recent Visits Date Type Provider Dept  03/17/23 Office Visit Edward Jolly, MD [facility]  Showing recent visits within past 90 days and meeting all other requirements Today's Visits Date Type Provider Dept  04/01/23 Procedure visit Edward Jolly, MD [facility]  Showing today's visits and meeting all other requirements Future Appointments Date Type Provider Dept  04/29/23 Appointment Edward Jolly, MD [facility]  Showing future appointments within next 90 days and meeting all other requirements  Disposition: Discharge home  Discharge (Date  Time): 04/01/2023;   hrs.   Primary Care Physician: [facility] Location: [facility] Note by: Edward Jolly, MD (TTS technology used. I apologize for any typographical errors that were not detected and corrected.) Date: 04/01/2023; Time: 12:24 PM  Disclaimer:  Medicine is not an Visual merchandiser. The only guarantee in medicine is that nothing is guaranteed. It is important to note that the decision to proceed with this intervention was based on the information collected from the patient. The Data and conclusions were drawn from the  patient's questionnaire, the interview, and the physical examination. Because the information was provided in large part by the patient, it cannot be guaranteed that it has not been purposely or unconsciously manipulated. Every effort has been made to obtain as much relevant data as possible for this evaluation. It is important to note that the conclusions that lead to this procedure are derived in large part from the available data. Always take into account that the treatment will also be dependent on availability of resources and existing treatment guidelines, considered by other Pain Management Practitioners as being common knowledge and practice, at the time of the intervention. For Medico-Legal purposes, it is also important to point out that variation in procedural techniques and pharmacological choices are the acceptable norm. The indications, contraindications, technique, and results of the above procedure should only be interpreted and judged by a Board-Certified Interventional Pain Specialist with extensive familiarity and expertise in the same exact procedure and technique.

## 2023-04-01 NOTE — Progress Notes (Signed)
PROVIDER NOTE: Interpretation of information contained herein should be left to medically-trained personnel. Specific patient instructions are provided elsewhere under "Patient Instructions" section of medical record. This document was created in part using STT-dictation technology, any transcriptional errors that may result from this process are unintentional.  Patient: Peter Webster Type: Established DOB: 01-Oct-1940 MRN: [id number] PCP: [facility]  Service: Procedure DOS: 04/01/2023 Setting: Ambulatory Location: Ambulatory outpatient facility Delivery: Face-to-face Provider: Edward Jolly, MD Specialty: Interventional Pain Management Specialty designation: 09 Location: Outpatient facility Ref. Prov.: [facility]       Interventional Therapy   Type:  Hyalgan Intra-articular Knee Injection #2  Laterality: Bilateral (-50) Level/approach: Medial Imaging guidance: None required ([id number]) Anesthesia: Local anesthesia (1-2% Lidocaine) DOS: 04/01/2023  Performed by: Edward Jolly, MD  Purpose: Diagnostic/Therapeutic Indications: Knee arthralgia associated to osteoarthritis of the knee 1. Bilateral chronic knee pain   2. Bilateral primary osteoarthritis of knee   3. Lumbar facet arthropathy   4. Chronic radicular lumbar pain   5. Chronic pain syndrome    NAS-11 score:   Pre-procedure: 8 /10   Post-procedure: 5 /10     Pre-Procedure Preparation  Monitoring: As per clinic protocol.  Risk Assessment: Vitals:  [id number] body mass index is 30.68 kg/m as calculated from the following:   Height as of this encounter: 5\' 11"  (1.803 m).   Weight as of this encounter: 220 lb (99.8 kg)., Rate:(!) 114ECG Heart Rate: (!) 117, BP:(!) 132/106, Resp:18, Temp:98.6 F (37 C), SpO2:100 %  Allergies: He is allergic to lisinopril.  Precautions: No additional precautions required  Blood-thinner(s): None at this time  Coagulopathies: Reviewed. None identified.   Active  Infection(s): Reviewed. None identified. Peter Webster is afebrile   Location setting: Exam room Position: Sitting w/ knee bent 90 degrees Safety Precautions: Patient was assessed for positional comfort and pressure points before starting the procedure. Prepping solution: DuraPrep (Iodine Povacrylex [0.7% available iodine] and Isopropyl Alcohol, 74% w/w) Prep Area: Entire knee region Approach: percutaneous, just above the tibial plateau, lateral to the infrapatellar tendon. Intended target: Intra-articular knee space Materials: Tray: Block Needle(s): Regular Qty: 1/side Length: 1.5-inch Gauge: 25G (x1) + 22G (x1)  Meds ordered this encounter  Medications   iohexol (OMNIPAQUE) 180 MG/ML injection 10 mL    Must be Myelogram-compatible. If not available, you may substitute with a water-soluble, non-ionic, hypoallergenic, myelogram-compatible radiological contrast medium.   lidocaine (XYLOCAINE) 2 % (with pres) injection 400 mg   diazepam (VALIUM) tablet 10 mg    Make sure Flumazenil is available in the pyxis when using this medication. If oversedation occurs, administer 0.2 mg IV over 15 sec. If after 45 sec no response, administer 0.2 mg again over 1 min; may repeat at 1 min intervals; not to exceed 4 doses (1 mg)   dexamethasone (DECADRON) injection 10 mg   dexamethasone (DECADRON) injection 10 mg   Sodium Hyaluronate (Viscosup) SOSY 20 mg    Do not substitute. Deliver to facility day before procedure.   Sodium Hyaluronate (Viscosup) SOSY 20 mg    Do not substitute. Deliver to facility day before procedure.   ropivacaine (PF) 2 mg/mL (0.2%) (NAROPIN) injection 9 mL   ropivacaine (PF) 2 mg/mL (0.2%) (NAROPIN) injection 9 mL   sodium chloride flush (NS) 0.9 % injection 2 mL   dexamethasone (DECADRON) injection 10 mg    Orders Placed This Encounter  Procedures   DG PAIN CLINIC C-ARM 1-60 MIN NO REPORT    Intraoperative interpretation by procedural physician at [facility].     Standing Status:   Standing    Number of Occurrences:   1    Order Specific Question:   Reason for exam:    Answer:   Assistance in needle guidance and placement for procedures requiring needle placement in or near specific anatomical locations not easily accessible without such assistance.     Time-out: 1201 I initiated and conducted the "Time-out" before starting the procedure, as per protocol. The patient was asked to participate by confirming the accuracy of the "Time Out" information. Verification of the correct person, site, and procedure were performed and confirmed by me, the nursing staff, and the patient. "Time-out" conducted as per Joint Commission's Universal Protocol (UP.01.01.01). Procedure checklist: Completed  H&P (Pre-op  Assessment)  Peter Webster is a 82 y.o. (year old), male patient, seen today for interventional treatment. He  has a past surgical history that includes Colonoscopy (2019); bilateral hernia (2018); XI robotic assisted ventral hernia (N/A, 12/18/2021); and Insertion of mesh (12/18/2021). Peter Webster has a current medication list which includes the following prescription(s): acetaminophen, acyclovir, amlodipine, calcium carb-cholecalciferol, carbidopa-levodopa, cetirizine, clobetasol ointment, dss, abdominal binder/elastic xl, fluoxetine, hydrocortisone, hydrocortisone, ibuprofen, lenalidomide, lidocaine, omeprazole, rosuvastatin, sildenafil, simvastatin, tizanidine, tramadol, triamcinolone ointment, urea, and gabapentin, and the following Facility-Administered Medications: ropivacaine (pf) 2 mg/ml (0.2%). His primarily concern today is the Back Pain and Knee Pain (bilateral)  He is allergic to lisinopril.   Last encounter: My last encounter with him was on 11/24/2022. 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 Pain Assessment: Severity of Chronic pain is reported as a 8 /10. Location: Knee  /radiates to hips. Onset: More than a month ago. Quality: Throbbing, Aching, Sharp. Timing: Intermittent. Modifying factor(s): tylenol. Vitals:  height is 5\' 11"  (1.803 m) and weight is 220 lb (99.8 kg). His temperature is 98.6 F (37 C). His blood pressure is 153/112 (abnormal) and his pulse is 114 (abnormal). His respiration is 18 and oxygen saturation is 100%.   Reason for encounter: "interventional pain management therapy due pain of at least four (4) weeks in duration, with failure to respond and/or inability to tolerate more conservative care.  Site Confirmation: Mr. Wencl was asked to confirm the procedure and laterality before marking the site.  Consent: Before the procedure and under the influence of no sedative(s), amnesic(s), or anxiolytics, the patient was informed of the treatment options, risks and possible complications. To fulfill our ethical and legal obligations, as recommended by the American Medical Association's Code of Ethics, I have informed the patient of my clinical impression; the nature and purpose of the treatment or procedure; the risks, benefits, and possible complications of the intervention; the alternatives, including doing nothing; the risk(s) and benefit(s) of the alternative treatment(s) or procedure(s); and the risk(s) and benefit(s) of doing nothing. The patient was provided information about the general risks and possible complications associated with the procedure. These may include, but are not limited to: failure to achieve desired goals, infection, bleeding, organ or nerve damage, allergic reactions, paralysis, and death. In addition, the patient was  informed of those risks and complications associated to Spine-related procedures, such as failure to decrease pain; infection (i.e.: Meningitis, epidural or intraspinal abscess); bleeding (i.e.: epidural  hematoma, subarachnoid hemorrhage, or any other type of intraspinal or peri-dural bleeding); organ or nerve damage (i.e.: Any type of peripheral nerve, nerve root, or spinal cord injury) with subsequent damage to sensory, motor, and/or autonomic systems, resulting in permanent pain, numbness, and/or weakness of one or several areas of the body; allergic reactions; (i.e.: anaphylactic reaction); and/or death. Furthermore, the patient was informed of those risks and complications associated with the medications. These include, but are not limited to: allergic reactions (i.e.: anaphylactic or anaphylactoid reaction(s)); adrenal axis suppression; blood sugar elevation that in diabetics may result in ketoacidosis or comma; water retention that in patients with history of congestive heart failure may result in shortness of breath, pulmonary edema, and decompensation with resultant heart failure; weight gain; swelling or edema; medication-induced neural toxicity; particulate matter embolism and blood vessel occlusion with resultant organ, and/or nervous system infarction; and/or aseptic necrosis of one or more joints. Finally, the patient was informed that Medicine is not an exact science; therefore, there is also the possibility of unforeseen or unpredictable risks and/or possible complications that may result in a catastrophic outcome. The patient indicated having understood very clearly. We have given the patient no guarantees and we have made no promises. Enough time was given to the patient to ask questions, all of which were answered to the patient's satisfaction. Mr. Smutny has indicated that he wanted to continue with the procedure. Attestation: I, the ordering provider, attest that I have discussed with the  patient the benefits, risks, side-effects, alternatives, likelihood of achieving goals, and potential problems during recovery for the procedure that I have provided informed consent.  Date  Time: 04/01/2023 11:22 AM  Description of procedure  Start Time: 1217 hrs  Local Anesthesia: Once the patient was positioned, prepped, and time-out was completed. The target area was identified located. The skin was marked with an approved surgical skin marker. Once marked, the skin (epidermis, dermis, and hypodermis), and deeper tissues (fat, connective tissue and muscle) were infiltrated with a small amount of a short-acting local anesthetic, loaded on a 10cc syringe with a 25G, 1.5-in  Needle. An appropriate amount of time was allowed for local anesthetics to take effect before proceeding to the next step. Local Anesthetic: Lidocaine 1-2% The unused portion of the local anesthetic was discarded in the proper designated containers. Safety Precautions: Aspiration looking for blood return was conducted prior to all injections. At no point did I inject any substances, as a needle was being advanced. Before injecting, the patient was told to immediately notify me if he was experiencing any new onset of "ringing in the ears, or metallic taste in the mouth". No attempts were made at seeking any paresthesias. Safe injection practices and needle disposal techniques used. Medications properly checked for expiration dates. SDV (single dose vial) medications used. After the completion of the procedure, all disposable equipment used was discarded in the proper designated medical waste containers.  Technical description: Protocol guidelines were followed. After positioning, the target area was identified and prepped in the usual manner. Skin & deeper tissues infiltrated with local anesthetic. Appropriate amount of time allowed to pass for local anesthetics to take effect. Proper needle placement secured. Once satisfactory  needle placement was confirmed, I proceeded to inject the desired solution in slow, incremental fashion, intermittently assessing for discomfort or any signs of abnormal or undesired spread of substance. Once completed, the needle was removed and disposed of, as per hospital protocols. The area was cleaned, making sure to leave some of  the prepping solution back to take advantage of its long term bactericidal properties.  Aspiration:  Negative        Vitals:   04/01/23 1205 04/01/23 1210 04/01/23 1215 04/01/23 1221  BP: (!) 153/97 (!) 134/100 (!) 147/104 (!) 153/112  Pulse:      Resp: (!) 26 (!) 26 (!) 24 18  Temp:      SpO2: 99% 98% 95% 100%  Weight:      Height:        End Time: 1220 hrs  Imaging guidance  Imaging-assisted Technique: None required. Indication(s): N/A Exposure Time: N/A Contrast: None Fluoroscopic Guidance: N/A Ultrasound Guidance: N/A Interpretation: N/A  Post-op assessment  Post-procedure Vital Signs:  Pulse/HCG Rate: (!) 114(!) 113 Temp: 98.6 F (37 C) Resp: 18 BP: (!) 153/112 SpO2: 100 %  EBL: None  Complications: No immediate post-treatment complications observed by team, or reported by patient.  Note: The patient tolerated the entire procedure well. A repeat set of vitals were taken after the procedure and the patient was kept under observation following institutional policy, for this type of procedure. Post-procedural neurological assessment was performed, showing return to baseline, prior to discharge. The patient was provided with post-procedure discharge instructions, including a section on how to identify potential problems. Should any problems arise concerning this procedure, the patient was given instructions to immediately contact us, at any time, without hesitation. In any case, we plan to contact the patient by telephone for a follow-up status report regarding this interventional procedure.  Comments:  No additional relevant  information.  Plan of care    Medications administered: We administered iohexol, lidocaine, diazepam, dexamethasone, dexamethasone, Sodium Hyaluronate (Viscosup), Sodium Hyaluronate (Viscosup), ropivacaine (PF) 2 mg/mL (0.2%), sodium chloride flush, and dexamethasone.  Follow-up plan:   Return in about 4 weeks (around 04/29/2023) for PPE, F2F.      B/L Hyalgan 12/24/22, 04/01/2023.  Left L4-L5 ESI and bilateral L3, L4, L5 facet medial branch nerve block 04/01/2023   Recent Visits Date Type Provider Dept  03/17/23 Office Visit Edward Jolly, MD [facility]  Showing recent visits within past 90 days and meeting all other requirements Today's Visits Date Type Provider Dept  04/01/23 Procedure visit Edward Jolly, MD [facility]  Showing today's visits and meeting all other requirements Future Appointments Date Type Provider Dept  04/29/23 Appointment Edward Jolly, MD [facility]  Showing future appointments within next 90 days and meeting all other requirements   Disposition: Discharge home  Discharge (Date  Time): 04/01/2023; 1229 hrs.   Primary Care Physician: [facility] Location: [facility] Note by: Edward Jolly, MD Date: 04/01/2023; Time: 1:33 PM  DISCLAIMER: Medicine is not an Visual merchandiser. It has no guarantees or warranties. The decision to proceed with this intervention was based on the information collected from the patient. Conclusions were drawn from the patient's questionnaire, interview, and examination. Because information was provided in large part by the patient, it cannot be guaranteed that it has not been purposely or unconsciously manipulated or altered. Every effort has been made to obtain as much accurate, relevant, available data as possible. Always take into account that the treatment will also be dependent on availability of resources and existing treatment guidelines, considered by other  Pain Management Specialists as being common knowledge and practice, at the time of the intervention. It is also important to point out that variation in procedural techniques and pharmacological choices are the acceptable norm. For Medico-Legal review purposes,  the indications, contraindications, technique, and results of the these procedures should only be evaluated, judged and interpreted by a Board-Certified Interventional Pain Specialist with extensive familiarity and expertise in the same exact procedure and technique.

## 2023-04-02 ENCOUNTER — Telehealth: Payer: Self-pay | Admitting: *Deleted

## 2023-04-02 NOTE — Telephone Encounter (Signed)
No problems post procedure. 

## 2023-04-29 ENCOUNTER — Ambulatory Visit
Payer: No Typology Code available for payment source | Admitting: Student in an Organized Health Care Education/Training Program

## 2023-05-20 ENCOUNTER — Ambulatory Visit
Payer: No Typology Code available for payment source | Admitting: Student in an Organized Health Care Education/Training Program

## 2023-08-06 ENCOUNTER — Ambulatory Visit: Payer: No Typology Code available for payment source | Admitting: Urology

## 2023-08-06 VITALS — BP 132/85 | HR 102 | Ht 71.0 in | Wt 216.0 lb

## 2023-08-06 DIAGNOSIS — C679 Malignant neoplasm of bladder, unspecified: Secondary | ICD-10-CM | POA: Diagnosis not present

## 2023-08-06 NOTE — Patient Instructions (Signed)

## 2023-08-06 NOTE — Progress Notes (Signed)
   08/06/23 3:38 PM   Peter Webster 07/31/1940 [id number]  CC: History of bladder cancer  HPI: 83 year old male with history of HG NMIBC diagnosed in 2021 at the [place] who was referred for BCG.  Apparently he continues with surveillance at the [place], and most recent surveillance cystoscopy in December 2024 was benign.  They no longer have access to BCG reportedly and he was referred to [place] for a maintenance BCG session before resuming routine cystoscopic surveillance at the [place].  He denies any gross hematuria.  He overall is tolerated BCG well, had 1 episode of leg weakness that resolved with Tylenol.   PMH: Past Medical History:  Diagnosis Date   Acute kidney failure (HCC)    due to chemo   Allergy    Arthritis    Bladder cancer (HCC)    Chronic pain syndrome    DDD (degenerative disc disease), lumbar    ED (erectile dysfunction)    Elevated PSA    GERD (gastroesophageal reflux disease)    Headache    due to sinuses   Hernia of anterior abdominal wall    Hyperlipidemia    Hypertension    Multiple myeloma not having achieved remission (HCC)    Right inguinal hernia    Sleep apnea    uses CPAP   Thrombocytopenia (HCC)    Wedge compression fracture of t11-T12 vertebra, sequela     Surgical History: Past Surgical History:  Procedure Laterality Date   bilateral hernia  2018   COLONOSCOPY  2019   INSERTION OF MESH  12/18/2021   Procedure: INSERTION OF MESH;  Surgeon: Campbell Lerner, MD;  Location: [facility] OR[facility];  Service: General;;   XI ROBOTIC ASSISTED VENTRAL HERNIA N/A 12/18/2021   Procedure: XI ROBOTIC ASSISTED VENTRAL HERNIA, incisional;  Surgeon: Campbell Lerner, MD;  Location: [facility];  Service: General;  Laterality: N/A;     Family History: Family History  Problem Relation Age of Onset   Stroke Mother    Cervical cancer Mother     Social History:  reports that he has never smoked. He has never used smokeless tobacco. He reports current alcohol use of about 1.0  standard drink of alcohol per week. He reports that he does not use drugs.  Physical Exam: BP 132/85 (BP Location: Left Arm, Patient Position: Sitting, Cuff Size: Large)   Pulse (!) 102   Ht 5\' 11"  (1.803 m)   Wt 216 lb (98 kg)   BMI 30.13 kg/m    Constitutional:  Alert and oriented, No acute distress. Cardiovascular: No clubbing, cyanosis, or edema. Respiratory: Normal respiratory effort, no increased work of breathing. GI: Abdomen is soft, nontender, nondistended, no abdominal masses   Assessment & Plan:   83 year old male with high-grade nonmuscle invasive bladder cancer originally diagnosed at the [place] in 2021, those records were reviewed in the media tab.  He is referred for a single maintenance BCG session for completion of maintenance BCG, and he will continue his regular surveillance cystoscopy follow-up at the [place], and is already scheduled for March 2025.   Schedule maintenance BCG x 3 sessions, will then return to routine cystoscopy surveillance at the [facility], MD 08/06/2023  [facility] [address], [address] [place], [place] [address] [phone]

## 2023-08-10 ENCOUNTER — Telehealth: Payer: Self-pay | Admitting: *Deleted

## 2023-08-10 NOTE — Telephone Encounter (Addendum)
Called [place] at  [phone] talked with [facility] .   That I have approval # va [id number]   until 02/02/2024 . Our office will be doing BCG on the patient and we are making sure the medication will be covered. ?????   Marcine Matar will be sending a email Personnel officer ) That I needed to talk to about the medication approval or not. Lean will be calling me back today .      Talked with Leasn today at 11:47am , She states the medication is covered.

## 2023-08-26 ENCOUNTER — Telehealth: Payer: Self-pay | Admitting: *Deleted

## 2023-08-26 ENCOUNTER — Encounter: Payer: Self-pay | Admitting: Student in an Organized Health Care Education/Training Program

## 2023-08-26 NOTE — Telephone Encounter (Signed)
Attempted to call for pre VV questions. Message left.

## 2023-08-27 ENCOUNTER — Ambulatory Visit
Payer: No Typology Code available for payment source | Attending: Student in an Organized Health Care Education/Training Program | Admitting: Student in an Organized Health Care Education/Training Program

## 2023-08-27 DIAGNOSIS — M47816 Spondylosis without myelopathy or radiculopathy, lumbar region: Secondary | ICD-10-CM

## 2023-08-27 DIAGNOSIS — M170 Bilateral primary osteoarthritis of knee: Secondary | ICD-10-CM

## 2023-08-27 DIAGNOSIS — G894 Chronic pain syndrome: Secondary | ICD-10-CM | POA: Diagnosis not present

## 2023-08-27 DIAGNOSIS — G8929 Other chronic pain: Secondary | ICD-10-CM

## 2023-08-27 MED ORDER — TRAMADOL HCL 50 MG PO TABS: 50.0000 mg | ORAL_TABLET | Freq: Two times a day (BID) | ORAL | 2 refills | Status: DC | PRN

## 2023-08-27 NOTE — Progress Notes (Signed)
Patient: Peter Webster  Service Category: E/M  Provider: Edward Jolly, MD  DOB: [birth year]  DOS: 08/27/2023  Location: Office  MRN: [id number]  Setting: Ambulatory outpatient  Referring Provider: Margorie John, MD  Type: Established Patient  Specialty: Interventional Pain Management  PCP: [facility]  Location: Remote location  Delivery: TeleHealth     Virtual Encounter - Pain Management PROVIDER NOTE: Information contained herein reflects review and annotations entered in association with encounter. Interpretation of such information and data should be left to medically-trained personnel. Information provided to patient can be located elsewhere in the medical record under "Patient Instructions". Document created using STT-dictation technology, any transcriptional errors that may result from process are unintentional.    Contact & Pharmacy Preferred: [phone] Home: [phone] (home) Mobile: [phone] (mobile) E-mail: [email or web address]  [facility] - [place], [place] - [address] [address] Phone: [phone] Fax: [phone]   Pre-screening  Peter Webster offered "in-person" vs "virtual" encounter. He indicated preferring virtual for this encounter.   Reason COVID-19*  Social distancing based on CDC and AMA recommendations.   I contacted Peter Webster on 08/27/2023 via telephone.      I clearly identified myself as Edward Jolly, MD. I verified that I was speaking with the correct person using two identifiers (Name: Peter Webster, and date of birth: July 28, 1940).  Consent I sought verbal advanced consent from Peter Webster for virtual visit interactions. I informed Peter Webster of possible security and privacy concerns, risks, and limitations associated with providing "not-in-person" medical evaluation and management services. I also informed Peter Webster of the availability of "in-person" appointments. Finally, I informed him that there would  be a charge for the virtual visit and that he could be  personally, fully or partially, financially responsible for it. Peter Webster expressed understanding and agreed to proceed.   Historic Elements   Peter Webster is a 83 y.o. year old, male patient evaluated today after our last contact on 05/20/2023. 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 He also  has a past surgical history that includes Colonoscopy (2019); bilateral hernia (2018); XI robotic assisted ventral hernia (N/A, 12/18/2021); and Insertion of mesh (12/18/2021). Peter Webster has a current medication list which includes the following prescription(s): acetaminophen, acyclovir, amlodipine, ammonium lactate, aspirin ec, calcium carb-cholecalciferol, carbidopa-levodopa, cetirizine, clobetasol ointment, dss, abdominal binder/elastic xl, famotidine, fluoxetine, gabapentin, hydrocortisone, hydrocortisone, ibuprofen, lenalidomide, lidocaine, losartan, omeprazole, pantoprazole, polyvinyl alcohol, rosuvastatin, sildenafil, simvastatin, tizanidine, triamcinolone ointment, urea, and tramadol. He  reports that he has never smoked. He has never used smokeless tobacco. He reports current alcohol use of about 1.0 standard drink of alcohol per week. He reports that he does not use drugs. Peter Webster is allergic to lisinopril.  BMI: Estimated body mass index is 30.13 kg/m as calculated from the following:   Height as of 08/06/23: 5\' 11"  (1.803 m).   Weight as of 08/06/23: 216 lb (98 kg). Last encounter:  03/17/2023. Last procedure: 04/01/2023.  HPI  Today, he is being contacted for both, medication management and a post-procedure assessment.   Discussed the use of AI scribe software for clinical note transcription with the patient, who  gave verbal consent to proceed.  History of Present Illness   Wacey Zieger is an 83 year old male who presents for follow-up of knee and back pain management.  He previously underwent a nerve block procedure targeting the medial branch nerves at L3, L4, and L5 on both sides on October 25th. He experienced significant pain relief following this treatment.  He also has a history of knee pain, which was managed with a gel injection in September, providing relief. His knee pain is part of his ongoing pain management strategy.  He is currently using tramadol, managed through the [facility] pharmacy.       Post-procedure evaluation    Type:  Hyalgan Intra-articular Knee Injection #2  Laterality: Bilateral (-50) Level/approach: Medial Imaging guidance: None required ([id number]) Anesthesia: Local anesthesia (1-2% Lidocaine) DOS: 04/01/2023  Performed by: Edward Jolly, MD  Purpose: Diagnostic/Therapeutic Indications: Knee arthralgia associated to osteoarthritis of the knee 1. Bilateral chronic knee pain   2. Bilateral primary osteoarthritis of knee   3. Lumbar facet arthropathy   4. Chronic radicular lumbar pain   5. Chronic pain syndrome    NAS-11 score:   Pre-procedure: 8 /10   Post-procedure: 5 /10     Effectiveness:  Initial hour after procedure: 50 %  Subsequent 4-6 hours post-procedure: 80 %  Analgesia past initial 6 hours: 50 %  Ongoing improvement:  Analgesic:  50% for 4 months Function: Back to baseline ROM: Back to baseline   Pharmacotherapy Assessment   Opioid Analgesic:  Tramadol 50 mg BID prn.   Monitoring:  PMP: PDMP reviewed during this encounter.       Pharmacotherapy: No side-effects or adverse reactions reported. Compliance:  No problems identified. Effectiveness: Clinically acceptable. Plan: Refer to "POC". UDS:  Summary  Date Value Ref Range Status  11/20/2021 Note  Final    Comment:    ==================================================================== Compliance Drug Analysis, Ur ==================================================================== Test                             Result       Flag       Units  Drug Present and Declared for Prescription Verification   Acetaminophen                  PRESENT      EXPECTED  Drug Absent but Declared for Prescription Verification   Gabapentin                     Not Detected UNEXPECTED   Tizanidine                     Not Detected UNEXPECTED    Tizanidine, as indicated in the declared medication list, is not    always detected even when used as directed.    Prochlorperazine               Not Detected UNEXPECTED   Lidocaine                      Not Detected UNEXPECTED    Lidocaine, as indicated in the declared medication list, is not    always detected even when used as directed.  ==================================================================== Test                      Result    Flag   Units      Ref Range  Creatinine              168              mg/dL      >=84 ==================================================================== Declared Medications:  The flagging and interpretation on this report are based on the  following declared medications.  Unexpected results may arise from  inaccuracies in the declared medications.   **Note: The testing scope of this panel includes these medications:   Gabapentin (Neurontin)  Prochlorperazine (Compazine)   **Note: The testing scope of this panel does not include small to  moderate amounts of these reported medications:   Acetaminophen (Tylenol)  Lidocaine (Xylocaine)  Tizanidine   **Note: The testing scope of this panel does not include the  following reported medications:   Acyclovir   Amlodipine (Norvasc)  Calcium  Capsaicin  Cetirizine (Zyrtec)  Cholecalciferol  Clobetasol  Cyanocobalamin  Docusate  Hydrocortisone  Omeprazole  Sildenafil (Viagra)  Simvastatin (Zocor)  Topical  Triamcinolone (Kenalog) ==================================================================== For clinical consultation, please call [phone]. ====================================================================    No results found for: "CBDTHCR", "D8THCCBX", "D9THCCBX"   Laboratory Chemistry Profile   Renal Lab Results  Component Value Date   BUN 22 11/20/2021   CREATININE 1.96 (H) 11/20/2021   BCR 11 11/20/2021    Hepatic Lab Results  Component Value Date   AST 18 11/20/2021   ALBUMIN 4.6 11/20/2021   ALKPHOS 82 11/20/2021    Electrolytes Lab Results  Component Value Date   NA 142 11/20/2021   K 3.7 11/20/2021   CL 104 11/20/2021   CALCIUM 10.1 11/20/2021   MG 2.0 11/20/2021    Bone Lab Results  Component Value Date   25OHVITD1 38 11/20/2021   25OHVITD2 1.0 11/20/2021   25OHVITD3 37 11/20/2021    Inflammation (CRP: Acute Phase) (ESR: Chronic Phase) No results found for: "CRP", "ESRSEDRATE", "LATICACIDVEN"       Note: Above Lab results reviewed.   Assessment  The primary encounter diagnosis was Bilateral chronic knee pain. Diagnoses of Bilateral primary osteoarthritis of knee, Lumbar facet arthropathy, and Chronic pain syndrome were also pertinent to this visit.  Plan of Care  Assessment and Plan    Chronic Knee Pain Chronic knee pain has been effectively managed with gel injections, with the last injection in September providing a good response. Recommend repeating the gel injection. Order gel injection for knee pain.  Chronic Low Back Pain due to Lumbar Facet Arthropathy Chronic low back pain has been managed with medial branch nerve blocks at L3, L4, and L5 bilaterally. The last nerve block on October 25th, ,2023 was effective- 90% pain relief for  >1 yr. Recommend repeating the nerve block. Order medial branch nerve blocks at L3, L4, and L5 bilaterally.  Pain Management Ongoing pain management is necessary. Tramadol has been prescribed. Send tramadol prescription to [facility] pharmacy.  Follow-up Schedule a follow-up appointment next month for gel injection and nerve blocks.        Mr. Sirr Kabel has a current medication list which includes the following long-term medication(s): amlodipine, calcium carb-cholecalciferol, carbidopa-levodopa, cetirizine, famotidine, fluoxetine, gabapentin, losartan, omeprazole, pantoprazole, rosuvastatin, sildenafil, and simvastatin.  Pharmacotherapy (Medications Ordered): Meds ordered this encounter  Medications   traMADol (ULTRAM) 50 MG tablet    Sig: Take 1 tablet (50 mg total) by mouth every 12 (twelve) hours as needed.    Dispense:  60 tablet    Refill:  2   Orders:  Orders Placed This Encounter  Procedures   LUMBAR FACET(MEDIAL  BRANCH NERVE BLOCK) MBNB    Standing Status:   Future    Expiration Date:   11/24/2023    Scheduling Instructions:     Procedure: Lumbar facet block (AKA.: Lumbosacral medial branch nerve block)     Side: Bilateral     Level:  L3-4, L4-5, and L5-S1 Facets (L3, L4, L5, aMedial Branch)     Sedation: without     Timeframe: ASAA    Where will this procedure be performed?:   [facility]   KNEE INJECTION    Indications: Knee arthralgia (pain) due to osteoarthritis (OA) Imaging: None ([id number]) Position: Sitting Equipment/Materials: Block tray  1.5", 25-G (one per side)  Local anesthetic  Hyalgan (one per side)    Standing Status:   Future    Expected Date:   09/24/2023    Expiration Date:   08/26/2024    Scheduling Instructions:     Procedure: Knee injection Hyalgan (Hyaluronan/Hyaluronic acid)     Treatment No.: 3           Level: Intra-articular     Laterality: Bilateral Knee     Sedation: Patient's choice.    Where will this procedure be performed?:    [facility]   Follow-up plan:   Return in about 25 days (around 09/21/2023).      B/L Hyalgan 12/24/22, 04/01/2023.  Left L4-L5 ESI and bilateral L3, L4, L5 facet medial branch nerve block 04/01/2023    Recent Visits No visits were found meeting these conditions. Showing recent visits within past 90 days and meeting all other requirements Today's Visits Date Type Provider Dept  08/27/23 Office Visit Edward Jolly, MD [facility]  Showing today's visits and meeting all other requirements Future Appointments No visits were found meeting these conditions. Showing future appointments within next 90 days and meeting all other requirements  I discussed the assessment and treatment plan with the patient. The patient was provided an opportunity to ask questions and all were answered. The patient agreed with the plan and demonstrated an understanding of the instructions.  Patient advised to call back or seek an in-person evaluation if the symptoms or condition worsens.  Duration of encounter: .  Note by: Edward Jolly, MD Date: 08/27/2023; Time: 12:40 PM

## 2023-08-28 ENCOUNTER — Ambulatory Visit: Payer: No Typology Code available for payment source | Admitting: Physician Assistant

## 2023-08-28 VITALS — BP 109/69 | HR 98 | Ht 71.0 in | Wt <= 1120 oz

## 2023-08-28 DIAGNOSIS — C679 Malignant neoplasm of bladder, unspecified: Secondary | ICD-10-CM | POA: Diagnosis not present

## 2023-08-28 LAB — MICROSCOPIC EXAMINATION: Bacteria, UA: NONE SEEN

## 2023-08-28 LAB — URINALYSIS, COMPLETE
Bilirubin, UA: NEGATIVE
Glucose, UA: NEGATIVE
Ketones, UA: NEGATIVE
Nitrite, UA: NEGATIVE
Specific Gravity, UA: 1.02 (ref 1.005–1.030)
Urobilinogen, Ur: 0.2 mg/dL (ref 0.2–1.0)
pH, UA: 6 (ref 5.0–7.5)

## 2023-08-28 MED ORDER — BCG LIVE 50 MG IS SUSR
3.2400 mL | Freq: Once | INTRAVESICAL | Status: AC
Start: 2023-08-28 — End: 2023-08-28
  Administered 2023-08-28: 81 mg via INTRAVESICAL

## 2023-08-28 NOTE — Progress Notes (Signed)
BCG Bladder Instillation  BCG # 1 of 3  Due to Bladder Cancer patient is present today for a BCG treatment. Patient was cleaned and prepped in a sterile fashion with betadine. A 14FR catheter was inserted, urine return was noted , urine was yellow in color.  50ml of reconstituted BCG was instilled into the bladder. The catheter was then removed. Patient tolerated well, no complications were noted  Performed by: Carman [name], PA-C and Annabell Sabal, CMA  Follow up: 1 week

## 2023-09-04 ENCOUNTER — Encounter: Payer: Self-pay | Admitting: Physician Assistant

## 2023-09-04 ENCOUNTER — Ambulatory Visit: Payer: No Typology Code available for payment source | Admitting: Physician Assistant

## 2023-09-04 VITALS — BP 115/53 | HR 43 | Ht 71.0 in | Wt 216.0 lb

## 2023-09-04 DIAGNOSIS — C679 Malignant neoplasm of bladder, unspecified: Secondary | ICD-10-CM

## 2023-09-04 LAB — MICROSCOPIC EXAMINATION

## 2023-09-04 LAB — URINALYSIS, COMPLETE
Bilirubin, UA: NEGATIVE
Glucose, UA: NEGATIVE
Ketones, UA: NEGATIVE
Nitrite, UA: NEGATIVE
Specific Gravity, UA: 1.02 (ref 1.005–1.030)
Urobilinogen, Ur: 0.2 mg/dL (ref 0.2–1.0)
pH, UA: 6 (ref 5.0–7.5)

## 2023-09-04 MED ORDER — BCG LIVE 50 MG IS SUSR
3.2400 mL | Freq: Once | INTRAVESICAL | Status: AC
  Administered 2023-09-04: 81 mg via INTRAVESICAL

## 2023-09-04 NOTE — Progress Notes (Signed)
 BCG Bladder Instillation  BCG # 2 of 3  Due to Bladder Cancer patient is present today for a BCG treatment. Patient was cleaned and prepped in a sterile fashion with betadine. A 14FR catheter was inserted, urine return was noted , urine was yellow in color.  50ml of reconstituted BCG was instilled into the bladder. The catheter was then removed. Patient tolerated well, no complications were noted  Performed by: Carman [name], PA-C and Darrol Angel, CMA  Follow up: 1 week

## 2023-09-04 NOTE — Patient Instructions (Signed)
 Your Timeline for Today:  Right now through 12:35pm: Hold your urine and do your quarter turns every 15 minutes. 12:35pm-6:35pm today: Every time you urinate, pour 1/2 cup of bleach into the toilet and let it sit for 15 minutes prior to flushing. 6:35pm onward: Resume your normal routine.

## 2023-09-11 ENCOUNTER — Ambulatory Visit: Payer: No Typology Code available for payment source | Admitting: Physician Assistant

## 2023-09-11 ENCOUNTER — Encounter: Payer: Self-pay | Admitting: Physician Assistant

## 2023-09-11 VITALS — BP 89/56 | HR 50 | Ht 71.0 in | Wt 218.6 lb

## 2023-09-11 DIAGNOSIS — C679 Malignant neoplasm of bladder, unspecified: Secondary | ICD-10-CM | POA: Diagnosis not present

## 2023-09-11 LAB — MICROSCOPIC EXAMINATION: WBC, UA: >30 /HPF — AB (ref 0–5)

## 2023-09-11 LAB — URINALYSIS, COMPLETE
Bilirubin, UA: NEGATIVE
Glucose, UA: NEGATIVE
Ketones, UA: NEGATIVE
Nitrite, UA: NEGATIVE
Specific Gravity, UA: 1.025 (ref 1.005–1.030)
Urobilinogen, Ur: 0.2 mg/dL (ref 0.2–1.0)
pH, UA: 6.5 (ref 5.0–7.5)

## 2023-09-11 MED ORDER — BCG LIVE 50 MG IS SUSR
3.2400 mL | Freq: Once | INTRAVESICAL | Status: AC
  Administered 2023-09-11: 81 mg via INTRAVESICAL

## 2023-09-11 NOTE — Progress Notes (Signed)
 BCG Bladder Instillation  BCG # 3 of 3  Due to Bladder Cancer patient is present today for a BCG treatment. Patient was cleaned and prepped in a sterile fashion with betadine. A 14FR catheter was inserted, urine return was noted 50ml, urine was yellow in color.  50ml of reconstituted BCG was instilled into the bladder. The catheter was then removed. Patient tolerated well, no complications were noted  Performed by: Carman [name], PA-C and Benay Pike, CMA  Follow up: He is scheduled for cysto at the [facility] and will call us when he needs his next round of BCG.

## 2023-09-11 NOTE — Patient Instructions (Signed)
Your Timeline for Today:  Right now through 12:15pm: Hold your urine and do your quarter turns every 15 minutes. 12:15pm-6:15pm today: Every time you urinate, pour 1/2 cup of bleach into the toilet and let it sit for 15 minutes prior to flushing. 6:15pm onward: Resume your normal routine.

## 2023-09-15 LAB — CULTURE, URINE COMPREHENSIVE

## 2023-09-23 ENCOUNTER — Ambulatory Visit
Admission: RE | Admit: 2023-09-23 | Discharge: 2023-09-23 | Disposition: A | Discharge status: Home / Self Care | Source: Ambulatory Visit | Attending: Student in an Organized Health Care Education/Training Program | Admitting: Student in an Organized Health Care Education/Training Program

## 2023-09-23 ENCOUNTER — Encounter: Payer: Self-pay | Admitting: Student in an Organized Health Care Education/Training Program

## 2023-09-23 ENCOUNTER — Ambulatory Visit
Payer: No Typology Code available for payment source | Attending: Student in an Organized Health Care Education/Training Program | Admitting: Student in an Organized Health Care Education/Training Program

## 2023-09-23 VITALS — BP 155/80 | HR 42 | Temp 97.1°F | Resp 17 | Ht 71.0 in | Wt 216.0 lb

## 2023-09-23 DIAGNOSIS — G8929 Other chronic pain: Secondary | ICD-10-CM | POA: Insufficient documentation

## 2023-09-23 DIAGNOSIS — M170 Bilateral primary osteoarthritis of knee: Secondary | ICD-10-CM

## 2023-09-23 DIAGNOSIS — M47816 Spondylosis without myelopathy or radiculopathy, lumbar region: Secondary | ICD-10-CM

## 2023-09-23 DIAGNOSIS — M25561 Pain in right knee: Secondary | ICD-10-CM | POA: Insufficient documentation

## 2023-09-23 DIAGNOSIS — M25562 Pain in left knee: Secondary | ICD-10-CM | POA: Diagnosis present

## 2023-09-23 MED ORDER — SODIUM HYALURONATE (VISCOSUP) 20 MG/2ML IX SOSY
2.0000 mL | PREFILLED_SYRINGE | Freq: Once | INTRA_ARTICULAR | Status: AC
  Administered 2023-09-23: 20 mg via INTRA_ARTICULAR

## 2023-09-23 MED ORDER — LIDOCAINE HCL 2 % IJ SOLN
20.0000 mL | Freq: Once | INTRAMUSCULAR | Status: AC
  Administered 2023-09-23: 400 mg
  Filled 2023-09-23: qty 20

## 2023-09-23 MED ORDER — ROPIVACAINE HCL 2 MG/ML IJ SOLN
18.0000 mL | Freq: Once | INTRAMUSCULAR | Status: AC
  Administered 2023-09-23: 18 mL via PERINEURAL
  Filled 2023-09-23: qty 20

## 2023-09-23 MED ORDER — DEXAMETHASONE SODIUM PHOSPHATE 10 MG/ML IJ SOLN
20.0000 mg | Freq: Once | INTRAMUSCULAR | Status: AC
  Administered 2023-09-23: 20 mg
  Filled 2023-09-23: qty 2

## 2023-09-23 NOTE — Progress Notes (Signed)
 Safety precautions to be maintained throughout the outpatient stay will include: orient to surroundings, keep bed in low position, maintain call bell within reach at all times, provide assistance with transfer out of bed and ambulation.

## 2023-09-23 NOTE — Progress Notes (Signed)
 PROVIDER NOTE: Interpretation of information contained herein should be left to medically-trained personnel. Specific patient instructions are provided elsewhere under "Patient Instructions" section of medical record. This document was created in part using STT-dictation technology, any transcriptional errors that may result from this process are unintentional.  Patient: Peter Webster Type: Established DOB: April 01, 1941 MRN: [id number] PCP: [facility]  Service: Procedure DOS: 09/23/2023 Setting: Ambulatory Location: Ambulatory outpatient facility Delivery: Face-to-face Provider: Edward Jolly, MD Specialty: Interventional Pain Management Specialty designation: 09 Location: Outpatient facility Ref. Prov.: [facility]       Interventional Therapy   Type:  Hyalgan Intra-articular Knee Injection #3  Laterality: Bilateral (-50) Level/approach: Medial Imaging guidance: None required ([id number]) Anesthesia: Local anesthesia (1-2% Lidocaine) DOS: 09/23/2023  Performed by: Edward Jolly, MD  Purpose: Diagnostic/Therapeutic Indications: Knee arthralgia associated to osteoarthritis of the knee 1. Bilateral chronic knee pain   2. Bilateral primary osteoarthritis of knee     NAS-11 score:   Pre-procedure: 8 /10   Post-procedure: 8 /10     Pre-Procedure Preparation  Monitoring: As per clinic protocol.  Risk Assessment: Vitals:  QVZ:DGLOVFIEP body mass index is 30.13 kg/m as calculated from the following:   Height as of this encounter: 5\' 11"  (1.803 m).   Weight as of this encounter: 216 lb (98 kg)., Rate:(!) 42 (Pt states it is normal for him to have low HR due to medication) , BP:(!) 157/67, Resp:16, Temp:(!) 97.1 F (36.2 C), SpO2:100 %  Allergies: He is allergic to lisinopril.  Precautions: No additional precautions required  Blood-thinner(s): None at this time  Coagulopathies: Reviewed. None identified.   Active Infection(s): Reviewed. None identified. [name] is afebrile   Location setting: Exam room Position: Sitting w/ knee bent 90 degrees Safety Precautions: Patient was assessed for positional comfort and pressure points before starting the procedure. Prepping solution: DuraPrep (Iodine Povacrylex [0.7% available iodine] and Isopropyl Alcohol, 74% w/w) Prep Area: Entire knee region Approach: percutaneous, just above the tibial plateau, lateral to the infrapatellar tendon. Intended target: Intra-articular knee space Materials: Tray: Block Needle(s): Regular Qty: 1/side Length: 1.5-inch Gauge: 25G (x1) + 22G (x1)  Meds ordered this encounter  Medications   lidocaine (XYLOCAINE) 2 % (with pres) injection 400 mg   Sodium Hyaluronate (Viscosup) SOSY 20 mg    Do not substitute. Deliver to facility day before procedure.   Sodium Hyaluronate (Viscosup) SOSY 20 mg    Do not substitute. Deliver to facility day before procedure.   ropivacaine (PF) 2 mg/mL (0.2%) (NAROPIN) injection 18 mL   dexamethasone (DECADRON) injection 20 mg    Orders Placed This Encounter  Procedures   DG PAIN CLINIC C-ARM 1-60 MIN NO REPORT    Intraoperative interpretation by procedural physician at [facility].    Standing Status:   Standing    Number of Occurrences:   1    Reason for exam::   Assistance in needle guidance and placement for procedures requiring needle placement in or near specific anatomical locations not easily accessible without such assistance.     Time-out:   I initiated and conducted the "Time-out" before starting the procedure, as per protocol. The patient was asked to participate by confirming the accuracy of the "Time Out" information. Verification of the correct person, site, and procedure were performed and confirmed by me, the nursing staff, and the patient. "Time-out" conducted as per Joint Commission's Universal Protocol (UP.01.01.01). Procedure checklist: Completed  H&P (Pre-op  Assessment)  Peter Webster is a 83 y.o.  (year  old), male patient, seen today for interventional treatment. He  has a past surgical history that includes Colonoscopy (2019); bilateral hernia (2018); XI robotic assisted ventral hernia (N/A, 12/18/2021); and Insertion of mesh (12/18/2021). Peter Webster has a current medication list which includes the following prescription(s): acetaminophen, acyclovir, amlodipine, ammonium lactate, aspirin ec, calcium carb-cholecalciferol, carbidopa-levodopa, cetirizine, clobetasol ointment, dss, abdominal binder/elastic xl, famotidine, fluoxetine, gabapentin, hydrocortisone, lenalidomide, lidocaine, losartan, pantoprazole, polyvinyl alcohol, rosuvastatin, sildenafil, simvastatin, tizanidine, tramadol, triamcinolone ointment, and urea. His primarily concern today is the Back Pain and Knee Pain (Bilateral /)  He is allergic to lisinopril.   Last encounter: My last encounter with him was on 11/24/2022. 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 Pain Assessment: Severity of Chronic pain is reported as a 8 /10. Location: Back Lower, Right, Left/Pain radiates from lower back into hips bilateral to top of thigh jsut above the knee. Seperate pain anterior and outer laternal knee nilateral.. Onset: More than a month ago. Quality: Constant, Penetrating, Aching. Timing: Constant. Modifying factor(s): Tramadol and procedures. Vitals:  height is 5\' 11"  (1.803 m) and weight is 216 lb (98 kg). His temporal temperature is 97.1 F (36.2 C) (abnormal). His blood pressure is 157/67 (abnormal) and his pulse is 42 (abnormal). His respiration is 16  and oxygen saturation is 100%.   Reason for encounter: "interventional pain management therapy due pain of at least four (4) weeks in duration, with failure to respond and/or inability to tolerate more conservative care.  Site Confirmation: Peter Webster was asked to confirm the procedure and laterality before marking the site.  Consent: Before the procedure and under the influence of no sedative(s), amnesic(s), or anxiolytics, the patient was informed of the treatment options, risks and possible complications. To fulfill our ethical and legal obligations, as recommended by the American Medical Association's Code of Ethics, I have informed the patient of my clinical impression; the nature and purpose of the treatment or procedure; the risks, benefits, and possible complications of the intervention; the alternatives, including doing nothing; the risk(s) and benefit(s) of the alternative treatment(s) or procedure(s); and the risk(s) and benefit(s) of doing nothing. The patient was provided information about the general risks and possible complications associated with the procedure. These may include, but are not limited to: failure to achieve desired goals, infection, bleeding, organ or nerve damage, allergic reactions, paralysis, and death. In addition, the patient was informed of those risks and complications associated to Spine-related procedures, such as failure to decrease pain; infection (i.e.: Meningitis, epidural or intraspinal abscess); bleeding (i.e.: epidural hematoma, subarachnoid hemorrhage, or any other type of intraspinal or peri-dural bleeding); organ or nerve damage (i.e.: Any type of peripheral nerve, nerve root, or spinal cord injury) with subsequent damage to sensory, motor, and/or autonomic systems, resulting in permanent pain, numbness, and/or weakness of one or several areas of the body; allergic reactions; (i.e.: anaphylactic reaction); and/or death. Furthermore, the patient was informed  of those risks and complications associated with the medications. These include, but are not limited to: allergic reactions (i.e.: anaphylactic or anaphylactoid reaction(s)); adrenal axis suppression; blood sugar elevation that in diabetics may result in ketoacidosis or comma; water retention that in patients with history of congestive heart failure may result  in shortness of breath, pulmonary edema, and decompensation with resultant heart failure; weight gain; swelling or edema; medication-induced neural toxicity; particulate matter embolism and blood vessel occlusion with resultant organ, and/or nervous system infarction; and/or aseptic necrosis of one or more joints. Finally, the patient was informed that Medicine is not an exact science; therefore, there is also the possibility of unforeseen or unpredictable risks and/or possible complications that may result in a catastrophic outcome. The patient indicated having understood very clearly. We have given the patient no guarantees and we have made no promises. Enough time was given to the patient to ask questions, all of which were answered to the patient's satisfaction. Peter Webster has indicated that he wanted to continue with the procedure. Attestation: I, the ordering provider, attest that I have discussed with the patient the benefits, risks, side-effects, alternatives, likelihood of achieving goals, and potential problems during recovery for the procedure that I have provided informed consent.  Date  Time: 09/23/2023 10:01 AM  Description of procedure  Start Time:   hrs  Local Anesthesia: Once the patient was positioned, prepped, and time-out was completed. The target area was identified located. The skin was marked with an approved surgical skin marker. Once marked, the skin (epidermis, dermis, and hypodermis), and deeper tissues (fat, connective tissue and muscle) were infiltrated with a small amount of a short-acting local anesthetic, loaded on a  10cc syringe with a 25G, 1.5-in  Needle. An appropriate amount of time was allowed for local anesthetics to take effect before proceeding to the next step. Local Anesthetic: Lidocaine 1-2% The unused portion of the local anesthetic was discarded in the proper designated containers. Safety Precautions: Aspiration looking for blood return was conducted prior to all injections. At no point did I inject any substances, as a needle was being advanced. Before injecting, the patient was told to immediately notify me if he was experiencing any new onset of "ringing in the ears, or metallic taste in the mouth". No attempts were made at seeking any paresthesias. Safe injection practices and needle disposal techniques used. Medications properly checked for expiration dates. SDV (single dose vial) medications used. After the completion of the procedure, all disposable equipment used was discarded in the proper designated medical waste containers.  Technical description: Protocol guidelines were followed. After positioning, the target area was identified and prepped in the usual manner. Skin & deeper tissues infiltrated with local anesthetic. Appropriate amount of time allowed to pass for local anesthetics to take effect. Proper needle placement secured. Once satisfactory needle placement was confirmed, I proceeded to inject the desired solution in slow, incremental fashion, intermittently assessing for discomfort or any signs of abnormal or undesired spread of substance. Once completed, the needle was removed and disposed of, as per hospital protocols. The area was cleaned, making sure to leave some of the prepping solution back to take advantage of its long term bactericidal properties.  Aspiration:  Negative        Vitals:   09/23/23 1007  BP: (!) 157/67  Pulse: (!) 42  Resp: 16  Temp: (!) 97.1 F (36.2 C)  TempSrc: Temporal  SpO2: 100%  Weight: 216 lb (98 kg)  Height: 5\' 11"  (1.803 m)    End Time:    hrs  Imaging guidance  Imaging-assisted Technique: None required. Indication(s): N/A Exposure Time: N/A Contrast: None Fluoroscopic Guidance: N/A Ultrasound Guidance: N/A Interpretation: N/A  Post-op assessment  Post-procedure Vital Signs:  Pulse/HCG Rate: (!) 42 (Pt states it is normal for  him to have low HR due to medication)  Temp: (!) 97.1 F (36.2 C) Resp: 16 BP: (!) 157/67 SpO2: 100 %  EBL: None  Complications: No immediate post-treatment complications observed by team, or reported by patient.  Note: The patient tolerated the entire procedure well. A repeat set of vitals were taken after the procedure and the patient was kept under observation following institutional policy, for this type of procedure. Post-procedural neurological assessment was performed, showing return to baseline, prior to discharge. The patient was provided with post-procedure discharge instructions, including a section on how to identify potential problems. Should any problems arise concerning this procedure, the patient was given instructions to immediately contact us, at any time, without hesitation. In any case, we plan to contact the patient by telephone for a follow-up status report regarding this interventional procedure.  Comments:  No additional relevant information.  Plan of care    Medications administered: We administered lidocaine, Sodium Hyaluronate (Viscosup), Sodium Hyaluronate (Viscosup), ropivacaine (PF) 2 mg/mL (0.2%), and dexamethasone.  Follow-up plan:   No follow-ups on file.      B/L Hyalgan 12/24/22, 04/01/2023, 09/23/2023.  Left L4-L5 ESI and bilateral L3, L4, L5 facet medial branch nerve block 04/01/2023   Recent Visits Date Type Provider Dept  08/27/23 Office Visit Edward Jolly, MD [facility]  Showing recent visits within past 90 days and meeting all other requirements Today's Visits Date Type Provider Dept  09/23/23 Procedure visit Edward Jolly, MD [facility]  Showing today's visits and meeting all other requirements Future Appointments No visits were found meeting these conditions. Showing future appointments within next 90 days and meeting all other requirements   Disposition: Discharge home  Discharge (Date  Time): 09/23/2023;   hrs.   Primary Care Physician: [facility] Location: [facility] Note by: Edward Jolly, MD Date: 09/23/2023; Time: 10:51 AM  DISCLAIMER: Medicine is not an exact science. It has no guarantees or warranties. The decision to proceed with this intervention was based on the information collected from the patient. Conclusions were drawn from the patient's questionnaire, interview, and examination. Because information was provided in large part by the patient, it cannot be guaranteed that it has not been purposely or unconsciously manipulated or altered. Every effort has been made to obtain as much accurate, relevant, available data as possible. Always take into account that the treatment will also be dependent on availability of resources and existing treatment guidelines, considered by other Pain Management Specialists as being common knowledge and practice, at the time of the intervention. It is also important to point out that variation in procedural techniques and pharmacological choices are the acceptable norm. For Medico-Legal review purposes, the indications, contraindications, technique, and results of the these procedures should only be evaluated, judged and interpreted by a Board-Certified Interventional Pain Specialist with extensive familiarity and expertise in the same exact procedure and technique.

## 2023-09-23 NOTE — Progress Notes (Signed)
 PROVIDER NOTE: Interpretation of information contained herein should be left to medically-trained personnel. Specific patient instructions are provided elsewhere under "Patient Instructions" section of medical record. This document was created in part using STT-dictation technology, any transcriptional errors that may result from this process are unintentional.  Patient: Peter Webster Type: Established DOB: 09-30-1940 MRN: [id number] PCP: [facility]  Service: Procedure DOS: 09/23/2023 Setting: Ambulatory Location: Ambulatory outpatient facility Delivery: Face-to-face Provider: Edward Jolly, MD Specialty: Interventional Pain Management Specialty designation: 09 Location: Outpatient facility Ref. Prov.: [facility]       Interventional Therapy   Procedure: Lumbar Facet, Medial Branch Block(s) #2  Laterality: Bilateral  Level: L3, L4, and L5 Medial Branch Level(s). Injecting these levels blocks the L3-4 and L4-5 lumbar facet joints. Imaging: Fluoroscopic guidance         Anesthesia: Local anesthesia (1-2% Lidocaine) DOS: 09/23/2023 Performed by: Edward Jolly, MD  Primary Purpose: Diagnostic/Therapeutic Indications: Low back pain severe enough to impact quality of life or function. Lumbar facet arthropathy NAS-11 Pain score:   Pre-procedure: 8 /10   Post-procedure: 0-No pain/10     Position / Prep / Materials:  Position: Prone  Prep solution: DuraPrep (Iodine Povacrylex [0.7% available iodine] and Isopropyl Alcohol, 74% w/w) Area Prepped: Posterolateral Lumbosacral Spine (Wide prep: From the lower border of the scapula down to the end of the tailbone and from flank to flank.)  Materials:  Tray: Block Needle(s):  Type: Spinal  Gauge (G): 22  Length: 3.5-in Qty: 2      H&P (Pre-op Assessment):  Peter Webster is a 83 y.o. (year old), male patient, seen today for interventional treatment. He  has a past surgical history that includes Colonoscopy (2019);  bilateral hernia (2018); XI robotic assisted ventral hernia (N/A, 12/18/2021); and Insertion of mesh (12/18/2021). Peter Webster has a current medication list which includes the following prescription(s): acetaminophen, acyclovir, amlodipine, ammonium lactate, aspirin ec, calcium carb-cholecalciferol, carbidopa-levodopa, cetirizine, clobetasol ointment, dss, abdominal binder/elastic xl, famotidine, fluoxetine, gabapentin, hydrocortisone, lenalidomide, lidocaine, losartan, pantoprazole, polyvinyl alcohol, rosuvastatin, sildenafil, simvastatin, tizanidine, tramadol, triamcinolone ointment, and urea. His primarily concern today is the Back Pain and Knee Pain (Bilateral /)  Initial Vital Signs:  Pulse/HCG Rate: (!) 42 (Pt states it is normal for him to have low HR due to medication)ECG Heart Rate: (!) 48 Temp: (!) 97.1 F (36.2 C) Resp: 16 BP: (!) 157/67 SpO2: 100 %  BMI: Estimated body mass index is 30.13 kg/m as calculated from the following:   Height as of this encounter: 5\' 11"  (1.803 m).   Weight as of this encounter: 216 lb (98 kg).  Risk Assessment: Allergies: Reviewed. He is allergic to lisinopril.  Allergy Precautions: None required Coagulopathies: Reviewed. None identified.  Blood-thinner therapy: None at this time Active Infection(s): Reviewed. None identified. Peter Webster is afebrile  Site Confirmation: Peter Webster was asked to confirm the procedure and laterality before marking the site Procedure checklist: Completed Consent: Before the procedure and under the influence of no sedative(s), amnesic(s), or anxiolytics, the patient was informed of the treatment options, risks and possible complications. To fulfill our ethical and legal obligations, as recommended by the American Medical Association's Code of Ethics, I have informed the patient of my clinical impression; the nature and purpose of the treatment or procedure; the risks, benefits, and possible complications of the intervention; the  alternatives, including doing nothing; the risk(s) and benefit(s) of the alternative treatment(s) or procedure(s); and the risk(s) and benefit(s) of doing nothing. The patient was provided information about  the general risks and possible complications associated with the procedure. These may include, but are not limited to: failure to achieve desired goals, infection, bleeding, organ or nerve damage, allergic reactions, paralysis, and death. In addition, the patient was informed of those risks and complications associated to Spine-related procedures, such as failure to decrease pain; infection (i.e.: Meningitis, epidural or intraspinal abscess); bleeding (i.e.: epidural hematoma, subarachnoid hemorrhage, or any other type of intraspinal or peri-dural bleeding); organ or nerve damage (i.e.: Any type of peripheral nerve, nerve root, or spinal cord injury) with subsequent damage to sensory, motor, and/or autonomic systems, resulting in permanent pain, numbness, and/or weakness of one or several areas of the body; allergic reactions; (i.e.: anaphylactic reaction); and/or death. Furthermore, the patient was informed of those risks and complications associated with the medications. These include, but are not limited to: allergic reactions (i.e.: anaphylactic or anaphylactoid reaction(s)); adrenal axis suppression; blood sugar elevation that in diabetics may result in ketoacidosis or comma; water retention that in patients with history of congestive heart failure may result in shortness of breath, pulmonary edema, and decompensation with resultant heart failure; weight gain; swelling or edema; medication-induced neural toxicity; particulate matter embolism and blood vessel occlusion with resultant organ, and/or nervous system infarction; and/or aseptic necrosis of one or more joints. Finally, the patient was informed that Medicine is not an exact science; therefore, there is also the possibility of unforeseen or  unpredictable risks and/or possible complications that may result in a catastrophic outcome. The patient indicated having understood very clearly. We have given the patient no guarantees and we have made no promises. Enough time was given to the patient to ask questions, all of which were answered to the patient's satisfaction. Mr. Rennert has indicated that he wanted to continue with the procedure. Attestation: I, the ordering provider, attest that I have discussed with the patient the benefits, risks, side-effects, alternatives, likelihood of achieving goals, and potential problems during recovery for the procedure that I have provided informed consent. Date  Time: 09/23/2023 10:01 AM   Pre-Procedure Preparation:  Monitoring: As per clinic protocol. Respiration, ETCO2, SpO2, BP, heart rate and rhythm monitor placed and checked for adequate function Safety Precautions: Patient was assessed for positional comfort and pressure points before starting the procedure. Time-out: I initiated and conducted the "Time-out" before starting the procedure, as per protocol. The patient was asked to participate by confirming the accuracy of the "Time Out" information. Verification of the correct person, site, and procedure were performed and confirmed by me, the nursing staff, and the patient. "Time-out" conducted as per Joint Commission's Universal Protocol (UP.01.01.01). Time: 1125 Start Time: 1125 hrs.  Description of Procedure:          Laterality: (see above) Targeted Levels: (see above)  Safety Precautions: Aspiration looking for blood return was conducted prior to all injections. At no point did we inject any substances, as a needle was being advanced. Before injecting, the patient was told to immediately notify me if he was experiencing any new onset of "ringing in the ears, or metallic taste in the mouth". No attempts were made at seeking any paresthesias. Safe injection practices and needle disposal  techniques used. Medications properly checked for expiration dates. SDV (single dose vial) medications used. After the completion of the procedure, all disposable equipment used was discarded in the proper designated medical waste containers. Local Anesthesia: Protocol guidelines were followed. The patient was positioned over the fluoroscopy table. The area was prepped in the usual manner. The  time-out was completed. The target area was identified using fluoroscopy. A 12-in long, straight, sterile hemostat was used with fluoroscopic guidance to locate the targets for each level blocked. Once located, the skin was marked with an approved surgical skin marker. Once all sites were marked, the skin (epidermis, dermis, and hypodermis), as well as deeper tissues (fat, connective tissue and muscle) were infiltrated with a small amount of a short-acting local anesthetic, loaded on a 10cc syringe with a 25G, 1.5-in  Needle. An appropriate amount of time was allowed for local anesthetics to take effect before proceeding to the next step. Local Anesthetic: Lidocaine 2.0% The unused portion of the local anesthetic was discarded in the proper designated containers. Technical description of process:  L3 Medial Branch Nerve Block (MBB): The target area for the L3 medial branch is at the junction of the postero-lateral aspect of the superior articular process and the superior, posterior, and medial edge of the transverse process of L4. Under fluoroscopic guidance, a Quincke needle was inserted until contact was made with os over the superior postero-lateral aspect of the pedicular shadow (target area). After negative aspiration for blood, 2mL of the nerve block solution was injected without difficulty or complication. The needle was removed intact. L4 Medial Branch Nerve Block (MBB): The target area for the L4 medial branch is at the junction of the postero-lateral aspect of the superior articular process and the superior,  posterior, and medial edge of the transverse process of L5. Under fluoroscopic guidance, a Quincke needle was inserted until contact was made with os over the superior postero-lateral aspect of the pedicular shadow (target area). After negative aspiration for blood, 2mL of the nerve block solution was injected without difficulty or complication. The needle was removed intact. L5 Medial Branch Nerve Block (MBB): The target area for the L5 medial branch is at the junction of the postero-lateral aspect of the superior articular process and the superior, posterior, and medial edge of the sacral ala. Under fluoroscopic guidance, a Quincke needle was inserted until contact was made with os over the superior postero-lateral aspect of the pedicular shadow (target area). After negative aspiration for blood, 2mL of the nerve block solution was injected without difficulty or complication. The needle was removed intact.   Once the entire procedure was completed, the treated area was cleaned, making sure to leave some of the prepping solution back to take advantage of its long term bactericidal properties.         Illustration of the posterior view of the lumbar spine and the posterior neural structures. Laminae of L2 through S1 are labeled. DPRL5, dorsal primary ramus of L5; DPRS1, dorsal primary ramus of S1; DPR3, dorsal primary ramus of L3; FJ, facet (zygapophyseal) joint L3-L4; I, inferior articular process of L4; LB1, lateral branch of dorsal primary ramus of L1; IAB, inferior articular branches from L3 medial branch (supplies L4-L5 facet joint); IBP, intermediate branch plexus; MB3, medial branch of dorsal primary ramus of L3; NR3, third lumbar nerve root; S, superior articular process of L5; SAB, superior articular branches from L4 (supplies L4-5 facet joint also); TP3, transverse process of L3.   Facet Joint Innervation (* possible contribution)  L1-2 T12, L1 (L2*)  Medial Branch  L2-3 L1, L2 (L3*)          "          "  L3-4 L2, L3 (L4*)         "          "  L4-5 L3, L4 (L5*)         "          "  L5-S1 L4, L5, S1          "          "    Vitals:   09/23/23 1120 09/23/23 1125 09/23/23 1130 09/23/23 1133  BP: (!) 164/85 (!) 154/86 (!) 150/81 (!) 155/80  Pulse:      Resp: 19 20 19 17   Temp:      TempSrc:      SpO2: 100% 100% 99% 97%  Weight:      Height:         End Time: 1133 hrs.  Imaging Guidance (Spinal):          Type of Imaging Technique: Fluoroscopy Guidance (Spinal) Indication(s): Assistance in needle guidance and placement for procedures requiring needle placement in or near specific anatomical locations not easily accessible without such assistance. Exposure Time: Please see nurses notes. Contrast: None used. Fluoroscopic Guidance: I was personally present during the use of fluoroscopy. "Tunnel Vision Technique" used to obtain the best possible view of the target area. Parallax error corrected before commencing the procedure. "Direction-depth-direction" technique used to introduce the needle under continuous pulsed fluoroscopy. Once target was reached, antero-posterior, oblique, and lateral fluoroscopic projection used confirm needle placement in all planes. Images permanently stored in EMR. Interpretation: No contrast injected. I personally interpreted the imaging intraoperatively. Adequate needle placement confirmed in multiple planes. Permanent images saved into the patient's record.  Post-operative Assessment:  Post-procedure Vital Signs:  Pulse/HCG Rate: (!) 42 (Pt states it is normal for him to have low HR due to medication)64 Temp: (!) 97.1 F (36.2 C) Resp: 17 BP: (!) 155/80 SpO2: 97 %  EBL: None  Complications: No immediate post-treatment complications observed by team, or reported by patient.  Note: The patient tolerated the entire procedure well. A repeat set of vitals were taken after the procedure and the patient was kept under observation following  institutional policy, for this type of procedure. Post-procedural neurological assessment was performed, showing return to baseline, prior to discharge. The patient was provided with post-procedure discharge instructions, including a section on how to identify potential problems. Should any problems arise concerning this procedure, the patient was given instructions to immediately contact us, at any time, without hesitation. In any case, we plan to contact the patient by telephone for a follow-up status report regarding this interventional procedure.  Comments:  No additional relevant information.  Plan of Care (POC)  Orders:  Orders Placed This Encounter  Procedures   DG PAIN [facility] C-ARM 1-60 MIN NO REPORT    Intraoperative interpretation by procedural physician at [facility].    Standing Status:   Standing    Number of Occurrences:   1    Reason for exam::   Assistance in needle guidance and placement for procedures requiring needle placement in or near specific anatomical locations not easily accessible without such assistance.    Medications ordered for procedure: Meds ordered this encounter  Medications   lidocaine (XYLOCAINE) 2 % (with pres) injection 400 mg   Sodium Hyaluronate (Viscosup) SOSY 20 mg    Do not substitute. Deliver to facility day before procedure.   Sodium Hyaluronate (Viscosup) SOSY 20 mg    Do not substitute. Deliver to facility day before procedure.   ropivacaine (PF) 2 mg/mL (0.2%) (NAROPIN) injection 18 mL   dexamethasone (DECADRON) injection 20 mg  Medications administered: We administered lidocaine, Sodium Hyaluronate (Viscosup), Sodium Hyaluronate (Viscosup), ropivacaine (PF) 2 mg/mL (0.2%), and dexamethasone.  See the medical record for exact dosing, route, and time of administration.  Follow-up plan:   Return in about 6 weeks (around 11/04/2023) for PPE F2F 6 - 7  weeks .       B/L Hyalgan 12/24/22, 04/01/2023, 09/23/23.  Left L4-L5 ESI and  bilateral L3, L4, L5 facet medial branch nerve block 04/01/2023, 09/23/23    Recent Visits Date Type Provider Dept  08/27/23 Office Visit Edward Jolly, MD [facility]  Showing recent visits within past 90 days and meeting all other requirements Today's Visits Date Type Provider Dept  09/23/23 Procedure visit Edward Jolly, MD [facility]  Showing today's visits and meeting all other requirements Future Appointments Date Type Provider Dept  10/28/23 Appointment Edward Jolly, MD [facility]  Showing future appointments within next 90 days and meeting all other requirements  Disposition: Discharge home  Discharge (Date  Time): 09/23/2023; 1144 hrs.   Primary Care Physician: [facility] Location: [facility] Note by: Edward Jolly, MD (TTS technology used. I apologize for any typographical errors that were not detected and corrected.) Date: 09/23/2023; Time: 12:05 PM  Disclaimer:  Medicine is not an Visual merchandiser. The only guarantee in medicine is that nothing is guaranteed. It is important to note that the decision to proceed with this intervention was based on the information collected from the patient. The Data and conclusions were drawn from the patient's questionnaire, the interview, and the physical examination. Because the information was provided in large part by the patient, it cannot be guaranteed that it has not been purposely or unconsciously manipulated. Every effort has been made to obtain as much relevant data as possible for this evaluation. It is important to note that the conclusions that lead to this procedure are derived in large part from the available data. Always take into account that the treatment will also be dependent on availability of resources and existing treatment guidelines, considered by other Pain Management Practitioners as being common knowledge and practice, at the time of the  intervention. For Medico-Legal purposes, it is also important to point out that variation in procedural techniques and pharmacological choices are the acceptable norm. The indications, contraindications, technique, and results of the above procedure should only be interpreted and judged by a Board-Certified Interventional Pain Specialist with extensive familiarity and expertise in the same exact procedure and technique.

## 2023-09-23 NOTE — Patient Instructions (Signed)

## 2023-09-24 ENCOUNTER — Telehealth: Payer: Self-pay | Admitting: *Deleted

## 2023-09-24 NOTE — Telephone Encounter (Signed)
 Post procedure call; voicemail left

## 2023-10-28 ENCOUNTER — Ambulatory Visit: Admitting: Student in an Organized Health Care Education/Training Program

## 2023-10-29 ENCOUNTER — Encounter: Payer: Self-pay | Admitting: Student in an Organized Health Care Education/Training Program

## 2023-10-29 ENCOUNTER — Ambulatory Visit
Attending: Student in an Organized Health Care Education/Training Program | Admitting: Student in an Organized Health Care Education/Training Program

## 2023-10-29 VITALS — BP 111/71 | HR 94 | Temp 97.3°F | Resp 16 | Ht 68.0 in | Wt 216.0 lb

## 2023-10-29 DIAGNOSIS — M25561 Pain in right knee: Secondary | ICD-10-CM | POA: Diagnosis present

## 2023-10-29 DIAGNOSIS — G8929 Other chronic pain: Secondary | ICD-10-CM | POA: Insufficient documentation

## 2023-10-29 DIAGNOSIS — M25562 Pain in left knee: Secondary | ICD-10-CM

## 2023-10-29 DIAGNOSIS — M170 Bilateral primary osteoarthritis of knee: Secondary | ICD-10-CM

## 2023-10-29 DIAGNOSIS — M47816 Spondylosis without myelopathy or radiculopathy, lumbar region: Secondary | ICD-10-CM

## 2023-10-29 DIAGNOSIS — G894 Chronic pain syndrome: Secondary | ICD-10-CM

## 2023-10-29 NOTE — Progress Notes (Signed)
 PROVIDER NOTE: Interpretation of information contained herein should be left to medically-trained personnel. Specific patient instructions are provided elsewhere under "Patient Instructions" section of medical record. This document was created in part using AI and STT-dictation technology, any transcriptional errors that may result from this process are unintentional.  Patient: Peter Webster  Service: E/M   [facility]P: [facility]  DOB: 31-Dec-1940  DOS: 10/29/2023  Provider: Cherylin Corrigan, NP  MRN: [id number]  Delivery: Face-to-face  Specialty: Interventional Pain Management  Type: Established Patient  Setting: Ambulatory outpatient facility  Specialty designation: 09  Referring Prov.: [facility]  Location: Outpatient office facility       HPI  [name], a 83 y.o. year old male, is here today because of his Chronic pain of right knee [M25.561, G89.29]. Mr. Kretschmer primary complain today is Knee Pain (right)   Pain Assessment: Severity of Chronic pain is reported as a 8 /10. Location: Knee Right/ . Onset: More than a month ago. Quality: Sharp. Timing: Intermittent. Modifying factor(s): sitting. Vitals:  height is 5\' 8"  (1.727 m) and weight is 216 lb (98 kg). His temporal temperature is 97.3 F (36.3 C) (abnormal). His blood pressure is 111/71 and his pulse is 94. His respiration is 16 and oxygen saturation is 100%.  BMI: Estimated body mass index is 32.84 kg/m as calculated from the following:   Height as of this encounter: 5\' 8"  (1.727 m).   Weight as of this encounter: 216 lb (98 kg). Last encounter: 09/24/2023.  Reason for encounter: post-procedure evaluation and assessment and evaluation of worsening, or previously known (established) problem.   The lumbar facet medial branch block #2 was performed on September 23, 2023, resulting in 100% short-term and 60% long-term pain relief.  He also experienced improved relief from the left knee injection that was performed on  September 23, 2023, providing improved relief; however his right knee becomes buckles, forcing him to stop activity due to persistent pain.   Of note, he had a bilateral knee injection #1 on 12/24/2022 with provided 60% pain relief for approximately 3 months, he had another bilateral knee injection #2 on 04/01/2023 with provided 60% pain relief for approximately medically 6 months.  Repeated bilateral knee injection on 09/23/2023 resulting in 100% short-term and 60% long-term pain relief.  Given the reoccurrence of the right knee pain, we discussed repeating the right knee injection.  These were gel injections done with Hyalgan.  Of note, he had a Lumbar facet, medial branch block(s)  #1 on 04/01/2023 with 60% pain relief for approximately 6 months.  Repeated lumbar facet medial branch block(s) #2 on 09/23/2023 with 100% short-term and 60% long-term pain relief.  Future considerations would include lumbar radiofrequency ablation.  ROS  Constitutional: Denies any fever or chills Gastrointestinal: No reported hemesis, hematochezia, vomiting, or acute GI distress Musculoskeletal: Right knee pain, weakness, limited ROM  Neurological: No reported episodes of acute onset apraxia, aphasia, dysarthria, agnosia, amnesia, paralysis, loss of coordination, or loss of consciousness  Medication Review  Abdominal Binder/Elastic XL, Calcium Carb-Cholecalciferol, DSS, FLUoxetine, acetaminophen , acyclovir, amLODipine, ammonium lactate, aspirin EC, carbidopa-levodopa, cetirizine, clobetasol ointment, famotidine, gabapentin , hydrocortisone, lenalidomide, lidocaine , losartan, pantoprazole, polyvinyl alcohol, rosuvastatin, sildenafil, simvastatin, tiZANidine, traMADol , triamcinolone ointment, and urea  History Review  Allergy: Mr. Hoskin is allergic to lisinopril. Drug: Mr. Klasen  reports no history of drug use. Alcohol:  reports current alcohol use of about 1.0 standard drink of alcohol per week. Tobacco:  reports that he  has never smoked. He has  never used smokeless tobacco. Social: Mr. Vanderveen  reports that he has never smoked. He has never used smokeless tobacco. He reports current alcohol use of about 1.0 standard drink of alcohol per week. He reports that he does not use drugs. Medical:  has a past medical history of Acute kidney failure (HCC), Allergy, Arthritis, Bladder cancer (HCC), Chronic pain syndrome, DDD (degenerative disc disease), lumbar, ED (erectile dysfunction), Elevated PSA, GERD (gastroesophageal reflux disease), Headache, Hernia of anterior abdominal wall, Hyperlipidemia, Hypertension, Multiple myeloma not having achieved remission (HCC), Right inguinal hernia, Sleep apnea, Thrombocytopenia (HCC), and Wedge compression fracture of t11-T12 vertebra, sequela. Surgical: Mr. Mcelwain  has a past surgical history that includes Colonoscopy (2019); bilateral hernia (2018); XI robotic assisted ventral hernia (N/A, 12/18/2021); and Insertion of mesh (12/18/2021). Family: family history includes Cervical cancer in his mother; Stroke in his mother.  Laboratory Chemistry Profile   Renal Lab Results  Component Value Date   BUN 22 11/20/2021   CREATININE 1.96 (H) 11/20/2021   BCR 11 11/20/2021    Hepatic Lab Results  Component Value Date   AST 18 11/20/2021   ALBUMIN 4.6 11/20/2021   ALKPHOS 82 11/20/2021    Electrolytes Lab Results  Component Value Date   NA 142 11/20/2021   K 3.7 11/20/2021   CL 104 11/20/2021   CALCIUM 10.1 11/20/2021   MG 2.0 11/20/2021    Bone Lab Results  Component Value Date   25OHVITD1 38 11/20/2021   25OHVITD2 1.0 11/20/2021   25OHVITD3 37 11/20/2021    Inflammation (CRP: Acute Phase) (ESR: Chronic Phase) No results found for: "CRP", "ESRSEDRATE", "LATICACIDVEN"       Note: Above Lab results reviewed.  Recent Imaging Review  DG PAIN CLINIC C-ARM 1-60 MIN NO REPORT Fluoro was used, but no Radiologist interpretation will be provided.  Please refer to "NOTES" tab  for provider progress note. Note: Reviewed        Physical Exam  General appearance: alert, cooperative, oriented, and Well nourished, well developed, and well hydrated. In no apparent acute distress Mental status: Alert, oriented x 3 (person, place, & time)       Respiratory: No evidence of acute respiratory distress Eyes: PERLA Vitals: BP 111/71 (Cuff Size: Large)   Pulse 94   Temp (!) 97.3 F (36.3 C) (Temporal)   Resp 16   Ht 5\' 8"  (1.727 m)   Wt 216 lb (98 kg)   SpO2 100%   BMI 32.84 kg/m  BMI: Estimated body mass index is 32.84 kg/m as calculated from the following:   Height as of this encounter: 5\' 8"  (1.727 m).   Weight as of this encounter: 216 lb (98 kg). Ideal: Ideal body weight: 68.4 kg (150 lb 12.7 oz) Adjusted ideal body weight: 80.2 kg (176 lb 14 oz)  Right knee pain worse with walking and weight bearing  Assessment   Diagnosis Status  1. Chronic pain of right knee   2. Bilateral chronic knee pain   3. Bilateral primary osteoarthritis of knee   4. Lumbar facet arthropathy   5. Lumbar spondylosis   6. Chronic pain syndrome    Having a Flare-up Controlled Controlled   Plan of Care  Assessment and Plan  Given a flareup of right knee pain that was previously responsive to the right knee injection in September 2024 and March 2025, we discussed repeating right knee Hyalgan and patient would like to proceed with the plan. Genicular nerve block is a future treatment option if the  knee injection provides adequate relief.   We also discussed lumbar radiofrequency ablation (RFA) as patient received 60% relief from Lumbar facet, medial branch block(s) on two different occasions. Given the reoccurrence of the lumbar pain we we will consider radiofrequency ablation (RFA) for his future plan.  Pharmacotherapy (Medications Ordered): No orders of the defined types were placed in this encounter.  Orders:  Orders Placed This Encounter  Procedures   KNEE INJECTION     Indications: Knee arthralgia (pain) due to osteoarthritis (OA) Imaging: None (CPT-20610) Position: Sitting Equipment/Materials: Block tray  1.5", 25-G (one per side)  Local anesthetic  Hyalgan (one per side)    Standing Status:   Future    Expected Date:   11/16/2023    Expiration Date:   10/28/2024    Scheduling Instructions:     Procedure: Knee injection Hyalgan (Hyaluronan/Hyaluronic acid)     Treatment No.:  4         Level: Intra-articular     Laterality: RIGHT     Sedation: Patient's choice.    Where will this procedure be performed?:   [facility]   Follow-up plan:   Return in about 18 days (around 11/16/2023) for Right knee hyalgan #4.    Recent Visits Date Type Provider Dept  09/23/23 Procedure visit Cephus Collin, MD [facility]  08/27/23 Office Visit Cephus Collin, MD [facility]  Showing recent visits within past 90 days and meeting all other requirements Today's Visits Date Type Provider Dept  10/29/23 Office Visit Cephus Collin, MD [facility]  Showing today's visits and meeting all other requirements Future Appointments Date Type Provider Dept  11/16/23 Appointment Cephus Collin, MD [facility]  Showing future appointments within next 90 days and meeting all other requirements  I discussed the assessment and treatment plan with the patient. The patient was provided an opportunity to ask questions and all were answered. The patient agreed with the plan and demonstrated an understanding of the instructions.  Patient advised to call back or seek an in-person evaluation if the symptoms or condition worsens.  Duration of encounter: 30 minutes.  Total time on encounter, as per AMA guidelines included both the face-to-face and non-face-to-face time personally spent by the physician and/or other qualified health care professional(s) on the day of the encounter (includes time in activities that require the physician or other  qualified health care professional and does not include time in activities normally performed by clinical staff). Physician's time may include the following activities when performed: Preparing to see the patient (e.g., pre-charting review of records, searching for previously ordered imaging, lab work, and nerve conduction tests) Review of prior analgesic pharmacotherapies. Reviewing PMP Interpreting ordered tests (e.g., lab work, imaging, nerve conduction tests) Performing post-procedure evaluations, including interpretation of diagnostic procedures Obtaining and/or reviewing separately obtained history Performing a medically appropriate examination and/or evaluation Counseling and educating the patient/family/caregiver Ordering medications, tests, or procedures Referring and communicating with other health care professionals (when not separately reported) Documenting clinical information in the electronic or other health record Independently interpreting results (not separately reported) and communicating results to the patient/ family/caregiver Care coordination (not separately reported)  Note by: Sharlot Sturkey K Zahraa Bhargava, NP (TTS and AI technology used. I apologize for any typographical errors that were not detected and corrected.) Date: 10/29/2023; Time: 2:22 PM

## 2023-10-29 NOTE — Patient Instructions (Signed)
 ______________________________________________________________________    Preparing for your procedure  Appointments: If you think you may not be able to keep your appointment, call 24-48 hours in advance to cancel. We need time to make it available to others.  Procedure visits are for procedures only. During your procedure appointment there will be: NO Prescription Refills*. NO medication changes or discussions*. NO discussion of disability issues*. NO unrelated pain problem evaluations*. NO evaluations to order other pain procedures*. *These will be addressed at a separate and distinct evaluation encounter on the provider's evaluation schedule and not during procedure days.  Instructions: FMorning Medicines: Except for blood thinners, take all of your other morning medications with a sip of water. Make sure to take your heart and blood pressure medicines. If your blood pressure's lower number is above 100, the case will be rescheduled. Blood thinners: Make sure to stop your blood thinners as instructed.  If you take a blood thinner, but were not instructed to stop it, call our office [phone] and ask to talk to a nurse. Not stopping a blood thinner prior to certain procedures could lead to serious complications. Diabetics on insulin: Notify the staff so that you can be scheduled 1st case in the morning. If your diabetes requires high dose insulin, take only  of your normal insulin dose the morning of the procedure and notify the staff that you have done so. Preventing infections: Shower with an antibacterial soap the morning of your procedure.  Build-up your immune system: Take 1000 mg of Vitamin C with every meal (3 times a day) the day prior to your procedure. Antibiotics: Inform the nursing staff if you are taking any antibiotics or if you have any conditions that may require antibiotics prior to procedures. (Example: recent joint implants)   Pregnancy: If you are pregnant make  sure to notify the nursing staff. Not doing so may result in injury to the fetus, including death.  Sickness: If you have a cold, fever, or any active infections, call and cancel or reschedule your procedure. Receiving steroids while having an infection may result in complications. Arrival: You must be in the facility at least 30 minutes prior to your scheduled procedure. Tardiness: Your scheduled time is also the cutoff time. If you do not arrive at least 15 minutes prior to your procedure, you will be rescheduled.  Children: Do not bring any children with you. Make arrangements to keep them home. Dress appropriately: There is always a possibility that your clothing may get soiled. Avoid long dresses. Valuables: Do not bring any jewelry or valuables.  Reasons to call and reschedule or cancel your procedure: (Following these recommendations will minimize the risk of a serious complication.) Surgeries: Avoid having procedures within 2 weeks of any surgery. (Avoid for 2 weeks before or after any surgery). Flu Shots: Avoid having procedures within 2 weeks of a flu shots or . (Avoid for 2 weeks before or after immunizations). Barium: Avoid having a procedure within 7-10 days after having had a radiological study involving the use of radiological contrast. (Myelograms, Barium swallow or enema study). Heart attacks: Avoid any elective procedures or surgeries for the initial 6 months after a "Myocardial Infarction" (Heart Attack). Blood thinners: It is imperative that you stop these medications before procedures. Let us  know if you if you take any blood thinner.  Infection: Avoid procedures during or within two weeks of an infection (including chest colds or gastrointestinal problems). Symptoms associated with infections include: Localized redness, fever, chills, night sweats or  profuse sweating, burning sensation when voiding, cough, congestion, stuffiness, runny nose, sore throat, diarrhea, nausea,  vomiting, cold or Flu symptoms, recent or current infections. It is specially important if the infection is over the area that we intend to treat. Heart and lung problems: Symptoms that may suggest an active cardiopulmonary problem include: cough, chest pain, breathing difficulties or shortness of breath, dizziness, ankle swelling, uncontrolled high or unusually low blood pressure, and/or palpitations. If you are experiencing any of these symptoms, cancel your procedure and contact your primary care physician for an evaluation.  Remember:  Regular Business hours are:  Monday to Thursday 8:00 AM to 4:00 PM  Provider's Schedule: Renaldo Caroli, MD:  Procedure days: Tuesday and Thursday 7:30 AM to 4:00 PM  Cephus Collin, MD:  Procedure days: Monday and Wednesday 7:30 AM to 4:00 PM Last  Updated: 06/16/2023 ______________________________________________________________________

## 2023-11-11 ENCOUNTER — Other Ambulatory Visit: Payer: Self-pay

## 2023-11-11 DIAGNOSIS — G20A1 Parkinson's disease without dyskinesia, without mention of fluctuations: Secondary | ICD-10-CM

## 2023-11-16 ENCOUNTER — Encounter: Payer: Self-pay | Admitting: Student in an Organized Health Care Education/Training Program

## 2023-11-16 ENCOUNTER — Ambulatory Visit
Attending: Student in an Organized Health Care Education/Training Program | Admitting: Student in an Organized Health Care Education/Training Program

## 2023-11-16 VITALS — BP 110/70 | HR 95 | Temp 97.5°F | Ht 71.0 in | Wt 216.0 lb

## 2023-11-16 DIAGNOSIS — M25561 Pain in right knee: Secondary | ICD-10-CM | POA: Diagnosis present

## 2023-11-16 DIAGNOSIS — M25562 Pain in left knee: Secondary | ICD-10-CM | POA: Diagnosis present

## 2023-11-16 DIAGNOSIS — M170 Bilateral primary osteoarthritis of knee: Secondary | ICD-10-CM | POA: Diagnosis present

## 2023-11-16 DIAGNOSIS — G8929 Other chronic pain: Secondary | ICD-10-CM | POA: Diagnosis present

## 2023-11-16 MED ORDER — SODIUM HYALURONATE (VISCOSUP) 20 MG/2ML IX SOSY
2.0000 mL | PREFILLED_SYRINGE | Freq: Once | INTRA_ARTICULAR | Status: AC
  Administered 2023-11-16: 20 mg via INTRA_ARTICULAR

## 2023-11-16 MED ORDER — LIDOCAINE HCL 2 % IJ SOLN
20.0000 mL | Freq: Once | INTRAMUSCULAR | Status: AC
  Administered 2023-11-16: 200 mg

## 2023-11-16 MED ORDER — LIDOCAINE HCL (PF) 2 % IJ SOLN
INTRAMUSCULAR | Status: AC
  Filled 2023-11-16: qty 10

## 2023-11-16 NOTE — Patient Instructions (Signed)

## 2023-11-16 NOTE — Progress Notes (Signed)
 PROVIDER NOTE: Interpretation of information contained herein should be left to medically-trained personnel. Specific patient instructions are provided elsewhere under "Patient Instructions" section of medical record. This document was created in part using STT-dictation technology, any transcriptional errors that may result from this process are unintentional.  Patient: Peter Webster Type: Established DOB: [birth year] MRN: [id number] PCP: [facility]  Service: Procedure DOS: 11/16/2023 Setting: Ambulatory Location: Ambulatory outpatient facility Delivery: Face-to-face Provider: Cephus Collin, MD Specialty: Interventional Pain Management Specialty designation: 09 Location: Outpatient facility Ref. Prov.: [facility]       Interventional Therapy   Type:  Hyalgan Intra-articular Knee Injection #4  Laterality: Right (-RT) Level/approach: Medial Imaging guidance: None required ([id number]) Anesthesia: Local anesthesia (1-2% Lidocaine ) DOS: 11/16/2023  Performed by: Cephus Collin, MD  Purpose: Diagnostic/Therapeutic Indications: Knee arthralgia associated to osteoarthritis of the knee 1. Chronic pain of right knee   2. Bilateral chronic knee pain   3. Bilateral primary osteoarthritis of knee     NAS-11 score:   Pre-procedure: 8 /10   Post-procedure: 8 /10     Pre-Procedure Preparation  Monitoring: As per clinic protocol.  Risk Assessment: Vitals:  NGE:[id number] body mass index is 30.13 kg/m as calculated from the following:   Height as of this encounter: 5\' 11"  (1.803 m).   Weight as of this encounter: 216 lb (98 kg)., Rate:95 , BP:110/70, Resp: , Temp:(!) 97.5 F (36.4 C), SpO2:100 %  Allergies: He is allergic to lisinopril.  Precautions: No additional precautions required  Blood-thinner(s): None at this time  Coagulopathies: Reviewed. None identified.   Active Infection(s): Reviewed. None identified. Peter Webster is afebrile   Location setting: Exam  room Position: Sitting w/ knee bent 90 degrees Safety Precautions: Patient was assessed for positional comfort and pressure points before starting the procedure. Prepping solution: DuraPrep (Iodine Povacrylex [0.7% available iodine] and Isopropyl Alcohol, 74% w/w) Prep Area: Entire knee region Approach: percutaneous, just above the tibial plateau, lateral to the infrapatellar tendon. Intended target: Intra-articular knee space Materials: Tray: Block Needle(s): Regular Qty: 1/side Length: 1.5-inch Gauge: 25G (x1) + 22G (x1)  Meds ordered this encounter  Medications   Sodium Hyaluronate (Viscosup) SOSY 20 mg    Do not substitute. Deliver to facility day before procedure.   lidocaine  (XYLOCAINE ) 2 % (with pres) injection 400 mg    No orders of the defined types were placed in this encounter.    Time-out: 1418 I initiated and conducted the "Time-out" before starting the procedure, as per protocol. The patient was asked to participate by confirming the accuracy of the "Time Out" information. Verification of the correct person, site, and procedure were performed and confirmed by me, the nursing staff, and the patient. "Time-out" conducted as per Joint Commission's Universal Protocol (UP.01.01.01). Procedure checklist: Completed  H&P (Pre-op  Assessment)  Peter Webster is a 83 y.o. (year old), male patient, seen today for interventional treatment. He  has a past surgical history that includes Colonoscopy (2019); bilateral hernia (2018); XI robotic assisted ventral hernia (N/A, 12/18/2021); and Insertion of mesh (12/18/2021). Peter Webster has a current medication list which includes the following prescription(s): acetaminophen , acyclovir, amlodipine, ammonium lactate, aspirin ec, calcium carb-cholecalciferol, carbidopa-levodopa, cetirizine, clobetasol ointment, dss, abdominal binder/elastic xl, famotidine, fluoxetine, gabapentin , hydrocortisone, lenalidomide, lidocaine , losartan, pantoprazole, polyvinyl  alcohol, rosuvastatin, sildenafil, simvastatin, tizanidine, tramadol , triamcinolone ointment, and urea. His primarily concern today is the Knee Pain (right)  He is allergic to lisinopril.   Last encounter: My last encounter with him was on 11/24/2022. Pertinent problems: Mr.  Petrea has Multiple myeloma not having achieved remission (HCC); Thrombocytopenia (HCC); Wedge compression fracture of t11-T12 vertebra, sequela; Chronic bilateral low back pain with left-sided sciatica; Lumbar degenerative disc disease; Lumbar facet arthropathy; Chronic radicular lumbar pain; Chronic pain syndrome; Compression fracture of thoracic vertebra (HCC); and Thoracic spondylosis on their pertinent problem list. Pain Assessment: Severity of Chronic pain is reported as a 8 /10. Location: Knee Right/radiates to right ankle. Onset: More than a month ago. Quality: Sharp. Timing: Constant. Modifying factor(s): meds. Vitals:  height is 5\' 11"  (1.803 m) and weight is 216 lb (98 kg). His temperature is 97.5 F (36.4 C) (abnormal). His blood pressure is 110/70 and his pulse is 95. His oxygen saturation is 100%.   Reason for encounter: "interventional pain management therapy due pain of at least four (4) weeks in duration, with failure to respond and/or inability to tolerate more conservative care.  Site Confirmation: Peter Webster was asked to confirm the procedure and laterality before marking the site.  Consent: Before the procedure and under the influence of no sedative(s), amnesic(s), or anxiolytics, the patient was informed of the treatment options, risks and possible complications. To fulfill our ethical and legal obligations, as recommended by the American Medical Association's Code of Ethics, I have informed the patient of my clinical impression; the nature and purpose of the treatment or procedure; the risks, benefits, and possible complications of the intervention; the alternatives, including doing nothing; the risk(s) and  benefit(s) of the alternative treatment(s) or procedure(s); and the risk(s) and benefit(s) of doing nothing. The patient was provided information about the general risks and possible complications associated with the procedure. These may include, but are not limited to: failure to achieve desired goals, infection, bleeding, organ or nerve damage, allergic reactions, paralysis, and death. In addition, the patient was informed of those risks and complications associated to Spine-related procedures, such as failure to decrease pain; infection (i.e.: Meningitis, epidural or intraspinal abscess); bleeding (i.e.: epidural hematoma, subarachnoid hemorrhage, or any other type of intraspinal or peri-dural bleeding); organ or nerve damage (i.e.: Any type of peripheral nerve, nerve root, or spinal cord injury) with subsequent damage to sensory, motor, and/or autonomic systems, resulting in permanent pain, numbness, and/or weakness of one or several areas of the body; allergic reactions; (i.e.: anaphylactic reaction); and/or death. Furthermore, the patient was informed of those risks and complications associated with the medications. These include, but are not limited to: allergic reactions (i.e.: anaphylactic or anaphylactoid reaction(s)); adrenal axis suppression; blood sugar elevation that in diabetics may result in ketoacidosis or comma; water retention that in patients with history of congestive heart failure may result in shortness of breath, pulmonary edema, and decompensation with resultant heart failure; weight gain; swelling or edema; medication-induced neural toxicity; particulate matter embolism and blood vessel occlusion with resultant organ, and/or nervous system infarction; and/or aseptic necrosis of one or more joints. Finally, the patient was informed that Medicine is not an exact science; therefore, there is also the possibility of unforeseen or unpredictable risks and/or possible complications that may  result in a catastrophic outcome. The patient indicated having understood very clearly. We have given the patient no guarantees and we have made no promises. Enough time was given to the patient to ask questions, all of which were answered to the patient's satisfaction. Mr. Copus has indicated that he wanted to continue with the procedure. Attestation: I, the ordering provider, attest that I have discussed with the patient the benefits, risks, side-effects, alternatives, likelihood of achieving goals,  and potential problems during recovery for the procedure that I have provided informed consent.  Date  Time: 11/16/2023  1:56 PM  Description of procedure  Start Time: 1418 hrs  Local Anesthesia: Once the patient was positioned, prepped, and time-out was completed. The target area was identified located. The skin was marked with an approved surgical skin marker. Once marked, the skin (epidermis, dermis, and hypodermis), and deeper tissues (fat, connective tissue and muscle) were infiltrated with a small amount of a short-acting local anesthetic, loaded on a 10cc syringe with a 25G, 1.5-in  Needle. An appropriate amount of time was allowed for local anesthetics to take effect before proceeding to the next step. Local Anesthetic: Lidocaine  1-2% The unused portion of the local anesthetic was discarded in the proper designated containers. Safety Precautions: Aspiration looking for blood return was conducted prior to all injections. At no point did I inject any substances, as a needle was being advanced. Before injecting, the patient was told to immediately notify me if he was experiencing any new onset of "ringing in the ears, or metallic taste in the mouth". No attempts were made at seeking any paresthesias. Safe injection practices and needle disposal techniques used. Medications properly checked for expiration dates. SDV (single dose vial) medications used. After the completion of the procedure, all  disposable equipment used was discarded in the proper designated medical waste containers.  Technical description: Protocol guidelines were followed. After positioning, the target area was identified and prepped in the usual manner. Skin & deeper tissues infiltrated with local anesthetic. Appropriate amount of time allowed to pass for local anesthetics to take effect. Proper needle placement secured. Once satisfactory needle placement was confirmed, I proceeded to inject the desired solution in slow, incremental fashion, intermittently assessing for discomfort or any signs of abnormal or undesired spread of substance. Once completed, the needle was removed and disposed of, as per hospital protocols. The area was cleaned, making sure to leave some of the prepping solution back to take advantage of its long term bactericidal properties.  Aspiration:  Negative        Vitals:   11/16/23 1401 11/16/23 1403  BP:  110/70  Pulse:  95  Temp:  (!) 97.5 F (36.4 C)  SpO2:  100%  Weight: 216 lb (98 kg)   Height: 5\' 11"  (1.803 m)     End Time: 1420 hrs  Imaging guidance  Imaging-assisted Technique: None required. Indication(s): N/A Exposure Time: N/A Contrast: None Fluoroscopic Guidance: N/A Ultrasound Guidance: N/A Interpretation: N/A  Post-op assessment  Post-procedure Vital Signs:  Pulse/HCG Rate: 95  Temp: (!) 97.5 F (36.4 C) Resp:   BP: 110/70 SpO2: 100 %  EBL: None  Complications: No immediate post-treatment complications observed by team, or reported by patient.  Note: The patient tolerated the entire procedure well. A repeat set of vitals were taken after the procedure and the patient was kept under observation following institutional policy, for this type of procedure. Post-procedural neurological assessment was performed, showing return to baseline, prior to discharge. The patient was provided with post-procedure discharge instructions, including a section on how to  identify potential problems. Should any problems arise concerning this procedure, the patient was given instructions to immediately contact us , at any time, without hesitation. In any case, we plan to contact the patient by telephone for a follow-up status report regarding this interventional procedure.  Comments:  No additional relevant information.  Plan of care  Patient is status post 4 Hyalgan injections into his  right knee.  Future considerations would include right knee genicular nerve block if he does not get pain relief with his Hyalgan injection.  Medications administered: We administered Sodium Hyaluronate (Viscosup) and lidocaine .  Follow-up plan:   Return today (on 11/16/2023), or to call prn.     Recent Visits Date Type Provider Dept  10/29/23 Office Visit Cephus Collin, MD [facility]  09/23/23 Procedure visit Cephus Collin, MD [facility]  08/27/23 Office Visit Cephus Collin, MD [facility]  Showing recent visits within past 90 days and meeting all other requirements Today's Visits Date Type Provider Dept  11/16/23 Procedure visit Cephus Collin, MD [facility]  Showing today's visits and meeting all other requirements Future Appointments No visits were found meeting these conditions. Showing future appointments within next 90 days and meeting all other requirements   Disposition: Discharge home  Discharge (Date  Time): 11/16/2023; 1423 hrs.   Primary Care Physician: [facility] Location: [facility] Note by: Cephus Collin, MD Date: 11/16/2023; Time: 3:21 PM  DISCLAIMER: Medicine is not an Visual merchandiser. It has no guarantees or warranties. The decision to proceed with this intervention was based on the information collected from the patient. Conclusions were drawn from the patient's questionnaire, interview, and examination. Because information was provided in large part by the patient, it  cannot be guaranteed that it has not been purposely or unconsciously manipulated or altered. Every effort has been made to obtain as much accurate, relevant, available data as possible. Always take into account that the treatment will also be dependent on availability of resources and existing treatment guidelines, considered by other Pain Management Specialists as being common knowledge and practice, at the time of the intervention. It is also important to point out that variation in procedural techniques and pharmacological choices are the acceptable norm. For Medico-Legal review purposes, the indications, contraindications, technique, and results of the these procedures should only be evaluated, judged and interpreted by a Board-Certified Interventional Pain Specialist with extensive familiarity and expertise in the same exact procedure and technique.

## 2023-11-16 NOTE — Progress Notes (Signed)
 Safety precautions to be maintained throughout the outpatient stay will include: orient to surroundings, keep bed in low position, maintain call bell within reach at all times, provide assistance with transfer out of bed and ambulation.

## 2023-11-17 ENCOUNTER — Ambulatory Visit
Admission: RE | Admit: 2023-11-17 | Discharge: 2023-11-17 | Disposition: A | Discharge status: Home / Self Care | Source: Ambulatory Visit

## 2023-11-17 ENCOUNTER — Telehealth: Payer: Self-pay

## 2023-11-17 DIAGNOSIS — G20A1 Parkinson's disease without dyskinesia, without mention of fluctuations: Secondary | ICD-10-CM | POA: Diagnosis present

## 2023-11-17 NOTE — Telephone Encounter (Signed)
 Attempt to contact for post-procedure follow-up. No answer and LVM.

## 2023-12-14 ENCOUNTER — Telehealth: Payer: Self-pay | Admitting: Physician Assistant

## 2023-12-14 NOTE — Telephone Encounter (Signed)
 Received updated authorization from [place] to see patient for BCG treatment. New authorization scanned in chart under media. New auth dates are 12/11/23-06/08/24

## 2023-12-29 ENCOUNTER — Other Ambulatory Visit: Payer: Self-pay | Admitting: Internal Medicine

## 2023-12-29 DIAGNOSIS — R0602 Shortness of breath: Secondary | ICD-10-CM

## 2024-01-18 ENCOUNTER — Ambulatory Visit

## 2024-01-26 ENCOUNTER — Ambulatory Visit
Attending: Student in an Organized Health Care Education/Training Program | Admitting: Student in an Organized Health Care Education/Training Program

## 2024-01-26 ENCOUNTER — Encounter: Payer: Self-pay | Admitting: Student in an Organized Health Care Education/Training Program

## 2024-01-26 VITALS — BP 145/94 | HR 88 | Temp 97.2°F | Resp 16 | Ht 71.0 in | Wt 217.0 lb

## 2024-01-26 DIAGNOSIS — M170 Bilateral primary osteoarthritis of knee: Secondary | ICD-10-CM | POA: Insufficient documentation

## 2024-01-26 DIAGNOSIS — M25561 Pain in right knee: Secondary | ICD-10-CM | POA: Diagnosis present

## 2024-01-26 DIAGNOSIS — G8929 Other chronic pain: Secondary | ICD-10-CM | POA: Insufficient documentation

## 2024-01-26 DIAGNOSIS — M25562 Pain in left knee: Secondary | ICD-10-CM | POA: Diagnosis present

## 2024-01-26 DIAGNOSIS — M47816 Spondylosis without myelopathy or radiculopathy, lumbar region: Secondary | ICD-10-CM | POA: Insufficient documentation

## 2024-01-26 DIAGNOSIS — G894 Chronic pain syndrome: Secondary | ICD-10-CM | POA: Diagnosis not present

## 2024-01-26 MED ORDER — TRAMADOL HCL 50 MG PO TABS
50.0000 mg | ORAL_TABLET | Freq: Two times a day (BID) | ORAL | 2 refills | Status: AC | PRN
Start: 2024-01-26 — End: ?

## 2024-01-26 MED ORDER — JOURNAVX 50 MG PO TABS: 50.0000 mg | ORAL_TABLET | Freq: Two times a day (BID) | ORAL | 1 refills | Status: DC

## 2024-01-26 NOTE — Patient Instructions (Signed)
 Provide pt with coupon card for [employer] Follow up for GNB

## 2024-01-26 NOTE — Progress Notes (Signed)
 PROVIDER NOTE: Interpretation of information contained herein should be left to medically-trained personnel. Specific patient instructions are provided elsewhere under Patient Instructions section of medical record. This document was created in part using AI and STT-dictation technology, any transcriptional errors that may result from this process are unintentional.  Patient: Peter Webster  Service: E/M   PCP: [facility]  DOB: [birth year]  DOS: 01/26/2024  Provider: Wallie Sherry, MD  MRN: [id number]  Delivery: Face-to-face  Specialty: Interventional Pain Management  Type: Established Patient  Setting: Ambulatory outpatient facility  Specialty designation: 09  Referring Prov.: [facility]  Location: Outpatient office facility       History of present illness (HPI) [name], a 83 y.o. year old male, is here today because of his Bilateral chronic knee pain [M25.561, M25.562, G89.29]. [name] primary complain today is Knee Pain (Bilateral )  Pertinent problems: [name] has Multiple myeloma not having achieved remission (HCC); Thrombocytopenia (HCC); Wedge compression fracture of t11-T12 vertebra, sequela; Chronic bilateral low back pain with left-sided sciatica; Lumbar degenerative disc disease; Lumbar facet arthropathy; Chronic radicular lumbar pain; Chronic pain syndrome; Compression fracture of thoracic vertebra (HCC); and Thoracic spondylosis on their pertinent problem list.  Pain Assessment: Severity of Chronic pain is reported as a 8 /10. Location: Knee Right, Left/Can radiate down to outer lateral  leg to ankle bilateral. Onset: More than a month ago. Quality: Aching, Constant, Sharp. Timing: Constant. Modifying factor(s): Knee injections help some, tramadol , and Tylenol . Vitals:  height is 5' 11 (1.803 m) and weight is 217 lb (98.4 kg). His temporal temperature is 97.2 F (36.2 C) (abnormal). His respiration is 16.  BMI: Estimated body mass index is  30.27 kg/m as calculated from the following:   Height as of this encounter: 5' 11 (1.803 m).   Weight as of this encounter: 217 lb (98.4 kg).  Last encounter: 10/29/2023. Last procedure: 11/16/2023.  Reason for encounter: both, medication management and post-procedure evaluation and assessment.   Post-Procedure Evaluation   Type:  Hyalgan Intra-articular Knee Injection #4  Laterality: Right (-RT) Level/approach: Medial Imaging guidance: None required ([id number]) Anesthesia: Local anesthesia (1-2% Lidocaine ) DOS: 11/16/2023  Performed by: Wallie Sherry, MD  Purpose: Diagnostic/Therapeutic Indications: Knee arthralgia associated to osteoarthritis of the knee 1. Chronic pain of right knee   2. Bilateral chronic knee pain   3. Bilateral primary osteoarthritis of knee     NAS-11 score:   Pre-procedure: 8 /10   Post-procedure: 8 /10     Effectiveness:  Initial hour after procedure: 100 %  Subsequent 4-6 hours post-procedure: 100 %  Analgesia past initial 6 hours: 0 % (About 2 weeks of 100% relief and pain gradaully returned)  Ongoing improvement:  Analgesic:  0%    Pharmacotherapy Assessment   Tramadol  50 mg BID prn.  Monitoring: [place] PMP: PDMP reviewed during this encounter.       Pharmacotherapy: No side-effects or adverse reactions reported. Compliance: No problems identified. Effectiveness: Clinically acceptable.  Peter Norris, RN  01/26/2024 11:44 AM  Sign when Signing Visit Safety precautions to be maintained throughout the outpatient stay will include: orient to surroundings, keep bed in low position, maintain call bell within reach at all times, provide assistance with transfer out of bed and ambulation.   UDS:  Summary  Date Value Ref Range Status  11/20/2021 Note  Final    Comment:    ==================================================================== Compliance Drug Analysis, Ur ==================================================================== Test  Result       Flag       Units  Drug Present and Declared for Prescription Verification   Acetaminophen                   PRESENT      EXPECTED  Drug Absent but Declared for Prescription Verification   Gabapentin                      Not Detected UNEXPECTED   Tizanidine                     Not Detected UNEXPECTED    Tizanidine, as indicated in the declared medication list, is not    always detected even when used as directed.    Prochlorperazine               Not Detected UNEXPECTED   Lidocaine                       Not Detected UNEXPECTED    Lidocaine , as indicated in the declared medication list, is not    always detected even when used as directed.  ==================================================================== Test                      Result    Flag   Units      Ref Range   Creatinine              168              mg/dL      >=79 ==================================================================== Declared Medications:  The flagging and interpretation on this report are based on the  following declared medications.  Unexpected results may arise from  inaccuracies in the declared medications.   **Note: The testing scope of this panel includes these medications:   Gabapentin  (Neurontin )  Prochlorperazine (Compazine)   **Note: The testing scope of this panel does not include small to  moderate amounts of these reported medications:   Acetaminophen  (Tylenol )  Lidocaine  (Xylocaine )  Tizanidine   **Note: The testing scope of this panel does not include the  following reported medications:   Acyclovir  Amlodipine (Norvasc)  Calcium  Capsaicin  Cetirizine (Zyrtec)  Cholecalciferol  Clobetasol  Cyanocobalamin  Docusate  Hydrocortisone  Omeprazole  Sildenafil (Viagra)  Simvastatin (Zocor)  Topical  Triamcinolone (Kenalog) ==================================================================== For clinical consultation, please call [phone]. ====================================================================     No results found for: CBDTHCR No results found for: D8THCCBX No results found for: D9THCCBX  ROS  Constitutional: Denies any fever or chills Gastrointestinal: No reported hemesis, hematochezia, vomiting, or acute GI distress Musculoskeletal: Bilateral knee pain Neurological: No reported episodes of acute onset apraxia, aphasia, dysarthria, agnosia, amnesia, paralysis, loss of coordination, or loss of consciousness  Medication Review  Abdominal Binder/Elastic XL, Calcium Carb-Cholecalciferol, DSS, FLUoxetine, Suzetrigine , acetaminophen , acyclovir, amLODipine, ammonium lactate, artificial tears, aspirin EC, carbidopa-levodopa, cetirizine, clobetasol ointment, famotidine, gabapentin , hydrocortisone, lenalidomide, lidocaine , losartan, pantoprazole, rosuvastatin, sildenafil, simvastatin, tiZANidine, traMADol , triamcinolone ointment, and urea  History Review  Allergy: Mr. Bangura is allergic to lisinopril. Drug: Mr. Rudin  reports no history of drug use. Alcohol:  reports current alcohol use of about 1.0 standard drink of alcohol per week. Tobacco:  reports that he has never smoked. He has never used smokeless tobacco. Social: Mr. Reine  reports that he has never smoked. He has never used smokeless tobacco. He reports current alcohol use of about 1.0  standard drink of alcohol per week. He reports that he does not use drugs. Medical:  has a past medical history of Acute kidney failure (HCC), Allergy, Arthritis, Bladder cancer (HCC), Chronic pain syndrome, DDD (degenerative disc disease), lumbar, ED (erectile dysfunction), Elevated PSA, GERD (gastroesophageal reflux disease), Headache, Hernia of anterior abdominal wall, Hyperlipidemia, Hypertension, Multiple myeloma not having achieved remission (HCC), Right inguinal hernia, Sleep apnea, Thrombocytopenia (HCC), and Wedge compression fracture of t11-T12 vertebra,  sequela. Surgical: Mr. Fyock  has a past surgical history that includes Colonoscopy (2019); bilateral hernia (2018); XI robotic assisted ventral hernia (N/A, 12/18/2021); and Insertion of mesh (12/18/2021). Family: family history includes Cervical cancer in his mother; Stroke in his mother.  Laboratory Chemistry Profile   Renal Lab Results  Component Value Date   BUN 22 11/20/2021   CREATININE 1.96 (H) 11/20/2021   BCR 11 11/20/2021    Hepatic Lab Results  Component Value Date   AST 18 11/20/2021   ALBUMIN 4.6 11/20/2021   ALKPHOS 82 11/20/2021    Electrolytes Lab Results  Component Value Date   NA 142 11/20/2021   K 3.7 11/20/2021   CL 104 11/20/2021   CALCIUM 10.1 11/20/2021   MG 2.0 11/20/2021    Bone Lab Results  Component Value Date   25OHVITD1 38 11/20/2021   25OHVITD2 1.0 11/20/2021   25OHVITD3 37 11/20/2021    Inflammation (CRP: Acute Phase) (ESR: Chronic Phase) No results found for: CRP, ESRSEDRATE, LATICACIDVEN       Note: Above Lab results reviewed.  Recent Imaging Review  MR BRAIN WO CONTRAST EXAMINATION: MR BRAIN WO CONTRAST  HISTORY: Parkinson's disease  TECHNIQUE: MRI of the brain performed without IV contrast.  COMPARISON: None  FINDINGS:  No evidence for intracranial mass, hemorrhage, or acute infarct. There is mild diffuse cerebral volume loss. There are mild bilateral periventricular and subcortical white matter T2 hyperintensities, which are nonspecific, but most likely secondary to chronic microvascular ischemia.  The ventricles are symmetric and the basilar cisterns are patent. Major intracranial flow voids appear preserved.  This retention cyst is seen in the right maxillary sinus. The mastoid air cells appear well aerated. Orbits are normal.  IMPRESSION:  Mild cerebral volume loss with mild chronic microvascular ischemic changes.  Electronically signed by: [place] Engel MD 12/13/2023 03:39 PM EDT RP  Workstation: [id number] Note: Reviewed        Physical Exam  Vitals: Temp (!) 97.2 F (36.2 C) (Temporal)   Resp 16   Ht 5' 11 (1.803 m)   Wt 217 lb (98.4 kg)   BMI 30.27 kg/m  BMI: Estimated body mass index is 30.27 kg/m as calculated from the following:   Height as of this encounter: 5' 11 (1.803 m).   Weight as of this encounter: 217 lb (98.4 kg). Ideal: Ideal body weight: 75.3 kg (166 lb 0.1 oz) Adjusted ideal body weight: 84.6 kg (186 lb 6.5 oz) General appearance: Well nourished, well developed, and well hydrated. In no apparent acute distress Mental status: Alert, oriented x 3 (person, place, & time)       Respiratory: No evidence of acute respiratory distress Eyes: PERLA  Bilateral knee pain worse with weightbearing  Assessment   Diagnosis Status  1. Bilateral chronic knee pain   2. Bilateral primary osteoarthritis of knee   3. Chronic pain of right knee   4. Chronic pain syndrome   5. Lumbar facet arthropathy    Controlled Controlled Controlled   Updated Problems: No problems updated.  Plan  of Care  Patient continues to struggle with bilateral knee pain related to knee osteoarthritis.  He has tried and failed intra-articular knee steroid injections, intra-articular gel viscosupplementation, physical therapy, NSAIDs.  We discussed genicular nerve block and possible RFA.  I will also refill the tramadol .  For his acute worsening knee pain, we discussed Journavax as below  Mr. Romir Klimowicz has a current medication list which includes the following long-term medication(s): amlodipine, calcium carb-cholecalciferol, carbidopa-levodopa, cetirizine, famotidine, fluoxetine, gabapentin , losartan, pantoprazole, rosuvastatin, sildenafil, and simvastatin.  Pharmacotherapy (Medications Ordered): Meds ordered this encounter  Medications   traMADol  (ULTRAM ) 50 MG tablet    Sig: Take 1 tablet (50 mg total) by mouth every 12 (twelve) hours as needed.    Dispense:  60  tablet    Refill:  2   Suzetrigine  (JOURNAVX ) 50 MG TABS    Sig: Take 50 mg by mouth every 12 (twelve) hours.    Dispense:  60 tablet    Refill:  1   Orders:  Orders Placed This Encounter  Procedures   GENICULAR NERVE BLOCK    Indication(s):  Sub-acute knee pain    Standing Status:   Future    Expected Date:   02/09/2024    Expiration Date:   01/25/2025    Scheduling Instructions:     Side: Bilateral     Sedation: without     Timeframe: As soon as schedule allows    Where will this procedure be performed?:   [facility]     B/L Hyalgan 12/24/22, 04/01/2023, 09/23/23.  Left L4-L5 ESI and bilateral L3, L4, L5 facet medial branch nerve block 04/01/2023, 09/23/23    Return in about 2 weeks (around 02/09/2024) for B/L GNB , in clinic (PO Valium  5mg ).    Recent Visits Date Type Provider Dept  11/16/23 Procedure visit Marcelino Nurse, MD [facility]  10/29/23 Office Visit Marcelino Nurse, MD [facility]  Showing recent visits within past 90 days and meeting all other requirements Today's Visits Date Type Provider Dept  01/26/24 Office Visit Marcelino Nurse, MD [facility]  Showing today's visits and meeting all other requirements Future Appointments No visits were found meeting these conditions. Showing future appointments within next 90 days and meeting all other requirements  I discussed the assessment and treatment plan with the patient. The patient was provided an opportunity to ask questions and all were answered. The patient agreed with the plan and demonstrated an understanding of the instructions.  Patient advised to call back or seek an in-person evaluation if the symptoms or condition worsens.  Duration of encounter: .  Total time on encounter, as per AMA guidelines included both the face-to-face and non-face-to-face time personally spent by the physician and/or other qualified health care professional(s) on the day of the encounter  (includes time in activities that require the physician or other qualified health care professional and does not include time in activities normally performed by clinical staff). Physician's time may include the following activities when performed: Preparing to see the patient (e.g., pre-charting review of records, searching for previously ordered imaging, lab work, and nerve conduction tests) Review of prior analgesic pharmacotherapies. Reviewing PMP Interpreting ordered tests (e.g., lab work, imaging, nerve conduction tests) Performing post-procedure evaluations, including interpretation of diagnostic procedures Obtaining and/or reviewing separately obtained history Performing a medically appropriate examination and/or evaluation Counseling and educating the patient/family/caregiver Ordering medications, tests, or procedures Referring and communicating with other health care professionals (when not separately reported) Documenting  clinical information in the electronic or other health record Independently interpreting results (not separately reported) and communicating results to the patient/ family/caregiver Care coordination (not separately reported)  Note by: Wallie Sherry, MD (TTS and AI technology used. I apologize for any typographical errors that were not detected and corrected.) Date: 01/26/2024; Time: 11:56 AM

## 2024-01-26 NOTE — Progress Notes (Signed)
 Safety precautions to be maintained throughout the outpatient stay will include: orient to surroundings, keep bed in low position, maintain call bell within reach at all times, provide assistance with transfer out of bed and ambulation.

## 2024-01-27 ENCOUNTER — Ambulatory Visit
Admission: RE | Admit: 2024-01-27 | Discharge: 2024-01-27 | Disposition: A | Discharge status: Home / Self Care | Source: Ambulatory Visit | Attending: Internal Medicine | Admitting: Internal Medicine

## 2024-01-27 DIAGNOSIS — R0602 Shortness of breath: Secondary | ICD-10-CM | POA: Insufficient documentation

## 2024-02-09 ENCOUNTER — Ambulatory Visit (INDEPENDENT_AMBULATORY_CARE_PROVIDER_SITE_OTHER): Admitting: Physician Assistant

## 2024-02-09 VITALS — BP 129/68 | HR 76 | Ht 71.0 in | Wt 217.0 lb

## 2024-02-09 DIAGNOSIS — C679 Malignant neoplasm of bladder, unspecified: Secondary | ICD-10-CM

## 2024-02-09 LAB — URINALYSIS, COMPLETE
Bilirubin, UA: NEGATIVE
Glucose, UA: NEGATIVE
Ketones, UA: NEGATIVE
Leukocytes,UA: NEGATIVE
Nitrite, UA: NEGATIVE
Specific Gravity, UA: 1.02 (ref 1.005–1.030)
Urobilinogen, Ur: 0.2 mg/dL (ref 0.2–1.0)
pH, UA: 6 (ref 5.0–7.5)

## 2024-02-09 LAB — MICROSCOPIC EXAMINATION

## 2024-02-09 MED ORDER — BCG LIVE 50 MG IS SUSR
3.2400 mL | Freq: Once | INTRAVESICAL | Status: AC
  Administered 2024-02-09: 81 mg via INTRAVESICAL

## 2024-02-09 NOTE — Progress Notes (Signed)
 BCG Bladder Instillation  BCG # 1 of 3  Due to Bladder Cancer patient is present today for a BCG treatment. Patient was cleaned and prepped in a sterile fashion with betadine. A 14FR catheter was inserted, urine return was noted , urine was yellow in color.  50ml of reconstituted BCG was instilled into the bladder. The catheter was then removed. Patient tolerated well, no complications were noted.  Performed by: Vitalia Stough, PA-C and Charisse Blackwell, CMA  Follow up/ Additional notes: 1 week

## 2024-02-10 ENCOUNTER — Ambulatory Visit: Admitting: Student in an Organized Health Care Education/Training Program

## 2024-02-16 ENCOUNTER — Ambulatory Visit: Admitting: Physician Assistant

## 2024-02-16 VITALS — BP 123/71 | HR 91 | Ht 71.0 in | Wt 217.0 lb

## 2024-02-16 DIAGNOSIS — C679 Malignant neoplasm of bladder, unspecified: Secondary | ICD-10-CM | POA: Diagnosis not present

## 2024-02-16 LAB — URINALYSIS, COMPLETE
Bilirubin, UA: NEGATIVE
Glucose, UA: NEGATIVE
Ketones, UA: NEGATIVE
Leukocytes,UA: NEGATIVE
Nitrite, UA: NEGATIVE
Specific Gravity, UA: 1.02 (ref 1.005–1.030)
Urobilinogen, Ur: 0.2 mg/dL (ref 0.2–1.0)
pH, UA: 6 (ref 5.0–7.5)

## 2024-02-16 LAB — MICROSCOPIC EXAMINATION

## 2024-02-16 MED ORDER — BCG LIVE 50 MG IS SUSR
3.2400 mL | Freq: Once | INTRAVESICAL | Status: AC
  Administered 2024-02-16 (×2): 81 mg via INTRAVESICAL

## 2024-02-16 NOTE — Patient Instructions (Signed)

## 2024-02-16 NOTE — Progress Notes (Signed)
 BCG Bladder Instillation  BCG # 2 of 3  Due to Bladder Cancer patient is present today for a BCG treatment. Patient was cleaned and prepped in a sterile fashion with betadine. A 14FR catheter was inserted, urine return was noted 25ml, urine was yellow in color.  50ml of reconstituted BCG was instilled into the bladder. The catheter was then removed. Patient tolerated well, no complications were noted  Performed by: Yalitza Teed, PA-C, Clotilda Eagles, CMA, and Myrtha Rubinstein, CMA  Follow up/ Additional notes: 1 week

## 2024-02-23 ENCOUNTER — Ambulatory Visit: Admitting: Physician Assistant

## 2024-02-23 ENCOUNTER — Telehealth: Payer: Self-pay | Admitting: Student in an Organized Health Care Education/Training Program

## 2024-02-23 VITALS — BP 114/72 | HR 87 | Ht 71.0 in | Wt 216.0 lb

## 2024-02-23 DIAGNOSIS — M47816 Spondylosis without myelopathy or radiculopathy, lumbar region: Secondary | ICD-10-CM

## 2024-02-23 DIAGNOSIS — C679 Malignant neoplasm of bladder, unspecified: Secondary | ICD-10-CM

## 2024-02-23 DIAGNOSIS — M170 Bilateral primary osteoarthritis of knee: Secondary | ICD-10-CM

## 2024-02-23 DIAGNOSIS — G8929 Other chronic pain: Secondary | ICD-10-CM

## 2024-02-23 DIAGNOSIS — G894 Chronic pain syndrome: Secondary | ICD-10-CM

## 2024-02-23 LAB — MICROSCOPIC EXAMINATION

## 2024-02-23 LAB — URINALYSIS, COMPLETE
Bilirubin, UA: NEGATIVE
Glucose, UA: NEGATIVE
Ketones, UA: NEGATIVE
Leukocytes,UA: NEGATIVE
Nitrite, UA: NEGATIVE
Specific Gravity, UA: 1.015 (ref 1.005–1.030)
Urobilinogen, Ur: 0.2 mg/dL (ref 0.2–1.0)
pH, UA: 6 (ref 5.0–7.5)

## 2024-02-23 MED ORDER — BCG LIVE 50 MG IS SUSR
3.2400 mL | Freq: Once | INTRAVESICAL | Status: AC
  Administered 2024-02-23: 81 mg via INTRAVESICAL

## 2024-02-23 MED ORDER — JOURNAVX 50 MG PO TABS: 50.0000 mg | ORAL_TABLET | Freq: Two times a day (BID) | ORAL | 1 refills | Status: DC

## 2024-02-23 NOTE — Progress Notes (Signed)
 BCG Bladder Instillation  BCG # 3 of 3  Due to Bladder Cancer patient is present today for a BCG treatment. Patient was cleaned and prepped in a sterile fashion with betadine. A 14FR catheter was inserted, urine return was noted 75ml, urine was yellow in color.  50ml of reconstituted BCG was instilled into the bladder. The catheter was then removed. Patient tolerated well, no complications were noted  Performed by: Kedron Uno, PA-C and Charisse Blackwell, CMA  Follow up/ Additional notes: Patient will follow up with us  per [facility]; he already has follow up with them scheduled.

## 2024-02-23 NOTE — Telephone Encounter (Signed)
 Please tell Dr Marcelino Mr Kirk wants to go ahead with the Journavx . His insurance will pay for this. Not the [place] .

## 2024-03-02 ENCOUNTER — Ambulatory Visit
Admission: RE | Admit: 2024-03-02 | Discharge: 2024-03-02 | Disposition: A | Discharge status: Home / Self Care | Source: Ambulatory Visit | Attending: Student in an Organized Health Care Education/Training Program | Admitting: Student in an Organized Health Care Education/Training Program

## 2024-03-02 ENCOUNTER — Encounter: Payer: Self-pay | Admitting: Student in an Organized Health Care Education/Training Program

## 2024-03-02 ENCOUNTER — Ambulatory Visit (HOSPITAL_BASED_OUTPATIENT_CLINIC_OR_DEPARTMENT_OTHER): Admitting: Student in an Organized Health Care Education/Training Program

## 2024-03-02 VITALS — BP 174/68 | HR 83 | Temp 97.4°F | Resp 16 | Ht 71.0 in | Wt 217.0 lb

## 2024-03-02 DIAGNOSIS — M170 Bilateral primary osteoarthritis of knee: Secondary | ICD-10-CM | POA: Insufficient documentation

## 2024-03-02 DIAGNOSIS — G8929 Other chronic pain: Secondary | ICD-10-CM | POA: Diagnosis present

## 2024-03-02 DIAGNOSIS — M47816 Spondylosis without myelopathy or radiculopathy, lumbar region: Secondary | ICD-10-CM

## 2024-03-02 DIAGNOSIS — M25562 Pain in left knee: Secondary | ICD-10-CM | POA: Insufficient documentation

## 2024-03-02 DIAGNOSIS — M25561 Pain in right knee: Secondary | ICD-10-CM | POA: Insufficient documentation

## 2024-03-02 DIAGNOSIS — G894 Chronic pain syndrome: Secondary | ICD-10-CM | POA: Diagnosis present

## 2024-03-02 MED ORDER — LIDOCAINE HCL 2 % IJ SOLN
20.0000 mL | Freq: Once | INTRAMUSCULAR | Status: AC
  Administered 2024-03-02: 400 mg

## 2024-03-02 MED ORDER — ROPIVACAINE HCL 2 MG/ML IJ SOLN
18.0000 mL | Freq: Once | INTRAMUSCULAR | Status: AC
  Administered 2024-03-02: 18 mL via PERINEURAL

## 2024-03-02 MED ORDER — ROPIVACAINE HCL 2 MG/ML IJ SOLN
INTRAMUSCULAR | Status: AC
  Filled 2024-03-02: qty 20

## 2024-03-02 MED ORDER — DEXAMETHASONE SODIUM PHOSPHATE 10 MG/ML IJ SOLN
20.0000 mg | Freq: Once | INTRAMUSCULAR | Status: AC
  Administered 2024-03-02: 20 mg

## 2024-03-02 MED ORDER — DEXAMETHASONE SODIUM PHOSPHATE 10 MG/ML IJ SOLN
INTRAMUSCULAR | Status: AC
  Filled 2024-03-02: qty 2

## 2024-03-02 MED ORDER — DIAZEPAM 5 MG PO TABS
ORAL_TABLET | ORAL | Status: AC
  Filled 2024-03-02: qty 1

## 2024-03-02 MED ORDER — LIDOCAINE HCL 2 % IJ SOLN
INTRAMUSCULAR | Status: AC
Start: 2024-03-02 — End: 2024-03-02
  Filled 2024-03-02: qty 20

## 2024-03-02 MED ORDER — JOURNAVX 50 MG PO TABS: 50.0000 mg | ORAL_TABLET | Freq: Two times a day (BID) | ORAL | 1 refills | Status: AC

## 2024-03-02 MED ORDER — DIAZEPAM 5 MG PO TABS
5.0000 mg | ORAL_TABLET | ORAL | Status: AC
  Administered 2024-03-02: 5 mg via ORAL

## 2024-03-02 NOTE — Progress Notes (Signed)
 Safety precautions to be maintained throughout the outpatient stay will include: orient to surroundings, keep bed in low position, maintain call bell within reach at all times, provide assistance with transfer out of bed and ambulation.

## 2024-03-02 NOTE — Patient Instructions (Signed)

## 2024-03-02 NOTE — Progress Notes (Signed)
 PROVIDER NOTE: Interpretation of information contained herein should be left to medically-trained personnel. Specific patient instructions are provided elsewhere under Patient Instructions section of medical record. This document was created in part using STT-dictation technology, any transcriptional errors that may result from this process are unintentional.  Patient: Peter Webster Type: Established DOB: 1940-10-18 MRN: [id number] PCP: [facility]  Service: Procedure DOS: 03/02/2024 Setting: Ambulatory Location: Ambulatory outpatient facility Delivery: Face-to-face Provider: Wallie Sherry, MD Specialty: Interventional Pain Management Specialty designation: 09 Location: Outpatient facility Ref. Prov.: [facility]       Interventional Therapy   Procedure:          Type: Genicular Nerves Block (Superolateral, Superomedial, and Inferomedial Genicular Nerves)  #1  Laterality: Bilateral (-50)  Level: Superior and inferior to the knee joint.  Imaging: Fluoroscopic guidance Anesthesia: Local anesthesia (1-2% Lidocaine ) Anxiolysis:Valium  5 mg PO DOS: 03/02/2024  Performed by: Wallie Sherry, MD  Purpose: Diagnostic/Therapeutic Indications: Chronic knee pain severe enough to impact quality of life or function. Rationale (medical necessity): procedure needed and proper for the diagnosis and/or treatment of Mr. Portnoy medical symptoms and needs. 1. Bilateral chronic knee pain   2. Bilateral primary osteoarthritis of knee   3. Lumbar facet arthropathy   4. Chronic pain syndrome    NAS-11 Pain score:   Pre-procedure: 8 /10   Post-procedure: 8 /10     Target: For Genicular Nerve block(s), the targets are: the superolateral genicular nerve, located in the lateral distal portion of the femoral shaft as it curves to form the lateral epicondyle, in the region of the distal femoral metaphysis; the superomedial genicular nerve, located in the medial distal portion of the  femoral shaft as it curves to form the medial epicondyle; and the inferomedial genicular nerve, located in the medial, proximal portion of the tibial shaft, as it curves to form the medial epicondyle, in the region of the proximal tibial metaphysis.  Location: Superolateral, Superomedial, and Inferomedial aspects of knee joint.  Region: Lateral, Anterior, and Medial aspects of the knee joint, above and below the knee joint proper. Approach: Percutaneous  Type of procedure: Percutaneous perineural nerve block. The genicular nerve block is a motor-sparing technique that anesthetizes the sensory terminal branches innervating the knee joint, resulting in anesthesia of the anterior compartment of the knee. The distribution of anesthesia of each nerve is mostly in the corresponding quadrant.  Neuroanatomy: The superolateral genicular nerve (SLGN) courses around the femur shaft to pass between the vastus lateralis and the lateral epicondyle. It accompanies the superior lateral genicular artery. The superomedial genicular nerve (SMGN) courses around the femur shaft, following the superior medial genicular artery, to pass between the adductor magnus tendon and the medial epicondyle below the vastus medialis. The inferolateral genicular nerve (ILGN) courses around the tibial lateral epicondyle deep to the lateral collateral ligament, following the inferior lateral genicular artery, superior of the fibula head. The inferomedial genicular nerve (IMGN) courses horizontally below the medial collateral ligament between the tibial medial epicondyle and the insertion of the collateral ligament. It accompanies the inferior medial genicular artery. The recurrent peroneal nerve originates in the inferior popliteal region from the common peroneal nerve and courses horizontally around the fibula to pass just inferior of the fibula head and travel superior to the anterolateral tibial epicondyle. It accompanies the recurrent tibial  artery.  Position / Prep / Materials:  Position: Supine, Modified Fowler's position with pillows under the targeted knee(s). The patient is placed in a supine position with the knee slightly  flexed by placing a pillow in the popliteal fossa. Prep solution: ChloraPrep (2% chlorhexidine  gluconate and 70% isopropyl alcohol) Prep Area: Entire knee area, from mid-thigh to mid-shin, lateral, anterior, and medial aspects. Materials:  Tray: Block Needle(s):  Type: Spinal  Gauge (G): 22  Length: 3.5-in  Qty: 3  H&P (Pre-op Assessment):  Mr. Langenderfer is a 83 y.o. (year old), male patient, seen today for interventional treatment. He  has a past surgical history that includes Colonoscopy (2019); bilateral hernia (2018); XI robotic assisted ventral hernia (N/A, 12/18/2021); and Insertion of mesh (12/18/2021). Mr. Sternberg has a current medication list which includes the following prescription(s): acetaminophen , acyclovir, amlodipine, ammonium lactate, calcium carb-cholecalciferol, carbidopa-levodopa, cetirizine, clobetasol ointment, dss, famotidine, fluoxetine, gabapentin , hydrocortisone, lidocaine , losartan, pantoprazole, artificial tears, rosuvastatin, simvastatin, tizanidine, tramadol , triamcinolone ointment, urea, and journavx . His primarily concern today is the Knee Pain (bilateral)  Initial Vital Signs:  Pulse/HCG Rate: 83ECG Heart Rate: (!) 54 Temp: (!) 97.4 F (36.3 C) Resp: 16 BP: (!) 154/69 SpO2: 100 %  BMI: Estimated body mass index is 30.27 kg/m as calculated from the following:   Height as of this encounter: 5' 11 (1.803 m).   Weight as of this encounter: 217 lb (98.4 kg).  Risk Assessment: Allergies: Reviewed. He is allergic to lisinopril.  Allergy Precautions: None required Coagulopathies: Reviewed. None identified.  Blood-thinner therapy: None at this time Active Infection(s): Reviewed. None identified. Mr. Hornback is afebrile  Site Confirmation: Mr. Gaertner was asked to confirm the  procedure and laterality before marking the site Procedure checklist: Completed Consent: Before the procedure and under the influence of no sedative(s), amnesic(s), or anxiolytics, the patient was informed of the treatment options, risks and possible complications. To fulfill our ethical and legal obligations, as recommended by the American Medical Association's Code of Ethics, I have informed the patient of my clinical impression; the nature and purpose of the treatment or procedure; the risks, benefits, and possible complications of the intervention; the alternatives, including doing nothing; the risk(s) and benefit(s) of the alternative treatment(s) or procedure(s); and the risk(s) and benefit(s) of doing nothing. The patient was provided information about the general risks and possible complications associated with the procedure. These may include, but are not limited to: failure to achieve desired goals, infection, bleeding, organ or nerve damage, allergic reactions, paralysis, and death. In addition, the patient was informed of those risks and complications associated to the procedure, such as failure to decrease pain; infection; bleeding; organ or nerve damage with subsequent damage to sensory, motor, and/or autonomic systems, resulting in permanent pain, numbness, and/or weakness of one or several areas of the body; allergic reactions; (i.e.: anaphylactic reaction); and/or death. Furthermore, the patient was informed of those risks and complications associated with the medications. These include, but are not limited to: allergic reactions (i.e.: anaphylactic or anaphylactoid reaction(s)); adrenal axis suppression; blood sugar elevation that in diabetics may result in ketoacidosis or comma; water retention that in patients with history of congestive heart failure may result in shortness of breath, pulmonary edema, and decompensation with resultant heart failure; weight gain; swelling or edema;  medication-induced neural toxicity; particulate matter embolism and blood vessel occlusion with resultant organ, and/or nervous system infarction; and/or aseptic necrosis of one or more joints. Finally, the patient was informed that Medicine is not an exact science; therefore, there is also the possibility of unforeseen or unpredictable risks and/or possible complications that may result in a catastrophic outcome. The patient indicated having understood very clearly. We have given the  patient no guarantees and we have made no promises. Enough time was given to the patient to ask questions, all of which were answered to the patient's satisfaction. Mr. Gallardo has indicated that he wanted to continue with the procedure. Attestation: I, the ordering provider, attest that I have discussed with the patient the benefits, risks, side-effects, alternatives, likelihood of achieving goals, and potential problems during recovery for the procedure that I have provided informed consent. Date  Time: 03/02/2024  1:04 PM  Pre-Procedure Preparation:  Monitoring: As per clinic protocol. Respiration, ETCO2, SpO2, BP, heart rate and rhythm monitor placed and checked for adequate function Safety Precautions: Patient was assessed for positional comfort and pressure points before starting the procedure. Time-out: I initiated and conducted the Time-out before starting the procedure, as per protocol. The patient was asked to participate by confirming the accuracy of the Time Out information. Verification of the correct person, site, and procedure were performed and confirmed by me, the nursing staff, and the patient. Time-out conducted as per Joint Commission's Universal Protocol (UP.01.01.01). Time: 1334 Start Time: 1334 hrs.  Description/Narrative of Procedure:          Rationale (medical necessity): procedure needed and proper for the diagnosis and/or treatment of the patient's medical symptoms and needs. Procedural  Technique Safety Precautions: Aspiration looking for blood return was conducted prior to all injections. At no point did we inject any substances, as a needle was being advanced. No attempts were made at seeking any paresthesias. Safe injection practices and needle disposal techniques used. Medications properly checked for expiration dates. SDV (single dose vial) medications used. Description of the Procedure: Protocol guidelines were followed. The patient was assisted into a comfortable position. The target area was identified and the area prepped in the usual manner. Skin & deeper tissues infiltrated with local anesthetic. Appropriate amount of time allowed to pass for local anesthetics to take effect. The procedure needles were then advanced to the target area. Proper needle placement secured. Negative aspiration confirmed. Solution injected in intermittent fashion, asking for systemic symptoms every 0.5cc of injectate. The needles were then removed and the area cleansed, making sure to leave some of the prepping solution back to take advantage of its long term bactericidal properties.  3 cc of nerve block solution injected for each of the genicular nerves             Vitals:   03/02/24 1309 03/02/24 1330 03/02/24 1335 03/02/24 1340  BP: (!) 149/66 (!) 153/67 (!) 166/68 (!) 174/68  Pulse: 83     Resp:  15 14 16   Temp: (!) 97.4 F (36.3 C)     SpO2: 100% 100% 99% 100%  Weight: 217 lb (98.4 kg)     Height: 5' 11 (1.803 m)        Start Time: 1334 hrs. End Time:   hrs.  Imaging Guidance (Non-Spinal):          Type of Imaging Technique: Fluoroscopy Guidance (Non-Spinal) Indication(s): Fluoroscopy guidance for needle placement to enhance accuracy in procedures requiring precise needle localization for targeted delivery of medication in or near specific anatomical locations not easily accessible without such real-time imaging assistance. Exposure Time: Please see nurses notes. Contrast:  None used. Fluoroscopic Guidance: I was personally present during the use of fluoroscopy. Tunnel Vision Technique used to obtain the best possible view of the target area. Parallax error corrected before commencing the procedure. Direction-depth-direction technique used to introduce the needle under continuous pulsed fluoroscopy. Once target was  reached, antero-posterior, oblique, and lateral fluoroscopic projection used confirm needle placement in all planes. Images permanently stored in EMR. Interpretation: No contrast injected. I personally interpreted the imaging intraoperatively. Adequate needle placement confirmed in multiple planes. Permanent images saved into the patient's record.  Post-operative Assessment:  Post-procedure Vital Signs:  Pulse/HCG Rate: 8363 Temp: (!) 97.4 F (36.3 C) Resp: 16 BP: (!) 174/68 SpO2: 100 %  EBL: None  Complications: No immediate post-treatment complications observed by team, or reported by patient.  Note: The patient tolerated the entire procedure well. A repeat set of vitals were taken after the procedure and the patient was kept under observation following institutional policy, for this type of procedure. Post-procedural neurological assessment was performed, showing return to baseline, prior to discharge. The patient was provided with post-procedure discharge instructions, including a section on how to identify potential problems. Should any problems arise concerning this procedure, the patient was given instructions to immediately contact us , at any time, without hesitation. In any case, we plan to contact the patient by telephone for a follow-up status report regarding this interventional procedure.  Comments:  No additional relevant information.  Plan of Care (POC)  Orders:  Orders Placed This Encounter  Procedures   DG PAIN CLINIC C-ARM 1-60 MIN NO REPORT    Intraoperative interpretation by procedural physician at [facility].     Standing Status:   Standing    Number of Occurrences:   1    Reason for exam::   Assistance in needle guidance and placement for procedures requiring needle placement in or near specific anatomical locations not easily accessible without such assistance.    Tramadol  50 mg BID prn.   Medications ordered for procedure: Meds ordered this encounter  Medications   lidocaine  (XYLOCAINE ) 2 % (with pres) injection 400 mg   diazepam  (VALIUM ) tablet 5 mg    Make sure Flumazenil is available in the pyxis when using this medication. If oversedation occurs, administer 0.2 mg IV over 15 sec. If after 45 sec no response, administer 0.2 mg again over 1 min; may repeat at 1 min intervals; not to exceed 4 doses (1 mg)   ropivacaine  (PF) 2 mg/mL (0.2%) (NAROPIN ) injection 18 mL   dexamethasone  (DECADRON ) injection 20 mg   Suzetrigine  (JOURNAVX ) 50 MG TABS    Sig: Take 50 mg by mouth every 12 (twelve) hours for 15 days.    Dispense:  30 tablet    Refill:  1   Medications administered: We administered lidocaine , diazepam , ropivacaine  (PF) 2 mg/mL (0.2%), and dexamethasone .  See the medical record for exact dosing, route, and time of administration.    B/L Hyalgan 12/24/22, 04/01/2023, 09/23/23.  Left L4-L5 ESI and bilateral L3, L4, L5 facet medial branch nerve block 04/01/2023, 09/23/23. B/L GNB 03/02/24      Follow-up plan:   Return in about 4 weeks (around 03/30/2024) for PPE, F2F(post GNB and Journavax).     Recent Visits Date Type Provider Dept  01/26/24 Office Visit Marcelino Nurse, MD [facility]  Showing recent visits within past 90 days and meeting all other requirements Today's Visits Date Type Provider Dept  03/02/24 Procedure visit Marcelino Nurse, MD [facility]  Showing today's visits and meeting all other requirements Future Appointments Date Type Provider Dept  03/15/24 Appointment Marcelino Nurse, MD [facility]  Showing future appointments within next 90 days  and meeting all other requirements   Disposition: Discharge home  Discharge (Date  Time): 03/02/2024; 1348 hrs.  Primary Care Physician: [facility] Location: [facility] Note by: Wallie Sherry, MD (TTS technology used. I apologize for any typographical errors that were not detected and corrected.) Date: 03/02/2024; Time: 1:57 PM  Disclaimer:  Medicine is not an Visual merchandiser. The only guarantee in medicine is that nothing is guaranteed. It is important to note that the decision to proceed with this intervention was based on the information collected from the patient. The Data and conclusions were drawn from the patient's questionnaire, the interview, and the physical examination. Because the information was provided in large part by the patient, it cannot be guaranteed that it has not been purposely or unconsciously manipulated. Every effort has been made to obtain as much relevant data as possible for this evaluation. It is important to note that the conclusions that lead to this procedure are derived in large part from the available data. Always take into account that the treatment will also be dependent on availability of resources and existing treatment guidelines, considered by other Pain Management Practitioners as being common knowledge and practice, at the time of the intervention. For Medico-Legal purposes, it is also important to point out that variation in procedural techniques and pharmacological choices are the acceptable norm. The indications, contraindications, technique, and results of the above procedure should only be interpreted and judged by a Board-Certified Interventional Pain Specialist with extensive familiarity and expertise in the same exact procedure and technique.

## 2024-03-03 ENCOUNTER — Telehealth: Payer: Self-pay

## 2024-03-03 NOTE — Telephone Encounter (Signed)
Post procedure follow up.  Patient states he is doing great.

## 2024-03-11 DIAGNOSIS — G20A1 Parkinson's disease without dyskinesia, without mention of fluctuations: Secondary | ICD-10-CM | POA: Insufficient documentation

## 2024-03-11 DIAGNOSIS — R1013 Epigastric pain: Secondary | ICD-10-CM | POA: Insufficient documentation

## 2024-03-11 DIAGNOSIS — K219 Gastro-esophageal reflux disease without esophagitis: Secondary | ICD-10-CM | POA: Insufficient documentation

## 2024-03-11 DIAGNOSIS — D219 Benign neoplasm of connective and other soft tissue, unspecified: Secondary | ICD-10-CM | POA: Insufficient documentation

## 2024-03-11 DIAGNOSIS — G629 Polyneuropathy, unspecified: Secondary | ICD-10-CM | POA: Insufficient documentation

## 2024-03-15 ENCOUNTER — Ambulatory Visit: Admitting: Student in an Organized Health Care Education/Training Program

## 2024-03-16 ENCOUNTER — Ambulatory Visit: Admitting: Student in an Organized Health Care Education/Training Program

## 2024-03-17 ENCOUNTER — Ambulatory Visit
Admission: RE | Admit: 2024-03-17 | Discharge: 2024-03-17 | Disposition: A | Discharge status: Home / Self Care | Source: Ambulatory Visit | Attending: Student in an Organized Health Care Education/Training Program | Admitting: Student in an Organized Health Care Education/Training Program

## 2024-03-17 ENCOUNTER — Encounter: Payer: Self-pay | Admitting: Student in an Organized Health Care Education/Training Program

## 2024-03-17 ENCOUNTER — Ambulatory Visit
Attending: Student in an Organized Health Care Education/Training Program | Admitting: Student in an Organized Health Care Education/Training Program

## 2024-03-17 VITALS — BP 139/62 | HR 44 | Temp 97.6°F | Resp 18 | Ht 71.0 in | Wt 211.0 lb

## 2024-03-17 DIAGNOSIS — M25571 Pain in right ankle and joints of right foot: Secondary | ICD-10-CM

## 2024-03-17 DIAGNOSIS — M25572 Pain in left ankle and joints of left foot: Secondary | ICD-10-CM | POA: Insufficient documentation

## 2024-03-17 DIAGNOSIS — G894 Chronic pain syndrome: Secondary | ICD-10-CM | POA: Insufficient documentation

## 2024-03-17 DIAGNOSIS — M47816 Spondylosis without myelopathy or radiculopathy, lumbar region: Secondary | ICD-10-CM

## 2024-03-17 NOTE — Progress Notes (Signed)
 PROVIDER NOTE: Interpretation of information contained herein should be left to medically-trained personnel. Specific patient instructions are provided elsewhere under Patient Instructions section of medical record. This document was created in part using AI and STT-dictation technology, any transcriptional errors that may result from this process are unintentional.  Patient: Peter Webster  Service: E/M   PCP: [facility]  DOB: 21-Nov-1940  DOS: 03/17/2024  Provider: Wallie Sherry, MD  MRN: [id number]  Delivery: Face-to-face  Specialty: Interventional Pain Management  Type: Established Patient  Setting: Ambulatory outpatient facility  Specialty designation: 09  Referring Prov.: [facility]  Location: Outpatient office facility       History of present illness (HPI) [name], a 83 y.o. year old male, is here today because of his Lumbar facet arthropathy [M47.816]. [name] primary complain today is Back Pain (low)  Pertinent problems: [name] has Multiple myeloma not having achieved remission (HCC); Thrombocytopenia (HCC); Wedge compression fracture of t11-T12 vertebra, sequela; Chronic bilateral low back pain with left-sided sciatica; Lumbar spondylosis; Lumbar facet arthropathy; Chronic radicular lumbar pain; Chronic pain syndrome; Compression fracture of thoracic vertebra (HCC); and Thoracic spondylosis on their pertinent problem list.  Pain Assessment: Severity of Chronic pain is reported as a 8 /10. Location: Back Lower/weakness in both legs. Onset: More than a month ago. Quality: Constant, Aching. Timing: Constant. Modifying factor(s): tramadol . Vitals:  height is 5' 11 (1.803 m) and weight is 211 lb (95.7 kg). His temperature is 97.6 F (36.4 C). His blood pressure is 139/62 and his pulse is 44 (abnormal). His respiration is 18 and oxygen saturation is 100%.  BMI: Estimated body mass index is 29.43 kg/m as calculated from the following:   Height as of  this encounter: 5' 11 (1.803 m).   Weight as of this encounter: 211 lb (95.7 kg).  Last encounter: 01/26/2024. Last procedure: 03/02/2024.  Reason for encounter:   Post-Procedure Evaluation   Type: Genicular Nerves Block (Superolateral, Superomedial, and Inferomedial Genicular Nerves)  #1  Laterality: Bilateral (-50)  Level: Superior and inferior to the knee joint.  Imaging: Fluoroscopic guidance Anesthesia: Local anesthesia (1-2% Lidocaine ) Anxiolysis:Valium  5 mg PO DOS: 03/02/2024  Performed by: Wallie Sherry, MD  Purpose: Diagnostic/Therapeutic Indications: Chronic knee pain severe enough to impact quality of life or function. Rationale (medical necessity): procedure needed and proper for the diagnosis and/or treatment of [name] medical symptoms and needs. 1. Bilateral chronic knee pain   2. Bilateral primary osteoarthritis of knee   3. Lumbar facet arthropathy   4. Chronic pain syndrome    NAS-11 Pain score:   Pre-procedure: 8 /10   Post-procedure: 8 /10     Effectiveness:  Initial hour after procedure: 100 %  Subsequent 4-6 hours post-procedure: 100 %  Analgesia past initial 6 hours: 75 % (ongoing)  Ongoing improvement:  Analgesic:  75%    UDS:  Summary  Date Value Ref Range Status  11/20/2021 Note  Final    Comment:    ==================================================================== Compliance Drug Analysis, Ur ==================================================================== Test                             Result       Flag       Units  Drug Present and Declared for Prescription Verification   Acetaminophen                   PRESENT      EXPECTED  Drug Absent but Declared  for Prescription Verification   Gabapentin                      Not Detected UNEXPECTED   Tizanidine                     Not Detected UNEXPECTED    Tizanidine, as indicated in the declared medication list, is not    always detected even when used as directed.     Prochlorperazine               Not Detected UNEXPECTED   Lidocaine                       Not Detected UNEXPECTED    Lidocaine , as indicated in the declared medication list, is not    always detected even when used as directed.  ==================================================================== Test                      Result    Flag   Units      Ref Range   Creatinine              168              mg/dL      >=79 ==================================================================== Declared Medications:  The flagging and interpretation on this report are based on the  following declared medications.  Unexpected results may arise from  inaccuracies in the declared medications.   **Note: The testing scope of this panel includes these medications:   Gabapentin  (Neurontin )  Prochlorperazine (Compazine)   **Note: The testing scope of this panel does not include small to  moderate amounts of these reported medications:   Acetaminophen  (Tylenol )  Lidocaine  (Xylocaine )  Tizanidine   **Note: The testing scope of this panel does not include the  following reported medications:   Acyclovir  Amlodipine (Norvasc)  Calcium  Capsaicin  Cetirizine (Zyrtec)  Cholecalciferol  Clobetasol  Cyanocobalamin  Docusate  Hydrocortisone  Omeprazole  Sildenafil (Viagra)  Simvastatin (Zocor)  Topical  Triamcinolone (Kenalog) ==================================================================== For clinical consultation, please call [phone]. ====================================================================     No results found for: CBDTHCR No results found for: D8THCCBX No results found for: D9THCCBX  ROS  Constitutional: Denies any fever or chills Gastrointestinal: No reported hemesis, hematochezia, vomiting, or acute GI distress Musculoskeletal: +low back pain Neurological: No reported episodes of acute onset apraxia, aphasia, dysarthria, agnosia, amnesia, paralysis,  loss of coordination, or loss of consciousness  Medication Review  Calcium Carb-Cholecalciferol, DSS, FLUoxetine, Suzetrigine , acetaminophen , acyclovir, amLODipine, ammonium lactate, artificial tears, carbidopa-levodopa, cetirizine, clobetasol ointment, famotidine, gabapentin , hydrocortisone, lidocaine , losartan, pantoprazole, rosuvastatin, simvastatin, tiZANidine, traMADol , triamcinolone ointment, and urea  History Review  Allergy: Mr. Homann is allergic to lisinopril. Drug: Mr. Gertz  reports no history of drug use. Alcohol:  reports current alcohol use of about 1.0 standard drink of alcohol per week. Tobacco:  reports that he has never smoked. He has never used smokeless tobacco. Social: Mr. Ciavarella  reports that he has never smoked. He has never used smokeless tobacco. He reports current alcohol use of about 1.0 standard drink of alcohol per week. He reports that he does not use drugs. Medical:  has a past medical history of Acute kidney failure (HCC), Allergy, Arthritis, Bladder cancer (HCC), Chronic pain syndrome, DDD (degenerative disc disease), lumbar, ED (erectile dysfunction), Elevated PSA, GERD (gastroesophageal reflux disease), Headache, Hernia of anterior abdominal wall, Hyperlipidemia, Hypertension, Multiple myeloma not having achieved  remission (HCC), Right inguinal hernia, Sleep apnea, Thrombocytopenia (HCC), and Wedge compression fracture of t11-T12 vertebra, sequela. Surgical: Mr. Fester  has a past surgical history that includes Colonoscopy (2019); bilateral hernia (2018); XI robotic assisted ventral hernia (N/A, 12/18/2021); and Insertion of mesh (12/18/2021). Family: family history includes Cervical cancer in his mother; Stroke in his mother.  Laboratory Chemistry Profile   Renal Lab Results  Component Value Date   BUN 22 11/20/2021   CREATININE 1.96 (H) 11/20/2021   BCR 11 11/20/2021    Hepatic Lab Results  Component Value Date   AST 18 11/20/2021   ALBUMIN 4.6  11/20/2021   ALKPHOS 82 11/20/2021    Electrolytes Lab Results  Component Value Date   NA 142 11/20/2021   K 3.7 11/20/2021   CL 104 11/20/2021   CALCIUM 10.1 11/20/2021   MG 2.0 11/20/2021    Bone Lab Results  Component Value Date   25OHVITD1 38 11/20/2021   25OHVITD2 1.0 11/20/2021   25OHVITD3 37 11/20/2021    Inflammation (CRP: Acute Phase) (ESR: Chronic Phase) No results found for: CRP, ESRSEDRATE, LATICACIDVEN       Note: Above Lab results reviewed.  Recent Imaging Review   Narrative & Impression  CLINICAL DATA:  Low back pain, increased fracture risk. Multiple recent falls now with progressive back pain and left abdominal pain.   EXAM: CT LUMBAR SPINE WITHOUT CONTRAST   TECHNIQUE: Multidetector CT imaging of the lumbar spine was performed without intravenous contrast administration. Multiplanar CT image reconstructions were also generated.   RADIATION DOSE REDUCTION: This exam was performed according to the departmental dose-optimization program which includes automated exposure control, adjustment of the mA and/or kV according to patient size and/or use of iterative reconstruction technique.   COMPARISON:  CT abdomen pelvis 09/17/2018   FINDINGS: Segmentation: 5 lumbar type vertebrae.   Alignment: 3 mm anterolisthesis L3-4 and 4 mm anterolisthesis L4-5, likely degenerative in nature. Otherwise normal lumbar lordosis.   Vertebrae: There is an acute coronally oriented fracture involving both the superior and inferior endplates of L4 best seen on sagittal image # 39/6 and axial image # 86/3 with approximately 30-40% loss of height centrally. The posterior wall of the L4 vertebral body is intact and there is no retropulsion. Posterior elements are intact. The osseous structures are diffusely osteopenic. Remote appearing superior endplate fractures of T12 and L3 are noted with 30-40% loss of height centrally. The osseous structures are  diffusely osteopenic. No focal lytic or blastic bone lesions are identified.   Paraspinal and other soft tissues: Mild paravertebral infiltration is seen at L4 in keeping with interstitial hemorrhage/edema. No loculated paraspinal fluid collections are identified. Mild atherosclerotic calcification within the visualized abdominal aorta. Multiple simple cortical cysts are seen within the kidneys bilaterally for which no follow-up imaging is recommended.   Disc levels: Sagittal reformats demonstrates intervertebral disc space narrowing and endplate remodeling at L5-S1 in keeping with changes of advanced degenerative disc disease. Axial images demonstrate:   T12-L1 mild bilateral facet arthrosis. No neuroforaminal narrowing. No canal stenosis.   L1-L2: Mild bilateral facet arthrosis. Mild laminar hypertrophy. No significant neuroforaminal narrowing. No canal stenosis.   L2-L3: Broad-based disc bulge in combination with osteophytic ridging of the posterosuperior aspect of L3 and moderate bilateral facet arthrosis with associated laminar hypertrophy results in moderate central canal stenosis. No significant neuroforaminal narrowing.   L3-4: Broad-based disc bulge. Moderate right and severe left facet arthrosis with associated laminar hypertrophy results in marked central canal stenosis. No  facet hypertrophy results in moderate left neuroforaminal narrowing with possible abutment of the exiting left L3 nerve root. There is severe impingement of the lateral recesses bilaterally with possible impingement of the crossing L4 nerve roots bilaterally.   L4-5: Advanced bilateral facet arthrosis with associated laminar hypertrophy in Bard based disc bulge results in severe central canal stenosis. No significant neuroforaminal narrowing. Moderate impingement of the lateral recesses.   L5-S1: Disc space narrowing, osteophytic ridging, and moderate bilateral facet arthrosis with laminar  hypertrophy results in moderate to severe central canal stenosis. Moderate bilateral neuroforaminal narrowing with possible abutment of the exiting L5 nerve roots bilaterally by osteophytic spurring within the foraminal zones bilaterally.   IMPRESSION: 1. Acute coronally oriented fracture involving both the superior and inferior endplates of L4 with approximately 30-40% loss of height centrally. No retropulsion. Posterior wall of the L4 vertebral body is intact and there is no retropulsion. Posterior elements are intact. 2. Remote appearing superior endplate fractures of T12 and L3 with 30-40% loss of height centrally. 3. Multilevel degenerative disc and degenerative joint disease resulting in multilevel central canal and neuroforaminal stenosis as described above. 4. Aortic atherosclerosis.     Note: Reviewed        Physical Exam  Vitals: BP 139/62   Pulse (!) 44   Temp 97.6 F (36.4 C)   Resp 18   Ht 5' 11 (1.803 m)   Wt 211 lb (95.7 kg)   SpO2 100%   BMI 29.43 kg/m  BMI: Estimated body mass index is 29.43 kg/m as calculated from the following:   Height as of this encounter: 5' 11 (1.803 m).   Weight as of this encounter: 211 lb (95.7 kg). Ideal: Ideal body weight: 75.3 kg (166 lb 0.1 oz) Adjusted ideal body weight: 83.5 kg (184 lb 0.1 oz) General appearance: Well nourished, well developed, and well hydrated. In no apparent acute distress Mental status: Alert, oriented x 3 (person, place, & time)       Respiratory: No evidence of acute respiratory distress Eyes: PERLA  Low back pain, pain with lumbar facet loading and lumbar extension Assessment   Diagnosis Status  1. Lumbar facet arthropathy   2. Lumbar spondylosis   3. Acute bilateral ankle pain   4. Chronic pain syndrome    Having a Flare-up Having a Flare-up Having a Flare-up   Updated Problems: Problem  Lumbar Spondylosis  Acute Bilateral Ankle Pain    Plan of Care  Assessment and Plan     Chronic low back pain with lumbar spondylosis   He experiences chronic low back pain umbar spondylosis. Previous nerve blocks in October 2023 provided significant relief which were diagnostic bilateral L3, L4, L5 medial branch nerve block.  Repeat medial branch nerve block #2 then consider RFA.  Chronic left knee pain   His chronic left knee pain is effectively managed with genicular nerve block.  Continue to monitor, consider genicular nerve RFA in future  Chronic bilateral ankle pain   He has chronic bilateral ankle pain with no recent imaging. Good footwear with orthopedic inserts alleviates some discomfort. Order x-rays of both ankles and review the results at the next appointment.         Orders:  Orders Placed This Encounter  Procedures   LUMBAR FACET(MEDIAL BRANCH NERVE BLOCK) MBNB    Diagnosis: Lumbar Facet Syndrome (M47.816); Lumbosacral Facet Syndrome (M47.817); Lumbar Facet Joint Pain (M54.59) Medical Necessity Statement: 1.Severe chronic axial low back pain causing functional impairment documented by ongoing  pain scale assessments. 2.Pain present for longer than 3 months (Chronic) documented to have failed noninvasive conservative therapies. 3.Absence of untreated radiculopathy. 4.There is no radiological evidence of untreated fractures, tumor, infection, or deformity.  Physical Examination Findings: Positive Kemp Maneuver: (Y)  Positive Lumbar Hyperextension-Rotation provocative test: (Y)    Standing Status:   Future    Expiration Date:   06/16/2024    Scheduling Instructions:     Procedure: Lumbar facet Block     Type: Medial Branch Block     Side: Bilateral     Purpose: Diagnostic Radiologic Mapping     Level(s):  L3-4, L4-5,  by Fluoroscopic Mapping Facets ( L3, L4, L5,Medial Branch)     Sedation: Patient's choice.     Timeframe: As soon as schedule allows.    Where will this procedure be performed?:   [facility]   DG Ankle 2 Views Left    Standing  Status:   Future    Expiration Date:   03/17/2025    Reason for Exam (SYMPTOM  OR DIAGNOSIS REQUIRED):   ankle pain    Preferred imaging location?:   [facility]   DG Ankle 2 Views Right    Standing Status:   Future    Expiration Date:   03/17/2025    Reason for Exam (SYMPTOM  OR DIAGNOSIS REQUIRED):   ankle pain    Preferred imaging location?:   [facility]     B/L Hyalgan 12/24/22, 04/01/2023, 09/23/23.  Left L4-L5 ESI and bilateral L3, L4, L5 facet medial branch nerve block 04/01/2023, 09/23/23. B/L GNB 03/02/24      Return in about 18 days (around 04/04/2024) for B/L L3,4,5 MBNB #2.    Recent Visits Date Type Provider Dept  03/02/24 Procedure visit Marcelino Nurse, MD [facility]  01/26/24 Office Visit Marcelino Nurse, MD [facility]  Showing recent visits within past 90 days and meeting all other requirements Today's Visits Date Type Provider Dept  03/17/24 Office Visit Marcelino Nurse, MD [facility]  Showing today's visits and meeting all other requirements Future Appointments No visits were found meeting these conditions. Showing future appointments within next 90 days and meeting all other requirements  I discussed the assessment and treatment plan with the patient. The patient was provided an opportunity to ask questions and all were answered. The patient agreed with the plan and demonstrated an understanding of the instructions.  Patient advised to call back or seek an in-person evaluation if the symptoms or condition worsens.  Duration of encounter: .  Total time on encounter, as per AMA guidelines included both the face-to-face and non-face-to-face time personally spent by the physician and/or other qualified health care professional(s) on the day of the encounter (includes time in activities that require the physician or other qualified health care professional and does not include time in activities normally performed by clinical  staff). Physician's time may include the following activities when performed: Preparing to see the patient (e.g., pre-charting review of records, searching for previously ordered imaging, lab work, and nerve conduction tests) Review of prior analgesic pharmacotherapies. Reviewing PMP Interpreting ordered tests (e.g., lab work, imaging, nerve conduction tests) Performing post-procedure evaluations, including interpretation of diagnostic procedures Obtaining and/or reviewing separately obtained history Performing a medically appropriate examination and/or evaluation Counseling and educating the patient/family/caregiver Ordering medications, tests, or procedures Referring and communicating with other health care professionals (when not separately reported) Documenting clinical information in the electronic or other health record Independently interpreting  results (not separately reported) and communicating results to the patient/ family/caregiver Care coordination (not separately reported)  Note by: Wallie Sherry, MD (TTS and AI technology used. I apologize for any typographical errors that were not detected and corrected.) Date: 03/17/2024; Time: 1:57 PM

## 2024-03-17 NOTE — Progress Notes (Signed)
 Safety precautions to be maintained throughout the outpatient stay will include: orient to surroundings, keep bed in low position, maintain call bell within reach at all times, provide assistance with transfer out of bed and ambulation.

## 2024-03-17 NOTE — Patient Instructions (Signed)

## 2024-03-21 ENCOUNTER — Other Ambulatory Visit: Payer: Self-pay | Admitting: *Deleted

## 2024-03-21 ENCOUNTER — Telehealth: Payer: Self-pay | Admitting: Student in an Organized Health Care Education/Training Program

## 2024-03-21 DIAGNOSIS — G894 Chronic pain syndrome: Secondary | ICD-10-CM

## 2024-03-21 DIAGNOSIS — M170 Bilateral primary osteoarthritis of knee: Secondary | ICD-10-CM

## 2024-03-21 DIAGNOSIS — G8929 Other chronic pain: Secondary | ICD-10-CM

## 2024-03-21 DIAGNOSIS — M47816 Spondylosis without myelopathy or radiculopathy, lumbar region: Secondary | ICD-10-CM

## 2024-03-21 MED ORDER — JOURNAVX 50 MG PO TABS: 50.0000 mg | ORAL_TABLET | Freq: Two times a day (BID) | ORAL | 1 refills | Status: DC

## 2024-03-21 NOTE — Telephone Encounter (Signed)
 Patient called stated that he needs Journevx to be send to [facility] in [place]. Please give patient a call. TY

## 2024-03-21 NOTE — Telephone Encounter (Signed)
 Rx sent.

## 2024-03-21 NOTE — Telephone Encounter (Signed)
 Refill med request sent to [facility]

## 2024-03-22 ENCOUNTER — Telehealth: Payer: Self-pay | Admitting: Student in an Organized Health Care Education/Training Program

## 2024-03-22 ENCOUNTER — Other Ambulatory Visit: Payer: Self-pay | Admitting: *Deleted

## 2024-03-22 ENCOUNTER — Telehealth: Payer: Self-pay | Admitting: *Deleted

## 2024-03-22 DIAGNOSIS — M47816 Spondylosis without myelopathy or radiculopathy, lumbar region: Secondary | ICD-10-CM

## 2024-03-22 DIAGNOSIS — G8929 Other chronic pain: Secondary | ICD-10-CM

## 2024-03-22 DIAGNOSIS — G894 Chronic pain syndrome: Secondary | ICD-10-CM

## 2024-03-22 DIAGNOSIS — M170 Bilateral primary osteoarthritis of knee: Secondary | ICD-10-CM

## 2024-03-22 MED ORDER — JOURNAVX 50 MG PO TABS: 50.0000 mg | ORAL_TABLET | Freq: Two times a day (BID) | ORAL | 1 refills | Status: DC

## 2024-03-22 NOTE — Telephone Encounter (Signed)
 Returned patient phone call;  sig instructions reviewed.  He states he was unable to get his medication from pharmacy,  he is under the impression that he to take once daily and the sig reads every 12 hours.  There is no prn noted.

## 2024-03-22 NOTE — Telephone Encounter (Signed)
 PT called stated that when he spoke with the pharmacy he was told. The directions  for Journevx stated that he should take 2 times a day, it should be one time a day. Please give patient a call and pharmacy. TY

## 2024-03-23 NOTE — Telephone Encounter (Signed)
 Reached out to [facility] re; journavx  Rx.  The problem is that his referral has run out and there is a referral request but it has not been approved yet, so they are unable to fill any medications at this time.  2nd, Journavx  is non-formulary and will have to go through that process once they get a new Rx.  I will call patient and let him know what I found out.    Referral expired, waiting on new referral, will need to call us  when referral is back in place so that we can send new Rx.

## 2024-05-18 ENCOUNTER — Ambulatory Visit
Attending: Student in an Organized Health Care Education/Training Program | Admitting: Student in an Organized Health Care Education/Training Program

## 2024-05-23 ENCOUNTER — Telehealth: Payer: Self-pay | Admitting: Student in an Organized Health Care Education/Training Program

## 2024-05-23 NOTE — Telephone Encounter (Signed)
 Patient called to reschedule missed appt. He was so sick last week he just did not remember to call. I do not have anywhere to put him with Dr Marcelino. Please check with Dr Marcelino and Dr Tanya to see if Dr Tanya is willing to do this procedure - Clinic B/L L3,4,5 MBNB #2. thanks

## 2024-05-31 ENCOUNTER — Ambulatory Visit
Admission: RE | Admit: 2024-05-31 | Discharge: 2024-05-31 | Disposition: A | Discharge status: Home / Self Care | Source: Ambulatory Visit | Attending: Pain Medicine | Admitting: Pain Medicine

## 2024-05-31 ENCOUNTER — Encounter: Payer: Self-pay | Admitting: Pain Medicine

## 2024-05-31 ENCOUNTER — Ambulatory Visit (HOSPITAL_BASED_OUTPATIENT_CLINIC_OR_DEPARTMENT_OTHER): Admitting: Pain Medicine

## 2024-05-31 VITALS — BP 140/93 | HR 91 | Temp 97.0°F | Resp 17 | Ht 71.0 in | Wt 217.0 lb

## 2024-05-31 DIAGNOSIS — M5459 Other low back pain: Secondary | ICD-10-CM | POA: Insufficient documentation

## 2024-05-31 DIAGNOSIS — G4733 Obstructive sleep apnea (adult) (pediatric): Secondary | ICD-10-CM | POA: Insufficient documentation

## 2024-05-31 DIAGNOSIS — M545 Low back pain, unspecified: Secondary | ICD-10-CM | POA: Insufficient documentation

## 2024-05-31 DIAGNOSIS — L309 Dermatitis, unspecified: Secondary | ICD-10-CM | POA: Insufficient documentation

## 2024-05-31 DIAGNOSIS — M47816 Spondylosis without myelopathy or radiculopathy, lumbar region: Secondary | ICD-10-CM | POA: Insufficient documentation

## 2024-05-31 DIAGNOSIS — G8929 Other chronic pain: Secondary | ICD-10-CM | POA: Diagnosis present

## 2024-05-31 DIAGNOSIS — C678 Malignant neoplasm of overlapping sites of bladder: Secondary | ICD-10-CM | POA: Insufficient documentation

## 2024-05-31 DIAGNOSIS — N529 Male erectile dysfunction, unspecified: Secondary | ICD-10-CM | POA: Insufficient documentation

## 2024-05-31 DIAGNOSIS — M2390 Unspecified internal derangement of unspecified knee: Secondary | ICD-10-CM | POA: Insufficient documentation

## 2024-05-31 DIAGNOSIS — N1832 Chronic kidney disease, stage 3b: Secondary | ICD-10-CM | POA: Insufficient documentation

## 2024-05-31 DIAGNOSIS — R262 Difficulty in walking, not elsewhere classified: Secondary | ICD-10-CM | POA: Insufficient documentation

## 2024-05-31 DIAGNOSIS — W01198A Fall on same level from slipping, tripping and stumbling with subsequent striking against other object, initial encounter: Secondary | ICD-10-CM | POA: Insufficient documentation

## 2024-05-31 DIAGNOSIS — H6120 Impacted cerumen, unspecified ear: Secondary | ICD-10-CM | POA: Insufficient documentation

## 2024-05-31 DIAGNOSIS — Z7409 Other reduced mobility: Secondary | ICD-10-CM | POA: Insufficient documentation

## 2024-05-31 DIAGNOSIS — Z779 Other contact with and (suspected) exposures hazardous to health: Secondary | ICD-10-CM | POA: Insufficient documentation

## 2024-05-31 DIAGNOSIS — H04123 Dry eye syndrome of bilateral lacrimal glands: Secondary | ICD-10-CM | POA: Insufficient documentation

## 2024-05-31 MED ORDER — LIDOCAINE HCL 2 % IJ SOLN
INTRAMUSCULAR | Status: AC
  Filled 2024-05-31: qty 20

## 2024-05-31 MED ORDER — TRIAMCINOLONE ACETONIDE 40 MG/ML IJ SUSP
INTRAMUSCULAR | Status: AC
  Filled 2024-05-31: qty 2

## 2024-05-31 MED ORDER — TRIAMCINOLONE ACETONIDE 40 MG/ML IJ SUSP
80.0000 mg | Freq: Once | INTRAMUSCULAR | Status: AC
  Administered 2024-05-31: 80 mg

## 2024-05-31 MED ORDER — ROPIVACAINE HCL 2 MG/ML IJ SOLN
INTRAMUSCULAR | Status: AC
  Filled 2024-05-31: qty 20

## 2024-05-31 MED ORDER — LIDOCAINE HCL 2 % IJ SOLN
20.0000 mL | Freq: Once | INTRAMUSCULAR | Status: AC
  Administered 2024-05-31: 400 mg

## 2024-05-31 MED ORDER — ROPIVACAINE HCL 2 MG/ML IJ SOLN
18.0000 mL | Freq: Once | INTRAMUSCULAR | Status: AC
  Administered 2024-05-31: 18 mL via PERINEURAL

## 2024-05-31 MED ORDER — PENTAFLUOROPROP-TETRAFLUOROETH EX AERO
INHALATION_SPRAY | Freq: Once | CUTANEOUS | Status: AC
  Administered 2024-05-31: 30 via TOPICAL

## 2024-05-31 NOTE — Patient Instructions (Addendum)
Facet Blocks °Patient Information ° °Description: The facets are joints in the spine between the vertebrae.  Like any joints in the body, facets can become irritated and painful.  Arthritis can also effect the facets.  By injecting steroids and local anesthetic in and around these joints, we can temporarily block the nerve supply to them.  Steroids act directly on irritated nerves and tissues to reduce selling and inflammation which often leads to decreased pain.  Facet blocks may be done anywhere along the spine from the neck to the low back depending upon the location of your pain. °  After numbing the skin with local anesthetic (like Novocaine), a small needle is passed onto the facet joints under x-ray guidance.  You may experience a sensation of pressure while this is being done.  The entire block usually lasts about 15-25 minutes.  ° °Conditions which may be treated by facet blocks: ° °Low back/buttock pain °Neck/shoulder pain °Certain types of headaches ° °Preparation for the injection: ° °Do not eat any solid food or dairy products within 8 hours of your appointment. °You may drink clear liquid up to 3 hours before appointment.  Clear liquids include water, black coffee, juice or soda.  No milk or cream please. °You may take your regular medication, including pain medications, with a sip of water before your appointment.  Diabetics should hold regular insulin (if taken separately) and take 1/2 normal NPH dose the morning of the procedure.  Carry some sugar containing items with you to your appointment. °A driver must accompany you and be prepared to drive you home after your procedure. °Bring all your current medications with you. °An IV may be inserted and sedation may be given at the discretion of the physician. °A blood pressure cuff, EKG and other monitors will often be applied during the procedure.  Some patients may need to have extra oxygen administered for a short period. °You will be asked to  provide medical information, including your allergies and medications, prior to the procedure.  We must know immediately if you are taking blood thinners (like Coumadin/Warfarin) or if you are allergic to IV iodine contrast (dye).  We must know if you could possible be pregnant. ° °Possible side-effects: ° °Bleeding from needle site °Infection (rare, may require surgery) °Nerve injury (rare) °Numbness & tingling (temporary) °Difficulty urinating (rare, temporary) °Spinal headache (a headache worse with upright posture) °Light-headedness (temporary) °Pain at injection site (serveral days) °Decreased blood pressure (rare, temporary) °Weakness in arm/leg (temporary) °Pressure sensation in back/neck (temporary) ° ° °Call if you experience: ° °Fever/chills associated with headache or increased back/neck pain °Headache worsened by an upright position °New onset, weakness or numbness of an extremity below the injection site °Hives or difficulty breathing (go to the emergency room) °Inflammation or drainage at the injection site(s) °Severe back/neck pain greater than usual °New symptoms which are concerning to you ° °Please note: ° °Although the local anesthetic injected can often make your back or neck feel good for several hours after the injection, the pain will likely return. It takes 3-7 days for steroids to work.  You may not notice any pain relief for at least one week. ° °If effective, we will often do a series of 2-3 injections spaced 3-6 weeks apart to maximally decrease your pain.  After the initial series, you may be a candidate for a more permanent nerve block of the facets. ° °If you have any questions, please call [phone] °[facility]   Pain ClinicPain Management Discharge Instructions ° °General Discharge Instructions : ° °If you need to reach your doctor call: Monday-Friday 8:00 am - 4:00 pm at [phone] or toll free [phone].  After clinic hours [phone] to have  operator reach doctor. ° °Bring all of your medication bottles to all your appointments in the pain clinic. ° °To cancel or reschedule your appointment with Pain Management please remember to call 24 hours in advance to avoid a fee. ° °Refer to the educational materials which you have been given on: General Risks, I had my Procedure. Discharge Instructions, Post Sedation. ° °Post Procedure Instructions: ° °The drugs you were given will stay in your system until tomorrow, so for the next 24 hours you should not drive, make any legal decisions or drink any alcoholic beverages. ° °You may eat anything you prefer, but it is better to start with liquids then soups and crackers, and gradually work up to solid foods. ° °Please notify your doctor immediately if you have any unusual bleeding, trouble breathing or pain that is not related to your normal pain. ° °Depending on the type of procedure that was done, some parts of your body may feel week and/or numb.  This usually clears up by tonight or the next day. ° °Walk with the use of an assistive device or accompanied by an adult for the 24 hours. ° °You may use ice on the affected area for the first 24 hours.  Put ice in a Ziploc bag and cover with a towel and place against area 15 minutes on 15 minutes off.  You may switch to heat after 24 hours.Facet Blocks °Patient Information ° °Description: The facets are joints in the spine between the vertebrae.  Like any joints in the body, facets can become irritated and painful.  Arthritis can also effect the facets.  By injecting steroids and local anesthetic in and around these joints, we can temporarily block the nerve supply to them.  Steroids act directly on irritated nerves and tissues to reduce selling and inflammation which often leads to decreased pain.  Facet blocks may be done anywhere along the spine from the neck to the low back depending upon the location of your pain. °  After numbing the skin with local anesthetic  (like Novocaine), a small needle is passed onto the facet joints under x-ray guidance.  You may experience a sensation of pressure while this is being done.  The entire block usually lasts about 15-25 minutes.  ° °Conditions which may be treated by facet blocks: ° °Low back/buttock pain °Neck/shoulder pain °Certain types of headaches ° °Preparation for the injection: ° °Do not eat any solid food or dairy products within 8 hours of your appointment. °You may drink clear liquid up to 3 hours before appointment.  Clear liquids include water, black coffee, juice or soda.  No milk or cream please. °You may take your regular medication, including pain medications, with a sip of water before your appointment.  Diabetics should hold regular insulin (if taken separately) and take 1/2 normal NPH dose the morning of the procedure.  Carry some sugar containing items with you to your appointment. °A driver must accompany you and be prepared to drive you home after your procedure. °Bring all your current medications with you. °An IV may be inserted and sedation may be given at the discretion of the physician. °A blood pressure cuff, EKG and other monitors will often be applied during the procedure.  Some patients may   need to have extra oxygen administered for a short period. °You will be asked to provide medical information, including your allergies and medications, prior to the procedure.  We must know immediately if you are taking blood thinners (like Coumadin/Warfarin) or if you are allergic to IV iodine contrast (dye).  We must know if you could possible be pregnant. ° °Possible side-effects: ° °Bleeding from needle site °Infection (rare, may require surgery) °Nerve injury (rare) °Numbness & tingling (temporary) °Difficulty urinating (rare, temporary) °Spinal headache (a headache worse with upright posture) °Light-headedness (temporary) °Pain at injection site (serveral days) °Decreased blood pressure (rare,  temporary) °Weakness in arm/leg (temporary) °Pressure sensation in back/neck (temporary) ° ° °Call if you experience: ° °Fever/chills associated with headache or increased back/neck pain °Headache worsened by an upright position °New onset, weakness or numbness of an extremity below the injection site °Hives or difficulty breathing (go to the emergency room) °Inflammation or drainage at the injection site(s) °Severe back/neck pain greater than usual °New symptoms which are concerning to you ° °Please note: ° °Although the local anesthetic injected can often make your back or neck feel good for several hours after the injection, the pain will likely return. It takes 3-7 days for steroids to work.  You may not notice any pain relief for at least one week. ° °If effective, we will often do a series of 2-3 injections spaced 3-6 weeks apart to maximally decrease your pain.  After the initial series, you may be a candidate for a more permanent nerve block of the facets. ° °If you have any questions, please call [phone] °[facility] °

## 2024-05-31 NOTE — Progress Notes (Addendum)
 PROVIDER NOTE: Interpretation of information contained herein should be left to medically-trained personnel. Specific patient instructions are provided elsewhere under Patient Instructions section of medical record. This document was created in part using STT-dictation technology, any transcriptional errors that may result from this process are unintentional.  Patient: Peter Webster Type: Established DOB: 1940/12/29 MRN: [id number] PCP: [facility]  Service: Procedure DOS: 05/31/2024 Setting: Ambulatory Location: Ambulatory outpatient facility Delivery: Face-to-face Provider: Eric DELENA Como, MD Specialty: Interventional Pain Management Specialty designation: 09 Location: Outpatient facility Ref. Prov.: [facility]       Interventional Therapy   Type: Lumbar Facet, Medial Branch Block(s)   #4  Laterality: Bilateral  Level: L2, L3, L4, L5, and S1 Medial Branch/Dorsal Rami Level(s). Injecting these levels blocks the L3-4, L4-5, and L5-S1 lumbar facet joints. Imaging: Fluoroscopic guidance Spinal ([id number]) Anesthesia: Local anesthesia (1-2% Lidocaine ) Anxiolysis: None                            Sedation: No Sedation                       DOS: 05/31/2024 Performed by: Eric DELENA Como, MD  Primary Purpose: Diagnostic/Therapeutic Indications: Low back pain severe enough to impact quality of life or function. 1. 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 Lumbar facet joint pain   3. Lumbar facet joint syndrome   4. Osteoarthritis of facet joint of lumbar spine   5. Lumbar facet arthropathy   6. Spondylosis without myelopathy or radiculopathy, lumbar region   7. Chronic bilateral low back pain without sciatica    NAS-11 Pain score:   Pre-procedure: 8 /10   Post-procedure: 8 /10     Position / Prep / Materials:  Position: Prone  Prep solution: ChloraPrep (2% chlorhexidine  gluconate and 70% isopropyl alcohol) Area Prepped: Posterolateral  Lumbosacral Spine (Wide prep: From the lower border of the scapula down to the end of the tailbone and from flank to flank.)  Materials:  Tray: Block Needle(s):  Type: Spinal  Gauge (G): 22  Length: 3.5-in Qty: 4     H&P (Pre-op Assessment):  Peter Webster is a 83 y.o. (year old), male patient, seen today for interventional treatment. He  has a past surgical history that includes Colonoscopy (2019); bilateral hernia (2018); XI robotic assisted ventral hernia (N/A, 12/18/2021); and Insertion of mesh (12/18/2021). Peter Webster has a current medication list which includes the following prescription(s): acetaminophen , acyclovir, amlodipine, ammonium lactate, calcium carb-cholecalciferol, carbidopa-levodopa, cetirizine, clobetasol ointment, dss, famotidine, fluoxetine, gabapentin , hydrocortisone, lidocaine , losartan, pantoprazole, artificial tears, rosuvastatin, simvastatin, journavx , tizanidine, tramadol , triamcinolone  ointment, and urea. His primarily concern today is the Back Pain (lower)  Initial Vital Signs:  Pulse/HCG Rate: 91ECG Heart Rate: 95 Temp: (!) 97 F (36.1 C) Resp: 18 BP: 117/74 SpO2: 100 %  BMI: Estimated body mass index is 30.27 kg/m as calculated from the following:   Height as of this encounter: 5' 11 (1.803 m).   Weight as of this encounter: 217 lb (98.4 kg).  Risk Assessment: Allergies: Reviewed. He is allergic to lisinopril.  Allergy Precautions: None required Coagulopathies: Reviewed. None identified.  Blood-thinner therapy: None at this time Active Infection(s): Reviewed. None identified. Peter Webster is afebrile  Site Confirmation: Peter Webster was asked to confirm the procedure and laterality before marking the site Procedure checklist: Completed Consent: Before the procedure and under the influence of no sedative(s), amnesic(s), or anxiolytics, the patient was  informed of the treatment options, risks and possible complications. To fulfill our ethical and legal  obligations, as recommended by the American Medical Association's Code of Ethics, I have informed the patient of my clinical impression; the nature and purpose of the treatment or procedure; the risks, benefits, and possible complications of the intervention; the alternatives, including doing nothing; the risk(s) and benefit(s) of the alternative treatment(s) or procedure(s); and the risk(s) and benefit(s) of doing nothing. The patient was provided information about the general risks and possible complications associated with the procedure. These may include, but are not limited to: failure to achieve desired goals, infection, bleeding, organ or nerve damage, allergic reactions, paralysis, and death. In addition, the patient was informed of those risks and complications associated to Spine-related procedures, such as failure to decrease pain; infection (i.e.: Meningitis, epidural or intraspinal abscess); bleeding (i.e.: epidural hematoma, subarachnoid hemorrhage, or any other type of intraspinal or peri-dural bleeding); organ or nerve damage (i.e.: Any type of peripheral nerve, nerve root, or spinal cord injury) with subsequent damage to sensory, motor, and/or autonomic systems, resulting in permanent pain, numbness, and/or weakness of one or several areas of the body; allergic reactions; (i.e.: anaphylactic reaction); and/or death. Furthermore, the patient was informed of those risks and complications associated with the medications. These include, but are not limited to: allergic reactions (i.e.: anaphylactic or anaphylactoid reaction(s)); adrenal axis suppression; blood sugar elevation that in diabetics may result in ketoacidosis or comma; water retention that in patients with history of congestive heart failure may result in shortness of breath, pulmonary edema, and decompensation with resultant heart failure; weight gain; swelling or edema; medication-induced neural toxicity; particulate matter embolism and  blood vessel occlusion with resultant organ, and/or nervous system infarction; and/or aseptic necrosis of one or more joints. Finally, the patient was informed that Medicine is not an exact science; therefore, there is also the possibility of unforeseen or unpredictable risks and/or possible complications that may result in a catastrophic outcome. The patient indicated having understood very clearly. We have given the patient no guarantees and we have made no promises. Enough time was given to the patient to ask questions, all of which were answered to the patient's satisfaction. Peter Webster has indicated that he wanted to continue with the procedure. Attestation: I, the ordering provider, attest that I have discussed with the patient the benefits, risks, side-effects, alternatives, likelihood of achieving goals, and potential problems during recovery for the procedure that I have provided informed consent. Date  Time: 05/31/2024  1:37 PM  Pre-Procedure Preparation:  Monitoring: As per clinic protocol. Respiration, ETCO2, SpO2, BP, heart rate and rhythm monitor placed and checked for adequate function Safety Precautions: Patient was assessed for positional comfort and pressure points before starting the procedure. Time-out: I initiated and conducted the Time-out before starting the procedure, as per protocol. The patient was asked to participate by confirming the accuracy of the Time Out information. Verification of the correct person, site, and procedure were performed and confirmed by me, the nursing staff, and the patient. Time-out conducted as per Joint Commission's Universal Protocol (UP.01.01.01). Time: 1414 Start Time: 1414 hrs.  Description of Procedure:          Laterality: (see above) Targeted Levels: (see above)  Safety Precautions: Aspiration looking for blood return was conducted prior to all injections. At no point did we inject any substances, as a needle was being advanced.  Before injecting, the patient was told to immediately notify me if he was experiencing any  new onset of ringing in the ears, or metallic taste in the mouth. No attempts were made at seeking any paresthesias. Safe injection practices and needle disposal techniques used. Medications properly checked for expiration dates. SDV (single dose vial) medications used. After the completion of the procedure, all disposable equipment used was discarded in the proper designated medical waste containers. Local Anesthesia: Protocol guidelines were followed. The patient was positioned over the fluoroscopy table. The area was prepped in the usual manner. The time-out was completed. The target area was identified using fluoroscopy. A 12-in long, straight, sterile hemostat was used with fluoroscopic guidance to locate the targets for each level blocked. Once located, the skin was marked with an approved surgical skin marker. Once all sites were marked, the skin (epidermis, dermis, and hypodermis), as well as deeper tissues (fat, connective tissue and muscle) were infiltrated with a small amount of a short-acting local anesthetic, loaded on a 10cc syringe with a 25G, 1.5-in  Needle. An appropriate amount of time was allowed for local anesthetics to take effect before proceeding to the next step. Local Anesthetic: Lidocaine  2.0% The unused portion of the local anesthetic was discarded in the proper designated containers. Technical description of process:  Medial Branch  Dorsal Rami Nerve Block (MBB):  Neuroanatomy note: Each lumbar facet joint receives dual innervation from medial branches arising from the posterior primary rami at the same level and one level above. The target for each lumbar medial branch is the junction of the ipsilateral superior articular and transverse process of the lower vertebral body. (i.e.: The L4-L5 facet joint is innervated by the L4 medial branch [located at L5] and the L3 medial branch [located at  L4]. Blocking the L4 Medial Branch is therefore achieved by injecting at the junction of the ipsilateral superior articular and transverse process of the lower vertebral body [L5].).  Exception: The exception to the above rule is the L5-S1 facet joint which has triple innervation requiring the L4 medial branch, as well as the L5 and the S1 Dorsal Rami(s) to be blocked to fully denervate the joint.  Under fluoroscopic guidance, a needle was inserted until contact was made with os over the target area. After negative aspiration, 0.5 mL of the nerve block solution was injected without difficulty or complication. Paresthesia were avoided during injection. The needle(s) were removed intact and without complication.  Once the entire procedure was completed, the treated area was cleaned, making sure to leave some of the prepping solution back to take advantage of its long term bactericidal properties.         Illustration of the posterior view of the lumbar spine and the posterior neural structures. Laminae of L2 through S1 are labeled. DPRL5, dorsal primary ramus of L5; DPRS1, dorsal primary ramus of S1; DPR3, dorsal primary ramus of L3; FJ, facet (zygapophyseal) joint L3-L4; I, inferior articular process of L4; LB1, lateral branch of dorsal primary ramus of L1; IAB, inferior articular branches from L3 medial branch (supplies L4-L5 facet joint); IBP, intermediate branch plexus; MB3, medial branch of dorsal primary ramus of L3; NR3, third lumbar nerve root; S, superior articular process of L5; SAB, superior articular branches from L4 (supplies L4-5 facet joint also); TP3, transverse process of L3.   Facet Joint Innervation (* possible contribution)  L1-2 T12, L1 (L2*)  Medial Branch  L2-3 L1, L2 (L3*)                     L3-4 L2, L3 (L4*)  L4-5 L3, L4 (L5*)                     L5-S1 L4, L5, S1                        Vitals:   05/31/24 1335 05/31/24 1414 05/31/24 1419  05/31/24 1422  BP: 117/74 105/73 103/80 (!) 140/93  Pulse: 91     Resp: 18 18 15 17   Temp: (!) 97 F (36.1 C)     TempSrc: Temporal     SpO2: 100% 100% 100% 100%  Weight: 217 lb (98.4 kg)     Height: 5' 11 (1.803 m)        End Time: 1422 hrs.  Imaging Guidance (Spinal):         Type of Imaging Technique: Fluoroscopy Guidance (Spinal) Indication(s): Fluoroscopy guidance for needle placement to enhance accuracy in procedures requiring precise needle localization for targeted delivery of medication in or near specific anatomical locations not easily accessible without such real-time imaging assistance. Exposure Time: Please see nurses notes. Contrast: None used. Fluoroscopic Guidance: I was personally present during the use of fluoroscopy. Tunnel Vision Technique used to obtain the best possible view of the target area. Parallax error corrected before commencing the procedure. Direction-depth-direction technique used to introduce the needle under continuous pulsed fluoroscopy. Once target was reached, antero-posterior, oblique, and lateral fluoroscopic projection used confirm needle placement in all planes. Images permanently stored in EMR. Interpretation: No contrast injected. I personally interpreted the imaging intraoperatively. Adequate needle placement confirmed in multiple planes. Permanent images saved into the patient's record.  Post-operative Assessment:  Post-procedure Vital Signs:  Pulse/HCG Rate: 91(!) 106 Temp: (!) 97 F (36.1 C) Resp: 17 BP: (!) 140/93 SpO2: 100 %  EBL: None  Complications: No immediate post-treatment complications observed by team, or reported by patient.  Note: The patient tolerated the entire procedure well. A repeat set of vitals were taken after the procedure and the patient was kept under observation following institutional policy, for this type of procedure. Post-procedural neurological assessment was performed, showing return to baseline,  prior to discharge. The patient was provided with post-procedure discharge instructions, including a section on how to identify potential problems. Should any problems arise concerning this procedure, the patient was given instructions to immediately contact us , at any time, without hesitation. In any case, we plan to contact the patient by telephone for a follow-up status report regarding this interventional procedure.  Comments:  No additional relevant information.  Plan of Care (POC)  Orders:  Orders Placed This Encounter  Procedures   LUMBAR FACET(MEDIAL BRANCH NERVE BLOCK) MBNB    Scheduling Instructions:     Procedure: Lumbar facet block (AKA.: Lumbosacral medial branch nerve block)     Side: Bilateral     Level: L3-4, L4-5, and L5-S1 Facets (L2, L3, L4, L5, and S1 Medial Branch Nerves)     Sedation: Patient's choice.     Date: 05/31/2024    Where will this procedure be performed?:   [facility]   [facility] C-ARM 1-60 MIN NO REPORT    Intraoperative interpretation by procedural physician at [facility].    Standing Status:   Standing    Number of Occurrences:   1    Reason for exam::   Assistance in needle guidance and placement for procedures requiring needle placement in or near specific anatomical locations not easily accessible without such assistance.  Informed Consent Details: Physician/Practitioner Attestation; Transcribe to consent form and obtain patient signature    Nursing Order: Transcribe to consent form and obtain patient signature. Note: Always confirm laterality of pain with Peter Webster, before procedure.    Physician/Practitioner attestation of informed consent for procedure/surgical case:   I, the physician/practitioner, attest that I have discussed with the patient the benefits, risks, side effects, alternatives, likelihood of achieving goals and potential problems during recovery for the procedure that I have provided informed consent.     Procedure:   Lumbar Facet Block  under fluoroscopic guidance    Physician/Practitioner performing the procedure:   Deola Rewis A. Tanya MD    Indication/Reason:   Low Back Pain, with our without leg pain, due to Facet Joint Arthralgia (Joint Pain) Spondylosis (Arthritis of the Spine), without myelopathy or radiculopathy (Nerve Damage).   Provide equipment / supplies at bedside    Procedure tray: Block Tray (Disposable  single use) Skin infiltration needle: Regular 1.5-in, 25-G, (x1) Block Needle type: Spinal Amount/quantity: 4 Size: Regular (3.5-inch) Gauge: 22G    Standing Status:   Standing    Number of Occurrences:   1    Specify:   Block Tray    No chronic opioid analgesics therapy prescribed by our practice.   Medications ordered for procedure: Meds ordered this encounter  Medications   lidocaine  (XYLOCAINE ) 2 % (with pres) injection 400 mg   pentafluoroprop-tetrafluoroeth (GEBAUERS) aerosol   ropivacaine  (PF) 2 mg/mL (0.2%) (NAROPIN ) injection 18 mL   triamcinolone  acetonide (KENALOG -40) injection 80 mg   Medications administered: We administered lidocaine , pentafluoroprop-tetrafluoroeth, ropivacaine  (PF) 2 mg/mL (0.2%), and triamcinolone  acetonide.  See the medical record for exact dosing, route, and time of administration.    Interventional Therapies  Risk Factors  Considerations:  Open Angle Glaucoma  CAD  GERD  OSA  Thrombocytopenia  Pancytopenia  HTN  Hx. Acute Kidney Failure  Bladder Cancer     Planned  Pending:      Under consideration:   Diagnostic bilateral knee joint x-rays   Completed:   Diagnostic bilateral lumbar facet (L3-L5) MBB x1 (04/30/2022) (100/100/100 x 3 days / 50-60% x 8 weeks)  Diagnostic/therapeutic bilateral IA steroid knee injection #1 (10/28/2022) by Dr. Tanya (9-0/10)    Therapeutic  Palliative (PRN) options:   None established   Completed by other providers:   Dx. EMG/PNCV (03/11/2022) by Jannett Fairly [facility]) Dx:  Generalized severe sensorimotor polyneuropathy       Follow-up plan:   Return in about 2 weeks (around 06/14/2024) for (Face2F), (PPE).     Recent Visits Date Type Provider Dept  05/31/24 Procedure visit Tanya Glisson, MD [facility]  03/17/24 Office Visit Marcelino Nurse, MD [facility]  Showing recent visits within past 90 days and meeting all other requirements Future Appointments Date Type Provider Dept  06/15/24 Appointment Tanya Glisson, MD [facility]  Showing future appointments within next 90 days and meeting all other requirements   Disposition: Discharge home  Discharge (Date  Time): 05/31/2024; 1435 hrs.   Primary Care Physician: [facility] Location: [facility] Note by: Glisson DELENA Tanya, MD (TTS technology used. I apologize for any typographical errors that were not detected and corrected.) Date: 05/31/2024; Time: 7:52 AM  Disclaimer:  Medicine is not an visual merchandiser. The only guarantee in medicine is that nothing is guaranteed. It is important to note that the decision to proceed with this intervention was based on the information collected from  the patient. The Data and conclusions were drawn from the patient's questionnaire, the interview, and the physical examination. Because the information was provided in large part by the patient, it cannot be guaranteed that it has not been purposely or unconsciously manipulated. Every effort has been made to obtain as much relevant data as possible for this evaluation. It is important to note that the conclusions that lead to this procedure are derived in large part from the available data. Always take into account that the treatment will also be dependent on availability of resources and existing treatment guidelines, considered by other Pain Management Practitioners as being common knowledge and practice, at the time of the intervention. For  Medico-Legal purposes, it is also important to point out that variation in procedural techniques and pharmacological choices are the acceptable norm. The indications, contraindications, technique, and results of the above procedure should only be interpreted and judged by a Board-Certified Interventional Pain Specialist with extensive familiarity and expertise in the same exact procedure and technique.

## 2024-06-01 ENCOUNTER — Telehealth: Payer: Self-pay | Admitting: *Deleted

## 2024-06-01 NOTE — Telephone Encounter (Signed)
 Post procedure call; voicemail left

## 2024-06-14 NOTE — Progress Notes (Unsigned)
 PROVIDER NOTE: Interpretation of information contained herein should be left to medically-trained personnel. Specific patient instructions are provided elsewhere under Patient Instructions section of medical record. This document was created in part using AI and STT-dictation technology, any transcriptional errors that may result from this process are unintentional.  Patient: Peter Webster  Service: E/M   PCP: [facility]  DOB: Oct 09, 1940  DOS: 06/15/2024  Provider: Eric DELENA Como, MD  MRN: [id number]  Delivery: Face-to-face  Specialty: Interventional Pain Management  Type: Established Patient  Setting: Ambulatory outpatient facility  Specialty designation: 09  Referring Prov.: [facility]  Location: Outpatient office facility       History of present illness (HPI) [name], a 83 y.o. year old male, is here today because of his Chronic bilateral low back pain without sciatica [M54.50, G89.29]. Mr. [name] primary complain today is No chief complaint on file.  Pertinent problems: [name] has Multiple myeloma not having achieved remission (HCC); Wedge compression fracture of t11-T12 vertebra, sequela; Bladder cancer (HCC); Chronic bilateral low back pain with left-sided sciatica; Lumbar spondylosis; Lumbar facet arthropathy; Chronic radicular lumbar pain; Chronic pain syndrome; Compression fracture of thoracic vertebra (HCC); Thoracic spondylosis; Bilateral chronic knee pain; Bilateral primary osteoarthritis of knee; Malignant neoplasm of bladder (HCC); Pain in left hip; Carcinoma in situ of bladder; Low back pain, unspecified; Low back pain; Wedge compression fracture of unspecified thoracic vertebra, sequela; 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  Pain Assessment: Severity of   is reported as a  /10. Location:    / . Onset:  . Quality:  . Timing:  . Modifying factor(s):  SABRA Vitals:  vitals were not taken for this visit.  BMI: Estimated body mass index is 30.27 kg/m as calculated from the following:   Height as of 05/31/24: 5' 11 (1.803 m).   Weight as of 05/31/24: 217 lb (98.4 kg).  Last encounter: Visit date not found. Last procedure: 05/31/2024.  Reason for encounter: post-procedure evaluation and assessment.   Discussed the use of AI scribe software for clinical note transcription with the patient, who gave verbal consent to proceed.  History of Present Illness          Post-Procedure Evaluation   Type: Lumbar Facet, Medial Branch Block(s)   #2  Laterality: Bilateral  Level: L2, L3, L4, L5, and S1 Medial Branch/Dorsal Rami Level(s). Injecting these levels blocks the L3-4, L4-5, and L5-S1 lumbar facet joints. Imaging: Fluoroscopic guidance Spinal ([id number]) Anesthesia: Local anesthesia (1-2% Lidocaine ) Anxiolysis: None                            Sedation: No Sedation                       DOS: 05/31/2024 Performed by: Eric DELENA Como, MD  Primary Purpose: Diagnostic/Therapeutic Indications: Low back pain severe enough to impact quality of life or function. 1. 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 Lumbar facet joint pain  3. Lumbar facet joint syndrome   4. Osteoarthritis of facet joint of lumbar spine   5. Lumbar facet arthropathy   6. Spondylosis without myelopathy or radiculopathy, lumbar region   7. Chronic  bilateral low back pain without sciatica    NAS-11 Pain score:   Pre-procedure: 8 /10   Post-procedure: 8 /10     Effectiveness:  Initial hour after procedure:   ***. Subsequent 4-6 hours post-procedure:   ***. Analgesia past initial 6 hours:   ***. Ongoing improvement:  Analgesic:  *** Function:    ***    ROM:    ***    Interpretation: ***  Pharmacotherapy Assessment   No chronic opioid analgesics therapy prescribed by our practice.  Monitoring: [place] PMP: PDMP reviewed during this encounter.       Pharmacotherapy: No side-effects or adverse reactions reported. Compliance: No problems identified. Effectiveness: Clinically acceptable.  No notes on file  UDS:  Summary  Date Value Ref Range Status  11/20/2021 Note  Final    Comment:    ==================================================================== Compliance Drug Analysis, Ur ==================================================================== Test                             Result       Flag       Units  Drug Present and Declared for Prescription Verification   Acetaminophen                   PRESENT      EXPECTED  Drug Absent but Declared for Prescription Verification   Gabapentin                      Not Detected UNEXPECTED   Tizanidine                     Not Detected UNEXPECTED    Tizanidine, as indicated in the declared medication list, is not    always detected even when used as directed.    Prochlorperazine               Not Detected UNEXPECTED   Lidocaine                       Not Detected UNEXPECTED    Lidocaine , as indicated in the declared medication list, is not    always detected even when used as directed.  ==================================================================== Test                      Result    Flag   Units      Ref Range   Creatinine              168              mg/dL      >=79 ==================================================================== Declared Medications:  The  flagging and interpretation on this report are based on the  following declared medications.  Unexpected results may arise from  inaccuracies in the declared medications.   **Note: The testing scope of this panel includes these medications:   Gabapentin  (Neurontin )  Prochlorperazine (Compazine)   **Note: The testing scope of this panel does not include small to  moderate amounts of these reported medications:   Acetaminophen  (Tylenol )  Lidocaine  (Xylocaine )  Tizanidine   **Note: The testing scope of this panel does not include the  following reported medications:   Acyclovir  Amlodipine (Norvasc)  Calcium  Capsaicin  Cetirizine (Zyrtec)  Cholecalciferol  Clobetasol  Cyanocobalamin  Docusate  Hydrocortisone  Omeprazole  Sildenafil (Viagra)  Simvastatin (Zocor)  Topical  Triamcinolone  (Kenalog ) ==================================================================== For clinical consultation, please call [phone]. ====================================================================     No results found for: CBDTHCR No results found for: D8THCCBX No results found for: D9THCCBX  ROS  Constitutional: Denies any fever or chills Gastrointestinal: No reported hemesis, hematochezia, vomiting, or acute GI distress Musculoskeletal: Denies any acute onset joint swelling, redness, loss of ROM, or weakness Neurological: No reported episodes of acute onset apraxia, aphasia, dysarthria, agnosia, amnesia, paralysis, loss of coordination, or loss of consciousness  Medication Review  Calcium Carb-Cholecalciferol, DSS, FLUoxetine, Suzetrigine , acetaminophen , acyclovir, amLODipine, ammonium lactate, artificial tears, carbidopa-levodopa, cetirizine, clobetasol ointment, famotidine, gabapentin , hydrocortisone, lidocaine , losartan, pantoprazole, rosuvastatin, simvastatin, tiZANidine, traMADol , triamcinolone  ointment, and urea  History Review  Allergy: Mr. Law is allergic to  lisinopril. Drug: Mr. Lafoe  reports no history of drug use. Alcohol:  reports current alcohol use of about 1.0 standard drink of alcohol per week. Tobacco:  reports that he has never smoked. He has never used smokeless tobacco. Social: Mr. Escher  reports that he has never smoked. He has never used smokeless tobacco. He reports current alcohol use of about 1.0 standard drink of alcohol per week. He reports that he does not use drugs. Medical:  has a past medical history of Acute kidney failure, Allergy, Arthritis, Bladder cancer (HCC), Chronic pain syndrome, DDD (degenerative disc disease), lumbar, ED (erectile dysfunction), Elevated PSA, GERD (gastroesophageal reflux disease), Headache, Hernia of anterior abdominal wall, Hyperlipidemia, Hypertension, Multiple myeloma not having achieved remission (HCC), Right inguinal hernia, Sleep apnea, Thrombocytopenia, and Wedge compression fracture of t11-T12 vertebra, sequela. Surgical: Mr. Bartmess  has a past surgical history that includes Colonoscopy (2019); bilateral hernia (2018); XI robotic assisted ventral hernia (N/A, 12/18/2021); and Insertion of mesh (12/18/2021). Family: family history includes Cervical cancer in his mother; Stroke in his mother.  Laboratory Chemistry Profile   Renal Lab Results  Component Value Date   BUN 22 11/20/2021   CREATININE 1.96 (H) 11/20/2021   BCR 11 11/20/2021    Hepatic Lab Results  Component Value Date   AST 18 11/20/2021   ALBUMIN 4.6 11/20/2021   ALKPHOS 82 11/20/2021    Electrolytes Lab Results  Component Value Date   NA 142 11/20/2021   K 3.7 11/20/2021   CL 104 11/20/2021   CALCIUM 10.1 11/20/2021   MG 2.0 11/20/2021    Bone Lab Results  Component Value Date   25OHVITD1 38 11/20/2021   25OHVITD2 1.0 11/20/2021   25OHVITD3 37 11/20/2021    Inflammation (CRP: Acute Phase) (ESR: Chronic Phase) No results found for: CRP, ESRSEDRATE, LATICACIDVEN       Note: Above Lab results  reviewed.  Recent Imaging Review  DG PAIN CLINIC C-ARM 1-60 MIN NO REPORT Fluoro was used, but no Radiologist interpretation will be provided.  Please refer to NOTES tab for provider progress note. Note: Reviewed        Physical Exam  Vitals: There were no vitals taken for this visit. BMI: Estimated body mass index is 30.27 kg/m as calculated from the following:   Height as of 05/31/24: 5' 11 (1.803 m).   Weight as of 05/31/24: 217 lb (98.4 kg). Ideal: Patient weight not recorded General appearance: Well nourished, well developed, and well hydrated. In no apparent acute distress Mental status: Alert, oriented x 3 (person, place, & time)  Respiratory: No evidence of acute respiratory distress Eyes: PERLA   Assessment   Diagnosis Status  1. 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 Lumbar facet joint pain   3. Lumbar facet joint syndrome   4. Bilateral chronic knee pain   5. Postop check    Controlled Controlled Controlled   Updated Problems: No problems updated.  Plan of Care  Problem-specific:  Assessment and Plan            Mr. Jahmad Petrich has a current medication list which includes the following long-term medication(s): amlodipine, calcium carb-cholecalciferol, carbidopa-levodopa, cetirizine, famotidine, fluoxetine, gabapentin , losartan, pantoprazole, rosuvastatin, and simvastatin.  Pharmacotherapy (Medications Ordered): No orders of the defined types were placed in this encounter.  Orders:  No orders of the defined types were placed in this encounter.    Interventional Therapies  Risk Factors  Considerations:  Open Angle Glaucoma  CAD  GERD  OSA  Thrombocytopenia  Pancytopenia  HTN  Hx. Acute Kidney Failure  Bladder Cancer     Planned  Pending:      Under consideration:   Diagnostic bilateral knee joint x-rays   Completed:   Diagnostic bilateral lumbar facet (L3-L5) MBB x1 (04/30/2022) (100/100/100 x 3 days / 50-60% x  8 weeks)  Diagnostic/therapeutic bilateral IA steroid knee injection #1 (10/28/2022) by Dr. Tanya (9-0/10)    Therapeutic  Palliative (PRN) options:   None established   Completed by other providers:   Dx. EMG/PNCV (03/11/2022) by Jannett Fairly [facility]) Dx: Generalized severe sensorimotor polyneuropathy      No follow-ups on file.    Recent Visits Date Type Provider Dept  05/31/24 Procedure visit Tanya Glisson, MD [facility]  03/17/24 Office Visit Marcelino Nurse, MD [facility]  Showing recent visits within past 90 days and meeting all other requirements Future Appointments Date Type Provider Dept  06/15/24 Appointment Tanya Glisson, MD [facility]  Showing future appointments within next 90 days and meeting all other requirements  I discussed the assessment and treatment plan with the patient. The patient was provided an opportunity to ask questions and all were answered. The patient agreed with the plan and demonstrated an understanding of the instructions.  Patient advised to call back or seek an in-person evaluation if the symptoms or condition worsens.  Duration of encounter: *** minutes.  Total time on encounter, as per AMA guidelines included both the face-to-face and non-face-to-face time personally spent by the physician and/or other qualified health care professional(s) on the day of the encounter (includes time in activities that require the physician or other qualified health care professional and does not include time in activities normally performed by clinical staff). Physician's time may include the following activities when performed: Preparing to see the patient (e.g., pre-charting review of records, searching for previously ordered imaging, lab work, and nerve conduction tests) Review of prior analgesic pharmacotherapies. Reviewing PMP Interpreting ordered tests (e.g., lab work, imaging, nerve conduction  tests) Performing post-procedure evaluations, including interpretation of diagnostic procedures Obtaining and/or reviewing separately obtained history Performing a medically appropriate examination and/or evaluation Counseling and educating the patient/family/caregiver Ordering medications, tests, or procedures Referring and communicating with other health care professionals (when not separately reported) Documenting clinical information in the electronic or other health record Independently interpreting results (not separately reported) and communicating results to the patient/ family/caregiver Care coordination (not separately reported)  Note by: Glisson DELENA Tanya, MD (TTS and AI technology used. I apologize for any typographical errors  that were not detected and corrected.) Date: 06/15/2024; Time: 6:11 PM

## 2024-06-15 ENCOUNTER — Encounter: Payer: Self-pay | Admitting: Pain Medicine

## 2024-06-15 ENCOUNTER — Ambulatory Visit: Attending: Pain Medicine | Admitting: Pain Medicine

## 2024-06-15 VITALS — BP 128/74 | HR 45 | Temp 97.9°F | Resp 16 | Ht 71.0 in | Wt 217.0 lb

## 2024-06-15 DIAGNOSIS — M5459 Other low back pain: Secondary | ICD-10-CM | POA: Insufficient documentation

## 2024-06-15 DIAGNOSIS — G8929 Other chronic pain: Secondary | ICD-10-CM

## 2024-06-15 DIAGNOSIS — M545 Low back pain, unspecified: Secondary | ICD-10-CM | POA: Insufficient documentation

## 2024-06-15 DIAGNOSIS — Z09 Encounter for follow-up examination after completed treatment for conditions other than malignant neoplasm: Secondary | ICD-10-CM

## 2024-06-15 DIAGNOSIS — M47816 Spondylosis without myelopathy or radiculopathy, lumbar region: Secondary | ICD-10-CM | POA: Insufficient documentation

## 2024-06-15 DIAGNOSIS — M25561 Pain in right knee: Secondary | ICD-10-CM | POA: Insufficient documentation

## 2024-06-15 DIAGNOSIS — M25562 Pain in left knee: Secondary | ICD-10-CM | POA: Insufficient documentation

## 2024-06-15 NOTE — Patient Instructions (Addendum)
 ______________________________________________________________________    Preparing for your procedure  Appointments: If you think you may not be able to keep your appointment, call 24-48 hours in advance to cancel. We need time to make it available to others.  Procedure visits are for procedures only. During your procedure appointment there will be: NO Prescription Refills*. NO medication changes or discussions*. NO discussion of disability issues*. NO unrelated pain problem evaluations*. NO evaluations to order other pain procedures*. *These will be addressed at a separate and distinct evaluation encounter on the provider's evaluation schedule and not during procedure days.  Instructions: Food intake: Avoid eating anything solid for at least 8 hours prior to your procedure. Clear liquid intake: You may take clear liquids such as water up to 2 hours prior to your procedure. (No carbonated drinks. No soda.) Transportation: Unless otherwise stated by your physician, bring a driver. (Driver cannot be a Market Researcher, Pharmacist, Community, or any other form of public transportation.) Morning Medicines: Except for blood thinners, take all of your other morning medications with a sip of water. Make sure to take your heart and blood pressure medicines. If your blood pressure's lower number is above 100, the case will be rescheduled. Blood thinners: Make sure to stop your blood thinners as instructed.  If you take a blood thinner, but were not instructed to stop it, call our office [phone] and ask to talk to a nurse. Not stopping a blood thinner prior to certain procedures could lead to serious complications. Diabetics on insulin: Notify the staff so that you can be scheduled 1st case in the morning. If your diabetes requires high dose insulin, take only  of your normal insulin dose the morning of the procedure and notify the staff that you have done so. Preventing infections: Shower with an antibacterial soap the  morning of your procedure.  Build-up your immune system: Take 1000 mg of Vitamin C with every meal (3 times a day) the day prior to your procedure. Antibiotics: Inform the nursing staff if you are taking any antibiotics or if you have any conditions that may require antibiotics prior to procedures. (Example: recent joint implants)   Pregnancy: If you are pregnant make sure to notify the nursing staff. Not doing so may result in injury to the fetus, including death.  Sickness: If you have a cold, fever, or any active infections, call and cancel or reschedule your procedure. Receiving steroids while having an infection may result in complications. Arrival: You must be in the facility at least 30 minutes prior to your scheduled procedure. Tardiness: Your scheduled time is also the cutoff time. If you do not arrive at least 15 minutes prior to your procedure, you will be rescheduled.  Children: Do not bring any children with you. Make arrangements to keep them home. Dress appropriately: There is always a possibility that your clothing may get soiled. Avoid long dresses. Valuables: Do not bring any jewelry or valuables.  Reasons to call and reschedule or cancel your procedure: (Following these recommendations will minimize the risk of a serious complication.) Surgeries: Avoid having procedures within 2 weeks of any surgery. (Avoid for 2 weeks before or after any surgery). Flu Shots: Avoid having procedures within 2 weeks of a flu shots or . (Avoid for 2 weeks before or after immunizations). Barium: Avoid having a procedure within 7-10 days after having had a radiological study involving the use of radiological contrast. (Myelograms, Barium swallow or enema study). Heart attacks: Avoid any elective procedures or surgeries for the  initial 6 months after a Myocardial Infarction (Heart Attack). Blood thinners: It is imperative that you stop these medications before procedures. Let us  know if you if you take  any blood thinner.  Infection: Avoid procedures during or within two weeks of an infection (including chest colds or gastrointestinal problems). Symptoms associated with infections include: Localized redness, fever, chills, night sweats or profuse sweating, burning sensation when voiding, cough, congestion, stuffiness, runny nose, sore throat, diarrhea, nausea, vomiting, cold or Flu symptoms, recent or current infections. It is specially important if the infection is over the area that we intend to treat. Heart and lung problems: Symptoms that may suggest an active cardiopulmonary problem include: cough, chest pain, breathing difficulties or shortness of breath, dizziness, ankle swelling, uncontrolled high or unusually low blood pressure, and/or palpitations. If you are experiencing any of these symptoms, cancel your procedure and contact your primary care physician for an evaluation.  Remember:  Regular Business hours are:  Monday to Thursday 8:00 AM to 4:00 PM  Provider's Schedule: Eric Como, MD:  Procedure days: Tuesday and Thursday 7:30 AM to 4:00 PM  Wallie Sherry, MD:  Procedure days: Monday and Wednesday 7:30 AM to 4:00 PM Last  Updated: 06/16/2023 ______________________________________________________________________    ______________________________________________________________________    Steroid injections  Common steroids for injections Triamcinolone : Used by many sports medicine physicians for large joint and bursal injections, often combined with a local anesthetic like lidocaine . A study focusing on coccydynia (tailbone pain) found triamcinolone  was more effective than betamethasone , suggesting it may also be preferable for other localized inflammation conditions. Methylprednisolone : A common alternative to triamcinolone  that is also a strong anti-inflammatory. It is available in different formulations, with the acetate suspension being the long-acting option for  intra-articular injections. Dexamethasone : This is a non-particulate steroid, meaning it has a lower risk of tissue damage compared to particulate steroids like triamcinolone  and methylprednisolone . While less common for this specific use, it is an option for targeted injections.   Considerations for physicians Particulate vs. non-particulate steroids: Triamcinolone  and methylprednisolone  are particulate, meaning they can clump together. Dexamethasone  is non-particulate. Particulate steroids are often preferred for their longer-lasting effects but carry a theoretical higher risk for certain injections (though this is less of a concern in the costochondral joints). Combined injectate: Corticosteroids are typically mixed with a local anesthetic like lidocaine  to provide both immediate pain relief (from the anesthetic) and longer-term inflammation reduction (from the steroid). Imaging guidance: To ensure accurate placement of the needle and medication, physicians may use ultrasound or fluoroscopic guidance for the injection, especially in complex or refractory cases.   Patient guidance Before undergoing a steroid injection, discuss the options with your physician. They will determine the best steroid, dosage, and procedure for your specific case based on factors like: Severity of your condition History of response to other treatments Your overall health status Experience and preference of the physician  Last  Updated: 03/01/2024 ______________________________________________________________________     ______________________________________________________________________    Procedure instructions  Stop blood-thinners  Do not eat or drink fluids (other than water) for 6 hours before your procedure  No water for 2 hours before your procedure  Take your blood pressure medicine with a sip of water  Arrive 30 minutes before your appointment  If sedation is planned, bring suitable driver.  [place], [place], & public transportation are NOT APPROVED)  Carefully read the Preparing for your procedure detailed instructions  If you have questions call us  at [phone]  Procedure appointments are for procedures  only.   NO medication refills or new problem evaluations will be done on procedure days.   Only the scheduled, pre-approved procedure and side will be done.   ______________________________________________________________________     ______________________________________________________________________    Preparing for your procedure  Appointments: If you think you may not be able to keep your appointment, call 24-48 hours in advance to cancel. We need time to make it available to others.  Procedure visits are for procedures only. During your procedure appointment there will be: NO Prescription Refills*. NO medication changes or discussions*. NO discussion of disability issues*. NO unrelated pain problem evaluations*. NO evaluations to order other pain procedures*. *These will be addressed at a separate and distinct evaluation encounter on the provider's evaluation schedule and not during procedure days.  Instructions: Food intake: Avoid eating anything solid for at least 8 hours prior to your procedure. Clear liquid intake: You may take clear liquids such as water up to 2 hours prior to your procedure. (No carbonated drinks. No soda.) Transportation: Unless otherwise stated by your physician, bring a driver. (Driver cannot be a Market Researcher, Pharmacist, Community, or any other form of public transportation.) Morning Medicines: Except for blood thinners, take all of your other morning medications with a sip of water. Make sure to take your heart and blood pressure medicines. If your blood pressure's lower number is above 100, the case will be rescheduled. Blood thinners: Make sure to stop your blood thinners as instructed.  If you take a blood thinner, but were not instructed to stop it,  call our office [phone] and ask to talk to a nurse. Not stopping a blood thinner prior to certain procedures could lead to serious complications. Diabetics on insulin: Notify the staff so that you can be scheduled 1st case in the morning. If your diabetes requires high dose insulin, take only  of your normal insulin dose the morning of the procedure and notify the staff that you have done so. Preventing infections: Shower with an antibacterial soap the morning of your procedure.  Build-up your immune system: Take 1000 mg of Vitamin C with every meal (3 times a day) the day prior to your procedure. Antibiotics: Inform the nursing staff if you are taking any antibiotics or if you have any conditions that may require antibiotics prior to procedures. (Example: recent joint implants)   Pregnancy: If you are pregnant make sure to notify the nursing staff. Not doing so may result in injury to the fetus, including death.  Sickness: If you have a cold, fever, or any active infections, call and cancel or reschedule your procedure. Receiving steroids while having an infection may result in complications. Arrival: You must be in the facility at least 30 minutes prior to your scheduled procedure. Tardiness: Your scheduled time is also the cutoff time. If you do not arrive at least 15 minutes prior to your procedure, you will be rescheduled.  Children: Do not bring any children with you. Make arrangements to keep them home. Dress appropriately: There is always a possibility that your clothing may get soiled. Avoid long dresses. Valuables: Do not bring any jewelry or valuables.  Reasons to call and reschedule or cancel your procedure: (Following these recommendations will minimize the risk of a serious complication.) Surgeries: Avoid having procedures within 2 weeks of any surgery. (Avoid for 2 weeks before or after any surgery). Flu Shots: Avoid having procedures within 2 weeks of a flu shots or . (Avoid  for 2 weeks before or after immunizations).  Barium: Avoid having a procedure within 7-10 days after having had a radiological study involving the use of radiological contrast. (Myelograms, Barium swallow or enema study). Heart attacks: Avoid any elective procedures or surgeries for the initial 6 months after a Myocardial Infarction (Heart Attack). Blood thinners: It is imperative that you stop these medications before procedures. Let us  know if you if you take any blood thinner.  Infection: Avoid procedures during or within two weeks of an infection (including chest colds or gastrointestinal problems). Symptoms associated with infections include: Localized redness, fever, chills, night sweats or profuse sweating, burning sensation when voiding, cough, congestion, stuffiness, runny nose, sore throat, diarrhea, nausea, vomiting, cold or Flu symptoms, recent or current infections. It is specially important if the infection is over the area that we intend to treat. Heart and lung problems: Symptoms that may suggest an active cardiopulmonary problem include: cough, chest pain, breathing difficulties or shortness of breath, dizziness, ankle swelling, uncontrolled high or unusually low blood pressure, and/or palpitations. If you are experiencing any of these symptoms, cancel your procedure and contact your primary care physician for an evaluation.  Remember:  Regular Business hours are:  Monday to Thursday 8:00 AM to 4:00 PM  Provider's Schedule: Eric Como, MD:  Procedure days: Tuesday and Thursday 7:30 AM to 4:00 PM  Wallie Sherry, MD:  Procedure days: Monday and Wednesday 7:30 AM to 4:00 PM Last  Updated: 06/16/2023 ______________________________________________________________________     ______________________________________________________________________    General Risks and Possible Complications  Patient Responsibilities: It is important that you read this as it is part of your  informed consent. It is our duty to inform you of the risks and possible complications associated with treatments offered to you. It is your responsibility as a patient to read this and to ask questions about anything that is not clear or that you believe was not covered in this document.  Patients Rights: You have the right to refuse treatment. You also have the right to change your mind, even after initially having agreed to have the treatment done. However, under this last option, if you wait until the last second to change your mind, you may be charged for the materials used up to that point.  Introduction: Medicine is not an visual merchandiser. Everything in Medicine, including the lack of treatment(s), carries the potential for danger, harm, or loss (which is by definition: Risk). In Medicine, a complication is a secondary problem, condition, or disease that can aggravate an already existing one. All treatments carry the risk of possible complications. The fact that a side effects or complications occurs, does not imply that the treatment was conducted incorrectly. It must be clearly understood that these can happen even when everything is done following the highest safety standards.  No treatment: You can choose not to proceed with the proposed treatment alternative. The PRO(s) would include: avoiding the risk of complications associated with the therapy. The CON(s) would include: not getting any of the treatment benefits. These benefits fall under one of three categories: diagnostic; therapeutic; and/or palliative. Diagnostic benefits include: getting information which can ultimately lead to improvement of the disease or symptom(s). Therapeutic benefits are those associated with the successful treatment of the disease. Finally, palliative benefits are those related to the decrease of the primary symptoms, without necessarily curing the condition (example: decreasing the pain from a flare-up of a  chronic condition, such as incurable terminal cancer).  General Risks and Complications: These are associated to most interventional treatments.  They can occur alone, or in combination. They fall under one of the following six (6) categories: no benefit or worsening of symptoms; bleeding; infection; nerve damage; allergic reactions; and/or death. No benefits or worsening of symptoms: In Medicine there are no guarantees, only probabilities. No healthcare provider can ever guarantee that a medical treatment will work, they can only state the probability that it may. Furthermore, there is always the possibility that the condition may worsen, either directly, or indirectly, as a consequence of the treatment. Bleeding: This is more common if the patient is taking a blood thinner, either prescription or over the counter (example: Goody Powders, Fish oil, Aspirin, Garlic, etc.), or if suffering a condition associated with impaired coagulation (example: Hemophilia, cirrhosis of the liver, low platelet counts, etc.). However, even if you do not have one on these, it can still happen. If you have any of these conditions, or take one of these drugs, make sure to notify your treating physician. Infection: This is more common in patients with a compromised immune system, either due to disease (example: diabetes, cancer, human immunodeficiency virus [HIV], etc.), or due to medications or treatments (example: therapies used to treat cancer and rheumatological diseases). However, even if you do not have one on these, it can still happen. If you have any of these conditions, or take one of these drugs, make sure to notify your treating physician. Nerve Damage: This is more common when the treatment is an invasive one, but it can also happen with the use of medications, such as those used in the treatment of cancer. The damage can occur to small secondary nerves, or to large primary ones, such as those in the spinal cord and  brain. This damage may be temporary or permanent and it may lead to impairments that can range from temporary numbness to permanent paralysis and/or brain death. Allergic Reactions: Any time a substance or material comes in contact with our body, there is the possibility of an allergic reaction. These can range from a mild skin rash (contact dermatitis) to a severe systemic reaction (anaphylactic reaction), which can result in death. Death: In general, any medical intervention can result in death, most of the time due to an unforeseen complication. ______________________________________________________________________

## 2024-06-15 NOTE — Progress Notes (Signed)
 Safety precautions to be maintained throughout the outpatient stay will include: orient to surroundings, keep bed in low position, maintain call bell within reach at all times, provide assistance with transfer out of bed and ambulation.

## 2024-06-24 ENCOUNTER — Ambulatory Visit
Admission: RE | Admit: 2024-06-24 | Discharge: 2024-06-24 | Disposition: A | Discharge status: Home / Self Care | Source: Ambulatory Visit | Attending: Pain Medicine | Admitting: Pain Medicine

## 2024-06-24 ENCOUNTER — Ambulatory Visit
Admission: RE | Admit: 2024-06-24 | Discharge: 2024-06-24 | Disposition: A | Discharge status: Home / Self Care | Attending: Pain Medicine | Admitting: Pain Medicine

## 2024-06-24 DIAGNOSIS — M25561 Pain in right knee: Secondary | ICD-10-CM | POA: Insufficient documentation

## 2024-06-24 DIAGNOSIS — G8929 Other chronic pain: Secondary | ICD-10-CM | POA: Diagnosis present

## 2024-06-24 DIAGNOSIS — M25562 Pain in left knee: Secondary | ICD-10-CM | POA: Insufficient documentation

## 2024-07-04 ENCOUNTER — Emergency Department

## 2024-07-04 ENCOUNTER — Other Ambulatory Visit: Payer: Self-pay

## 2024-07-04 ENCOUNTER — Emergency Department
Admission: EM | Admit: 2024-07-04 | Discharge: 2024-07-04 | Disposition: A | Discharge status: Home / Self Care | Attending: Emergency Medicine | Admitting: Emergency Medicine

## 2024-07-04 DIAGNOSIS — S20212A Contusion of left front wall of thorax, initial encounter: Secondary | ICD-10-CM | POA: Insufficient documentation

## 2024-07-04 DIAGNOSIS — M25512 Pain in left shoulder: Secondary | ICD-10-CM | POA: Insufficient documentation

## 2024-07-04 DIAGNOSIS — I10 Essential (primary) hypertension: Secondary | ICD-10-CM | POA: Diagnosis not present

## 2024-07-04 DIAGNOSIS — W19XXXA Unspecified fall, initial encounter: Secondary | ICD-10-CM | POA: Insufficient documentation

## 2024-07-04 DIAGNOSIS — S299XXA Unspecified injury of thorax, initial encounter: Secondary | ICD-10-CM | POA: Diagnosis present

## 2024-07-04 MED ORDER — CYCLOBENZAPRINE HCL 5 MG PO TABS: 5.0000 mg | ORAL_TABLET | Freq: Three times a day (TID) | ORAL | 0 refills | Status: DC | PRN

## 2024-07-04 MED ORDER — METHOCARBAMOL 500 MG PO TABS
500.0000 mg | ORAL_TABLET | Freq: Once | ORAL | Status: AC
  Administered 2024-07-04: 500 mg via ORAL
  Filled 2024-07-04: qty 1

## 2024-07-04 MED ORDER — LIDOCAINE 5 % EX PTCH
1.0000 | MEDICATED_PATCH | CUTANEOUS | Status: DC
  Administered 2024-07-04: 1 via TRANSDERMAL
  Filled 2024-07-04: qty 1

## 2024-07-04 MED ORDER — LIDOCAINE 5 % EX PTCH: 1.0000 | MEDICATED_PATCH | CUTANEOUS | 0 refills | Status: AC

## 2024-07-04 NOTE — ED Provider Notes (Signed)
 "   [facility] Emergency Department Provider Note     Event Date/Time   First MD Initiated Contact with Patient 07/04/24 1955     (approximate)   History   Fall   HPI  Peter Webster is a 83 y.o. male with a history of chronic back pain, HTN, HLD, multiple myeloma, and GERD, who presents to the ED endorsing injuries related to a mechanical fall.  Patient reports he tripped and fell last night, injuring his left shoulder and his left ribs.  He denies any head injury or LOC.  He presents to the ED today for evaluation of symptoms.  Patient presents via POV accompanied by family members.   Physical Exam   Triage Vital Signs: ED Triage Vitals  Encounter Vitals Group     BP 07/04/24 1654 100/75     Girls Systolic BP Percentile --      Girls Diastolic BP Percentile --      Boys Systolic BP Percentile --      Boys Diastolic BP Percentile --      Pulse Rate 07/04/24 1654 (!) 108     Resp 07/04/24 1654 18     Temp 07/04/24 1654 98 F (36.7 C)     Temp src --      SpO2 07/04/24 1654 98 %     Weight --      Height --      Head Circumference --      Peak Flow --      Pain Score 07/04/24 1653 8     Pain Loc --      Pain Education --      Exclude from Growth Chart --     Most recent vital signs: Vitals:   07/04/24 1654  BP: 100/75  Pulse: (!) 108  Resp: 18  Temp: 98 F (36.7 C)  SpO2: 98%    General Awake, no distress.  NAD HEENT NCAT. PERRL. EOMI. No rhinorrhea. Mucous membranes are moist.  CV:  Good peripheral perfusion.  RESP:  Normal effort.  CTA ABD:  No distention.  Soft and nontender MSK:  Left shoulder without any deformity or dislocation.  Mild tender palpation of the deltoid musculature.  Normal composite fist distally. NEURO: Cranial nerves II to XII grossly intact.   ED Results / Procedures / Treatments   Labs (all labs ordered are listed, but only abnormal results are displayed) Labs Reviewed - No data to  display   EKG   RADIOLOGY  I personally viewed and evaluated these images as part of my medical decision making, as well as reviewing the written report by the radiologist.  ED Provider Interpretation: No acute findings  DG Ribs Unilateral W/Chest Left Result Date: 07/04/2024 CLINICAL DATA:  Pain after trip and fall. EXAM: LEFT RIBS AND CHEST - 3+ VIEW COMPARISON:  None Available. FINDINGS: No evidence of displaced rib fracture. There is no pneumothorax or pleural effusion. Both lungs are clear. Heart size and mediastinal contours are within normal limits. IMPRESSION: No evidence of displaced rib fracture. No pulmonary complication related to fall. Electronically Signed   By: Andrea Gasman M.D.   On: 07/04/2024 19:10   DG Shoulder Left Result Date: 07/04/2024 CLINICAL DATA:  Pain after trip and fall. EXAM: LEFT SHOULDER - 2+ VIEW COMPARISON:  None Available. FINDINGS: Technically limited due to difficulty with positioning. There is no evidence of fracture or dislocation. Small subacromial spur. Soft tissues are unremarkable. IMPRESSION: No fracture or dislocation  of the left shoulder. Electronically Signed   By: Andrea Gasman M.D.   On: 07/04/2024 19:09     PROCEDURES:  Critical Care performed: No  Procedures   MEDICATIONS ORDERED IN ED: Medications  lidocaine  (LIDODERM ) 5 % 1 patch (has no administration in time range)  methocarbamol  (ROBAXIN ) tablet 500 mg (has no administration in time range)     IMPRESSION / MDM / ASSESSMENT AND PLAN / ED COURSE  I reviewed the triage vital signs and the nursing notes.                              Differential diagnosis includes, but is not limited to, chest wall contusion, rib fracture, pneumothorax, shoulder fracture, shoulder dislocation, shoulder sprain  Patient's presentation is most consistent with acute complicated illness / injury requiring diagnostic workup.  Patient's diagnosis is consistent with mechanical fall  resulting in chest wall contusion and left shoulder contusion.  Geriatric patient with return exam and workup at this time.  No x-ray evidence of any acute fracture or dislocation.  No evidence of any pneumothorax on my interpretation of images of the chest.  Patient will be discharged home with prescriptions for Lidoderm  patches and cyclobenzaprine . Patient is to follow up with his PCP or Ortho as suggested, as needed or otherwise directed. Patient is given ED precautions to return to the ED for any worsening or new symptoms.     FINAL CLINICAL IMPRESSION(S) / ED DIAGNOSES   Final diagnoses:  Chest wall contusion, left, initial encounter  Acute pain of left shoulder     Rx / DC Orders   ED Discharge Orders          Ordered    lidocaine  (LIDODERM ) 5 %  Every 24 hours        07/04/24 2013    cyclobenzaprine  (FLEXERIL ) 5 MG tablet  3 times daily PRN        07/04/24 2013             Note:  This document was prepared using Dragon voice recognition software and may include unintentional dictation errors.    Loyd Candida LULLA Aldona, PA-C 07/04/24 2034    Claudene Rover, MD 07/04/24 2207  "

## 2024-07-04 NOTE — Discharge Instructions (Addendum)
 Take the prescription meds as directed. Follow-up with Ortho as directed.

## 2024-07-04 NOTE — ED Triage Notes (Signed)
 Pt comes with trip and fall last night. Pt states he injured his left side rib and shoulder. Pt denies any loc or hitting head.

## 2024-07-14 ENCOUNTER — Ambulatory Visit: Admitting: Pain Medicine

## 2024-07-28 ENCOUNTER — Ambulatory Visit: Admitting: Pain Medicine

## 2024-07-28 DIAGNOSIS — E785 Hyperlipidemia, unspecified: Secondary | ICD-10-CM | POA: Insufficient documentation

## 2024-07-28 DIAGNOSIS — M170 Bilateral primary osteoarthritis of knee: Secondary | ICD-10-CM | POA: Insufficient documentation

## 2024-07-28 DIAGNOSIS — S32030S Wedge compression fracture of third lumbar vertebra, sequela: Secondary | ICD-10-CM | POA: Insufficient documentation

## 2024-07-28 DIAGNOSIS — Z5189 Encounter for other specified aftercare: Secondary | ICD-10-CM

## 2024-07-28 DIAGNOSIS — Z91199 Patient's noncompliance with other medical treatment and regimen due to unspecified reason: Secondary | ICD-10-CM

## 2024-07-28 NOTE — Progress Notes (Signed)
 Department: [facility]) Date: 07/28/2024  Event: NO SHOW.  Encounter Type: Procedure.          Advance notice: None.  Reason: Unknown.          Significance: Unintended waste of community medical resources.          Note: Patient will R/S

## 2024-07-28 NOTE — Patient Instructions (Signed)
 ______________________________________________________________________    Post-Procedure Discharge Instructions  INSTRUCTIONS Apply ice:  Purpose: This will minimize any swelling and discomfort after procedure.  When: Day of procedure, as soon as you get home. How: Fill a plastic sandwich bag with crushed ice. Cover it with a small towel and apply to injection site. How long: (15 min on, 15 min off) Apply for 15 minutes then remove x 15 minutes.  Repeat sequence on day of procedure, until you go to bed. Apply heat:  Purpose: To treat any soreness and discomfort from the procedure. When: Starting the next day after the procedure. How: Apply heat to procedure site starting the day following the procedure. How long: May continue to repeat daily, until discomfort goes away. Food intake: Start with clear liquids (like water) and advance to regular food, as tolerated.  Physical activities: Keep activities to a minimum for the first 8 hours after the procedure. After that, then as tolerated. Driving: If you have received any sedation, be responsible and do not drive. You are not allowed to drive for 24 hours after having sedation. Blood thinner: (Applies only to those taking blood thinners) You may restart your blood thinner 6 hours after your procedure. Insulin: (Applies only to Diabetic patients taking insulin) As soon as you can eat, you may resume your normal dosing schedule. Infection prevention: Keep procedure site clean and dry. Shower daily and clean area with soap and water.  PAIN DIARY Post-procedure Pain Diary: Extremely important that this be done correctly and accurately. Recorded information will be used to determine the next step in treatment. For the purpose of accuracy, follow these rules: Evaluate only the area treated. Do not report or include pain from an untreated area. For the purpose of this evaluation, ignore all other areas of pain, except for the treated area. After your  procedure, avoid taking a long nap and attempting to complete the pain diary after you wake up. Instead, set your alarm clock to go off every hour, on the hour, for the initial 8 hours after the procedure. Document the duration of the numbing medicine, and the relief you are getting from it. Do not go to sleep and attempt to complete it later. It will not be accurate. If you received sedation, it is likely that you were given a medication that may cause amnesia. Because of this, completing the diary at a later time may cause the information to be inaccurate. This information is needed to plan your care. Follow-up appointment: Keep your post-procedure follow-up evaluation appointment after the procedure (usually 2 weeks for most procedures, 6 weeks for radiofrequencies). DO NOT FORGET to bring you pain diary with you.   EXPECT... (What should I expect to see with my procedure?) From numbing medicine (AKA: Local Anesthetics): Numbness or decrease in pain. You may also experience some weakness, which if present, could last for the duration of the local anesthetic. Onset: Full effect within 15 minutes of injected. Duration: It will depend on the type of local anesthetic used. On the average, 1 to 8 hours.  From steroids (Applies only if steroids were used): Decrease in swelling or inflammation. Once inflammation is improved, relief of the pain will follow. Onset of benefits: Depends on the amount of swelling present. The more swelling, the longer it will take for the benefits to be seen. In some cases, up to 10 days. Duration: Steroids will stay in the system x 2 weeks. Duration of benefits will depend on multiple posibilities including persistent irritating  factors. Side-effects: If present, they may typically last 2 weeks (the duration of the steroids). Frequent: Cramps (if they occur, drink Gatorade and take over-the-counter Magnesium 450-500 mg once to twice a day); water retention with temporary weight  gain; increases in blood sugar; decreased immune system response; increased appetite. Occasional: Facial flushing (red, warm cheeks); mood swings; menstrual changes. Uncommon: Long-term decrease or suppression of natural hormones; bone thinning. (These are more common with higher doses or more frequent use. This is why we prefer that our patients avoid having any injection therapies in other practices.)  Very Rare: Severe mood changes; psychosis; aseptic necrosis. From procedure: Some discomfort is to be expected once the numbing medicine wears off. This should be minimal if ice and heat are applied as instructed.  CALL IF... (When should I call?) You experience numbness and weakness that gets worse with time, as opposed to wearing off. New onset bowel or bladder incontinence. (Applies only to procedures done in the spine)  Emergency Numbers: Durning business hours (Monday - Thursday, 8:00 AM - 4:00 PM) (Friday, 9:00 AM - 12:00 Noon): [phone] After hours: [phone] NOTE: If you are having a problem and are unable connect with, or to talk to a provider, then go to your nearest urgent care or emergency department. If the problem is serious and urgent, please call 911. ______________________________________________________________________     ______________________________________________________________________    Steroid injections  Common steroids for injections Triamcinolone : Used by many sports medicine physicians for large joint and bursal injections, often combined with a local anesthetic like lidocaine . A study focusing on coccydynia (tailbone pain) found triamcinolone  was more effective than betamethasone, suggesting it may also be preferable for other localized inflammation conditions. Methylprednisolone: A common alternative to triamcinolone  that is also a strong anti-inflammatory. It is available in different formulations, with the acetate suspension being the long-acting  option for intra-articular injections. Dexamethasone : This is a non-particulate steroid, meaning it has a lower risk of tissue damage compared to particulate steroids like triamcinolone  and methylprednisolone. While less common for this specific use, it is an option for targeted injections.   Considerations for physicians Particulate vs. non-particulate steroids: Triamcinolone  and methylprednisolone are particulate, meaning they can clump together. Dexamethasone  is non-particulate. Particulate steroids are often preferred for their longer-lasting effects but carry a theoretical higher risk for certain injections (though this is less of a concern in the costochondral joints). Combined injectate: Corticosteroids are typically mixed with a local anesthetic like lidocaine  to provide both immediate pain relief (from the anesthetic) and longer-term inflammation reduction (from the steroid). Imaging guidance: To ensure accurate placement of the needle and medication, physicians may use ultrasound or fluoroscopic guidance for the injection, especially in complex or refractory cases.   Patient guidance Before undergoing a steroid injection, discuss the options with your physician. They will determine the best steroid, dosage, and procedure for your specific case based on factors like: Severity of your condition History of response to other treatments Your overall health status Experience and preference of the physician  Last  Updated: 03/01/2024 ______________________________________________________________________

## 2024-07-29 ENCOUNTER — Emergency Department

## 2024-07-29 ENCOUNTER — Other Ambulatory Visit: Payer: Self-pay

## 2024-07-29 ENCOUNTER — Inpatient Hospital Stay
Admission: EM | Admit: 2024-07-29 | Discharge: 2024-08-01 | DRG: 872 | Disposition: A | Discharge status: Home / Self Care

## 2024-07-29 DIAGNOSIS — Z888 Allergy status to other drugs, medicaments and biological substances status: Secondary | ICD-10-CM | POA: Diagnosis not present

## 2024-07-29 DIAGNOSIS — Z79899 Other long term (current) drug therapy: Secondary | ICD-10-CM | POA: Diagnosis not present

## 2024-07-29 DIAGNOSIS — M4854XA Collapsed vertebra, not elsewhere classified, thoracic region, initial encounter for fracture: Secondary | ICD-10-CM | POA: Diagnosis present

## 2024-07-29 DIAGNOSIS — N189 Chronic kidney disease, unspecified: Secondary | ICD-10-CM | POA: Diagnosis present

## 2024-07-29 DIAGNOSIS — A4150 Gram-negative sepsis, unspecified: Principal | ICD-10-CM

## 2024-07-29 DIAGNOSIS — S22080A Wedge compression fracture of T11-T12 vertebra, initial encounter for closed fracture: Secondary | ICD-10-CM

## 2024-07-29 DIAGNOSIS — R748 Abnormal levels of other serum enzymes: Secondary | ICD-10-CM | POA: Diagnosis present

## 2024-07-29 DIAGNOSIS — K219 Gastro-esophageal reflux disease without esophagitis: Secondary | ICD-10-CM | POA: Diagnosis present

## 2024-07-29 DIAGNOSIS — N390 Urinary tract infection, site not specified: Principal | ICD-10-CM

## 2024-07-29 DIAGNOSIS — Z8579 Personal history of other malignant neoplasms of lymphoid, hematopoietic and related tissues: Secondary | ICD-10-CM

## 2024-07-29 DIAGNOSIS — S22080G Wedge compression fracture of T11-T12 vertebra, subsequent encounter for fracture with delayed healing: Secondary | ICD-10-CM

## 2024-07-29 DIAGNOSIS — Z823 Family history of stroke: Secondary | ICD-10-CM | POA: Diagnosis not present

## 2024-07-29 DIAGNOSIS — E785 Hyperlipidemia, unspecified: Secondary | ICD-10-CM | POA: Diagnosis present

## 2024-07-29 DIAGNOSIS — I10 Essential (primary) hypertension: Secondary | ICD-10-CM | POA: Diagnosis not present

## 2024-07-29 DIAGNOSIS — Z8551 Personal history of malignant neoplasm of bladder: Secondary | ICD-10-CM

## 2024-07-29 DIAGNOSIS — G894 Chronic pain syndrome: Secondary | ICD-10-CM | POA: Diagnosis present

## 2024-07-29 DIAGNOSIS — R54 Age-related physical debility: Secondary | ICD-10-CM | POA: Diagnosis present

## 2024-07-29 DIAGNOSIS — G4733 Obstructive sleep apnea (adult) (pediatric): Secondary | ICD-10-CM | POA: Diagnosis present

## 2024-07-29 DIAGNOSIS — G8929 Other chronic pain: Secondary | ICD-10-CM

## 2024-07-29 DIAGNOSIS — I129 Hypertensive chronic kidney disease with stage 1 through stage 4 chronic kidney disease, or unspecified chronic kidney disease: Secondary | ICD-10-CM | POA: Diagnosis present

## 2024-07-29 DIAGNOSIS — A419 Sepsis, unspecified organism: Secondary | ICD-10-CM

## 2024-07-29 DIAGNOSIS — M51369 Other intervertebral disc degeneration, lumbar region without mention of lumbar back pain or lower extremity pain: Secondary | ICD-10-CM | POA: Diagnosis present

## 2024-07-29 DIAGNOSIS — D696 Thrombocytopenia, unspecified: Secondary | ICD-10-CM | POA: Diagnosis present

## 2024-07-29 DIAGNOSIS — M4856XA Collapsed vertebra, not elsewhere classified, lumbar region, initial encounter for fracture: Secondary | ICD-10-CM | POA: Diagnosis present

## 2024-07-29 DIAGNOSIS — S32030S Wedge compression fracture of third lumbar vertebra, sequela: Secondary | ICD-10-CM

## 2024-07-29 DIAGNOSIS — S32040A Wedge compression fracture of fourth lumbar vertebra, initial encounter for closed fracture: Secondary | ICD-10-CM

## 2024-07-29 DIAGNOSIS — B9620 Unspecified Escherichia coli [E. coli] as the cause of diseases classified elsewhere: Secondary | ICD-10-CM | POA: Diagnosis present

## 2024-07-29 LAB — COMPREHENSIVE METABOLIC PANEL WITH GFR
ALT: 38 U/L (ref 0–44)
AST: 49 U/L — ABNORMAL HIGH (ref 15–41)
Albumin: 3.8 g/dL (ref 3.5–5.0)
Alkaline Phosphatase: 143 U/L — ABNORMAL HIGH (ref 38–126)
Anion gap: 13 (ref 5–15)
BUN: 30 mg/dL — ABNORMAL HIGH (ref 8–23)
CO2: 21 mmol/L — ABNORMAL LOW (ref 22–32)
Calcium: 10.5 mg/dL — ABNORMAL HIGH (ref 8.9–10.3)
Chloride: 104 mmol/L (ref 98–111)
Creatinine, Ser: 1.94 mg/dL — ABNORMAL HIGH (ref 0.61–1.24)
GFR, Estimated: 34 mL/min — ABNORMAL LOW
Glucose, Bld: 141 mg/dL — ABNORMAL HIGH (ref 70–99)
Potassium: 4.1 mmol/L (ref 3.5–5.1)
Sodium: 138 mmol/L (ref 135–145)
Total Bilirubin: 0.7 mg/dL (ref 0.0–1.2)
Total Protein: 7.7 g/dL (ref 6.5–8.1)

## 2024-07-29 LAB — URINALYSIS, ROUTINE W REFLEX MICROSCOPIC
Bilirubin Urine: NEGATIVE
Glucose, UA: >=500 mg/dL — AB
Ketones, ur: NEGATIVE mg/dL
Nitrite: NEGATIVE
Protein, ur: 100 mg/dL — AB
RBC / HPF: >50 RBC/hpf (ref 0–5)
Specific Gravity, Urine: 1.018 (ref 1.005–1.030)
Squamous Epithelial / HPF: 0 /HPF (ref 0–5)
WBC, UA: >50 WBC/hpf (ref 0–5)
pH: 5 (ref 5.0–8.0)

## 2024-07-29 LAB — CBC
HCT: 42.9 % (ref 39.0–52.0)
Hemoglobin: 14.1 g/dL (ref 13.0–17.0)
MCH: 32.6 pg (ref 26.0–34.0)
MCHC: 32.9 g/dL (ref 30.0–36.0)
MCV: 99.1 fL (ref 80.0–100.0)
Platelets: 61 K/uL — ABNORMAL LOW (ref 150–400)
RBC: 4.33 MIL/uL (ref 4.22–5.81)
RDW: 17.2 % — ABNORMAL HIGH (ref 11.5–15.5)
WBC: 4.8 K/uL (ref 4.0–10.5)
nRBC: 0 % (ref 0.0–0.2)

## 2024-07-29 LAB — LIPASE, BLOOD: Lipase: 534 U/L — ABNORMAL HIGH (ref 11–51)

## 2024-07-29 MED ORDER — SODIUM CHLORIDE 0.9 % IV SOLN
1.0000 g | INTRAVENOUS | Status: AC
  Administered 2024-07-29: 1 g via INTRAVENOUS
  Filled 2024-07-29: qty 10

## 2024-07-29 MED ORDER — SODIUM CHLORIDE 0.9 % IV SOLN
1.0000 g | INTRAVENOUS | Status: DC
  Administered 2024-07-30 – 2024-07-31 (×2): 1 g via INTRAVENOUS
  Filled 2024-07-29 (×3): qty 10

## 2024-07-29 MED ORDER — ROSUVASTATIN CALCIUM 20 MG PO TABS
20.0000 mg | ORAL_TABLET | Freq: Every day | ORAL | Status: DC
  Administered 2024-07-30 – 2024-08-01 (×3): 20 mg via ORAL
  Filled 2024-07-29 (×3): qty 1

## 2024-07-29 MED ORDER — ONDANSETRON HCL 4 MG/2ML IJ SOLN: 4.0000 mg | Freq: Four times a day (QID) | INTRAMUSCULAR | Status: DC | PRN

## 2024-07-29 MED ORDER — ACETAMINOPHEN 325 MG PO TABS
650.0000 mg | ORAL_TABLET | Freq: Four times a day (QID) | ORAL | Status: DC | PRN
  Administered 2024-07-29: 650 mg via ORAL
  Filled 2024-07-29: qty 2

## 2024-07-29 MED ORDER — ACETAMINOPHEN 650 MG RE SUPP: 650.0000 mg | Freq: Four times a day (QID) | RECTAL | Status: DC | PRN

## 2024-07-29 MED ORDER — CARBIDOPA-LEVODOPA 25-100 MG PO TABS: 1.0000 | ORAL_TABLET | Freq: Three times a day (TID) | ORAL | Status: DC

## 2024-07-29 MED ORDER — OYSTER SHELL CALCIUM/D3 500-5 MG-MCG PO TABS
2.0000 | ORAL_TABLET | Freq: Every day | ORAL | Status: DC
  Administered 2024-07-30 – 2024-08-01 (×3): 2 via ORAL
  Filled 2024-07-29 (×3): qty 2

## 2024-07-29 MED ORDER — LACTATED RINGERS IV SOLN
150.0000 mL/h | INTRAVENOUS | Status: DC
  Administered 2024-07-29 – 2024-07-30 (×2): 150 mL/h via INTRAVENOUS

## 2024-07-29 MED ORDER — MORPHINE SULFATE (PF) 2 MG/ML IV SOLN
2.0000 mg | INTRAVENOUS | Status: DC | PRN
  Administered 2024-07-30: 2 mg via INTRAVENOUS
  Filled 2024-07-29 (×2): qty 1

## 2024-07-29 MED ORDER — PANTOPRAZOLE SODIUM 40 MG PO TBEC
40.0000 mg | DELAYED_RELEASE_TABLET | Freq: Every day | ORAL | Status: DC
  Administered 2024-07-30 – 2024-08-01 (×3): 40 mg via ORAL
  Filled 2024-07-29 (×2): qty 1

## 2024-07-29 MED ORDER — AMMONIUM LACTATE 12 % EX LOTN: 1.0000 | TOPICAL_LOTION | CUTANEOUS | Status: DC | PRN

## 2024-07-29 MED ORDER — LORATADINE 10 MG PO TABS
10.0000 mg | ORAL_TABLET | Freq: Every day | ORAL | Status: DC
  Administered 2024-07-30 – 2024-08-01 (×3): 10 mg via ORAL
  Filled 2024-07-29 (×3): qty 1

## 2024-07-29 MED ORDER — ONDANSETRON HCL 4 MG PO TABS: 4.0000 mg | ORAL_TABLET | Freq: Four times a day (QID) | ORAL | Status: DC | PRN

## 2024-07-29 MED ORDER — ENOXAPARIN SODIUM 60 MG/0.6ML IJ SOSY
50.0000 mg | PREFILLED_SYRINGE | INTRAMUSCULAR | Status: DC
  Administered 2024-07-30 – 2024-08-01 (×3): 50 mg via SUBCUTANEOUS
  Filled 2024-07-29 (×3): qty 0.6

## 2024-07-29 MED ORDER — LOSARTAN POTASSIUM 25 MG PO TABS
25.0000 mg | ORAL_TABLET | Freq: Every day | ORAL | Status: DC
  Administered 2024-07-30 – 2024-08-01 (×3): 25 mg via ORAL
  Filled 2024-07-29 (×3): qty 1

## 2024-07-29 MED ORDER — AMLODIPINE BESYLATE 5 MG PO TABS
5.0000 mg | ORAL_TABLET | Freq: Every day | ORAL | Status: DC
  Administered 2024-07-30 – 2024-08-01 (×3): 5 mg via ORAL
  Filled 2024-07-29 (×3): qty 1

## 2024-07-29 MED ORDER — GABAPENTIN 300 MG PO CAPS
300.0000 mg | ORAL_CAPSULE | Freq: Every day | ORAL | Status: DC
  Administered 2024-07-29 – 2024-07-31 (×3): 300 mg via ORAL
  Filled 2024-07-29 (×3): qty 1

## 2024-07-29 MED ORDER — MAGNESIUM HYDROXIDE 400 MG/5ML PO SUSP: 30.0000 mL | Freq: Every day | ORAL | Status: DC | PRN

## 2024-07-29 MED ORDER — TIZANIDINE HCL 4 MG PO TABS
4.0000 mg | ORAL_TABLET | Freq: Three times a day (TID) | ORAL | Status: DC | PRN
  Administered 2024-07-30 – 2024-08-01 (×4): 4 mg via ORAL
  Filled 2024-07-29 (×4): qty 1

## 2024-07-29 MED ORDER — FLUOXETINE HCL 20 MG PO CAPS
20.0000 mg | ORAL_CAPSULE | Freq: Every day | ORAL | Status: DC
  Administered 2024-07-30 – 2024-08-01 (×3): 20 mg via ORAL
  Filled 2024-07-29 (×3): qty 1

## 2024-07-29 MED ORDER — TRAZODONE HCL 50 MG PO TABS: 25.0000 mg | ORAL_TABLET | Freq: Every evening | ORAL | Status: DC | PRN

## 2024-07-29 MED ORDER — OXYCODONE-ACETAMINOPHEN 5-325 MG PO TABS
1.0000 | ORAL_TABLET | ORAL | Status: AC
  Administered 2024-07-29: 1 via ORAL
  Filled 2024-07-29: qty 1

## 2024-07-29 MED ORDER — FAMOTIDINE 20 MG PO TABS
20.0000 mg | ORAL_TABLET | Freq: Every day | ORAL | Status: DC
  Administered 2024-07-30 – 2024-08-01 (×3): 20 mg via ORAL
  Filled 2024-07-29 (×3): qty 1

## 2024-07-29 NOTE — ED Notes (Signed)
 Bed alarm, socks, and fall risk bracelet on. Stretcher locked and in lowest position

## 2024-07-29 NOTE — Plan of Care (Signed)
  Problem: Pain Managment: Goal: General experience of comfort will improve and/or be controlled Outcome: Progressing   Problem: Safety: Goal: Ability to remain free from injury will improve Outcome: Progressing   Problem: Skin Integrity: Goal: Risk for impaired skin integrity will decrease Outcome: Progressing

## 2024-07-29 NOTE — H&P (Signed)
 "     Peter Webster   PATIENT NAME: Peter Webster    MR#:  [id number]  DATE OF BIRTH:  04-14-1941  DATE OF ADMISSION:  07/29/2024  PRIMARY CARE PHYSICIAN: Administration, [facility]   Patient is coming from: Home  REQUESTING/REFERRING PHYSICIAN: Dicky Anes, MD  CHIEF COMPLAINT:   Chief Complaint  Patient presents with   Back Pain   Fall   Urinary Frequency    HISTORY OF PRESENT ILLNESS:  Peter Webster is a 84 y.o. Caucasian male with medical history significant for osteoarthritis, GERD, DDD, chronic pain syndrome, hypertension, dyslipidemia, multiple myeloma, and OSA on CPAP, who presented to the emergency room with acute onset of mechanical fall.  The patient stated that he lost balance.  He denied any presyncope or syncope.  No head injuries.  He fell on his back with subsequent worsening mid back pain.  He admitted to urinary frequency and urgency without dysuria or hematuria.  No fever or chills.  He admitted to bilateral flank pain.  No cough or wheezing or dyspnea.  No chest pain or palpitations.  No new paresthesias or focal muscle weakness.  ED Course: When the patient came to the ER , Heart rate was 107 and respiratory rate was 20 with otherwise normal vital signs.  Labs revealed BUN of 30 with a creatinine of 1.94 close to baseline and blood glucose was 141.  Serum lipase was 534 and AST 49, alkaline phosphatase 143, with otherwise unremarkable CMP. EKG as reviewed by me : None Imaging: 2 view thoracic spine x-ray revealed the following: 1. No acute fracture or listhesis of the thoracic spine. 2. Chronic superior endplate fracture of T12. 3. Exaggerated thoracic kyphosis.  Abdominal and pelvic CT scan revealed the following:  1. Diffuse bladder wall thickening with perivesical stranding and intravesical gas, most consistent with cystitis due to a gas-forming organism, with catheter-related intravesical gas as an alternative consideration and fistula less likely. 2. No  acute traumatic abdominopelvic injury identified. 3. Mild hiatal hernia with distal esophageal dilatation, which may be due to reflux or dysmotility.  The patient was given 1 p.o. Percocet and 1 g of IV Rocephin .  The patient will be admitted to a telemetry bed for further evaluation and management. PAST MEDICAL HISTORY:   Past Medical History:  Diagnosis Date   Acute kidney failure    due to chemo   Allergy    Arthritis    Bladder cancer (HCC)    Chronic pain syndrome    DDD (degenerative disc disease), lumbar    ED (erectile dysfunction)    Elevated PSA    GERD (gastroesophageal reflux disease)    Headache    due to sinuses   Hernia of anterior abdominal wall    Hyperlipidemia    Hypertension    Multiple myeloma not having achieved remission (HCC)    Right inguinal hernia    Sleep apnea    uses CPAP   Thrombocytopenia    Wedge compression fracture of t11-T12 vertebra, sequela     PAST SURGICAL HISTORY:   Past Surgical History:  Procedure Laterality Date   bilateral hernia  2018   COLONOSCOPY  2019   INSERTION OF MESH  12/18/2021   Procedure: INSERTION OF MESH;  Surgeon: Lane Shope, MD;  Location: [facility] ORS;  Service: General;;   XI ROBOTIC ASSISTED VENTRAL HERNIA N/A 12/18/2021   Procedure: XI ROBOTIC ASSISTED VENTRAL HERNIA, incisional;  Surgeon: Lane Shope, MD;  Location: [facility] ORS;  Service: General;  Laterality: N/A;    SOCIAL HISTORY:   Social History   Tobacco Use   Smoking status: Never   Smokeless tobacco: Never  Substance Use Topics   Alcohol use: Yes    Alcohol/week: 1.0 standard drink of alcohol    Types: 1 Cans of beer per week    Comment: occ beer    FAMILY HISTORY:   Family History  Problem Relation Age of Onset   Stroke Mother    Cervical cancer Mother     DRUG ALLERGIES:  Allergies[1]  REVIEW OF SYSTEMS:   ROS As per history of present illness. All pertinent systems were reviewed above. Constitutional, HEENT,  cardiovascular, respiratory, GI, GU, musculoskeletal, neuro, psychiatric, endocrine, integumentary and hematologic systems were reviewed and are otherwise negative/unremarkable except for positive findings mentioned above in the HPI.   MEDICATIONS AT HOME:   Prior to Admission medications  Medication Sig Start Date End Date Taking? Authorizing Provider  acetaminophen  (TYLENOL ) 325 MG tablet Take 650 mg by mouth every 6 (six) hours as needed.    [provider]  acyclovir (ZOVIRAX) 200 MG capsule Take 200 mg by mouth 2 (two) times daily. 08/13/21   [provider]  amLODipine  (NORVASC ) 5 MG tablet Take 5 mg by mouth every morning. 07/22/21   [provider]  ammonium lactate  (LAC-HYDRIN ) 12 % lotion Apply 1 Application topically as needed for dry skin. 02/05/23   [provider]  Calcium  Carb-Cholecalciferol  500-2.5 MG-MCG CHEW Chew 2 tablets by mouth daily. 04/11/21   [provider]  carbidopa -levodopa  (SINEMET  IR) 25-100 MG tablet Take 1 tablet by mouth 3 (three) times daily. 1 1/2 tablet three times per day    [provider]  cetirizine (ZYRTEC) 10 MG tablet Take 10 mg by mouth every morning. 04/24/21   [provider]  clobetasol  ointment (TEMOVATE ) 0.05 % 1 application daily as needed (on face and Genitals). 04/11/21   [provider]  cyclobenzaprine  (FLEXERIL ) 5 MG tablet Take 1 tablet (5 mg total) by mouth 3 (three) times daily as needed. 07/04/24   Menshew, Candida LULLA Kings, PA-C  Docusate Sodium (DSS) 100 MG CAPS 1 capsule daily. 07/26/21   [provider]  famotidine  (PEPCID ) 20 MG tablet Take 20 mg by mouth daily. 03/24/23   [provider]  FLUoxetine  (PROZAC ) 20 MG capsule Take 20 mg by mouth daily. 01/23/23   [provider]  gabapentin  (NEURONTIN ) 300 MG capsule Take by mouth. TAKE ONE CAPSULE BY MOUTH AT BEDTIME FOR NEUROPATHIC PAIN *TAKE ONLY ONE 600MG  TABLET IN THE MORNING!* 04/24/23    [provider]  hydrocortisone  2.5 % ointment 1 application daily as needed (Scaling). 11/11/19   [provider]  losartan  (COZAAR ) 25 MG tablet Take 25 mg by mouth daily. 04/24/23   [provider]  pantoprazole  (PROTONIX ) 40 MG tablet Take 40 mg by mouth daily. 07/24/23   [provider]  polyvinyl alcohol (LIQUIFILM TEARS) 1.4 % ophthalmic solution Apply to eye. 01/23/23   [provider]  rosuvastatin  (CRESTOR ) 20 MG tablet Take 20 mg by mouth daily.    [provider]  simvastatin (ZOCOR) 80 MG tablet Take 80 mg by mouth daily at 6 PM. 04/24/21   [provider]  Suzetrigine  (JOURNAVX ) 50 MG TABS Take 50 mg by mouth every 12 (twelve) hours. 03/22/24   Patel, Seema K, NP  tiZANidine  (ZANAFLEX ) 4 MG tablet Take 4 mg by mouth as needed. 08/14/21   [provider]  traMADol  (ULTRAM ) 50 MG tablet Take 1 tablet (50 mg total) by mouth every 12 (twelve) hours as needed. 01/26/24   Lateef, Bilal, MD  triamcinolone  ointment (KENALOG ) 0.1 % 1 application daily as needed (Dry skin). 04/11/21   [provider]  urea (CARMOL) 20 % cream 1 Application daily at 6 (six) AM. 07/11/21   [provider]      VITAL SIGNS:  Blood pressure 131/73, pulse 100, temperature 98.2 F (36.8 C), resp. rate 19, height 5' 11 (1.803 m), weight 97.5 kg, SpO2 100%.  PHYSICAL EXAMINATION:  Physical Exam  GENERAL:  84 y.o.-year-old Caucasian male patient lying in the bed with no acute distress.  EYES: Pupils equal, round, reactive to light and accommodation. No scleral icterus. Extraocular muscles intact.  HEENT: Head atraumatic, normocephalic. Oropharynx and nasopharynx clear.  NECK:  Supple, no jugular venous distention. No thyroid enlargement, no tenderness.  LUNGS: Normal breath sounds bilaterally, no wheezing, rales,rhonchi or crepitation. No use of accessory muscles of respiration.  CARDIOVASCULAR: Regular rate and rhythm, S1, S2  normal. No murmurs, rubs, or gallops.  ABDOMEN: Soft, nondistended, nontender. Bowel sounds present. No organomegaly or mass.  EXTREMITIES: No pedal edema, cyanosis, or clubbing.  NEUROLOGIC: Cranial nerves II through XII are intact. Muscle strength 5/5 in all extremities. Sensation intact. Gait not checked. Musculoskeletal: Mid back tenderness at T12 level. PSYCHIATRIC: The patient is alert and oriented x 3.  Normal affect and good eye contact. SKIN: No obvious rash, lesion, or ulcer.   LABORATORY PANEL:   CBC Recent Labs  Lab 07/29/24 1620  WBC 4.8  HGB 14.1  HCT 42.9  PLT 61*   ------------------------------------------------------------------------------------------------------------------  Chemistries  Recent Labs  Lab 07/29/24 1620  NA 138  K 4.1  CL 104  CO2 21*  GLUCOSE 141*  BUN 30*  CREATININE 1.94*  CALCIUM  10.5*  AST 49*  ALT 38  ALKPHOS 143*  BILITOT 0.7   ------------------------------------------------------------------------------------------------------------------  Cardiac Enzymes No results for input(s): TROPONINI in the last 168 hours. ------------------------------------------------------------------------------------------------------------------  RADIOLOGY:  CT ABDOMEN PELVIS WO CONTRAST Result Date: 07/29/2024 EXAM: CT ABDOMEN AND PELVIS WITHOUT CONTRAST 07/29/2024 08:34:16 PM TECHNIQUE: CT of the abdomen and pelvis was performed without the administration of intravenous contrast. Multiplanar reformatted images are provided for review. Automated exposure control, iterative reconstruction, and/or weight-based adjustment of the mA/kV was utilized to reduce the radiation dose to as low as reasonably achievable. COMPARISON: CT chest 01/27/2024. CLINICAL HISTORY: Abdominal trauma, blunt; back pain; possible epigastric pain; possible urinary tract infection. Patient fell about 1 week ago. FINDINGS: LOWER CHEST: Ground glass opacities or atelectasis  demonstrated in the lung bases. LIVER: The liver is unremarkable. GALLBLADDER AND BILE DUCTS: Gallbladder is unremarkable. No biliary ductal dilatation. SPLEEN: No acute abnormality. PANCREAS: No acute abnormality. ADRENAL GLANDS: No acute abnormality. KIDNEYS, URETERS AND BLADDER: Bilateral renal cysts. The largest on the left measures 4.2 cm in diameter. Visualized kidneys are unchanged. No imaging follow-up is indicated. Per consensus, no follow-up is needed for simple Bosniak type 1 and 2 renal cysts, unless the patient has a malignancy history or risk factors. No hydronephrosis or hydroureter. No renal or ureteral stones. No perinephric or periureteral stranding. Diffuse bladder wall thickening with stranding in the pelvic fat and gas in the bladder. This is likely to indicate cystitis with a gas-forming organism. Gas could also have been instilled into the bladder by catheterization if this procedure has been performed. Less likely, this would be due to a fistula. GI AND BOWEL:  Mild esophageal hiatal hernia. Dilated distal esophagus may be due to reflux or dysmotility. Stomach demonstrates no acute abnormality. Small bowel and colon are not abnormally distended. No inflammatory infiltration around the bowel. There is no bowel obstruction. PERITONEUM AND RETROPERITONEUM: No ascites. No free air. No free fluid in the abdomen. VASCULATURE: Calcification of the aorta without aneurysm. Aorta is normal in caliber. LYMPH NODES: No significant lymphadenopathy. REPRODUCTIVE ORGANS: Prostate gland is not enlarged. BONES AND SOFT TISSUES: Postoperative changes consistent with mesh hernia repair in the low pelvis. Small periumbilical hernia containing fat. Degenerative changes in the hips. No acute osseous abnormality. No focal soft tissue abnormality. See additional report of CT lumbar spine. IMPRESSION: 1. Diffuse bladder wall thickening with perivesical stranding and intravesical gas, most consistent with cystitis due  to a gas-forming organism, with catheter-related intravesical gas as an alternative consideration and fistula less likely. 2. No acute traumatic abdominopelvic injury identified. 3. Mild hiatal hernia with distal esophageal dilatation, which may be due to reflux or dysmotility. Electronically signed by: Elsie Gravely MD 07/29/2024 08:46 PM EST RP Workstation: [id number]   CT L-SPINE NO CHARGE Result Date: 07/29/2024 EXAM: CT OF THE LUMBAR SPINE WITHOUT CONTRAST 07/29/2024 08:34:16 PM TECHNIQUE: CT of the lumbar spine was performed without the administration of intravenous contrast. Multiplanar reformatted images are provided for review. Colon images are reconstructed from a contemporaneous CT abdomen and pelvis. Automated exposure control, iterative reconstruction, and/or weight based adjustment of the mA/kV was utilized to reduce the radiation dose to as low as reasonably achievable. COMPARISON: CT lumbar spine 02/22/2023 and lumbar spine radiographs 03/02/2023. CLINICAL HISTORY: Low back pain and lower abdominal pain since the fall 1 week ago. FINDINGS: BONES AND ALIGNMENT: Normal alignment of the lumbar spine. Compression of the superior endplate of T12 appears unchanged since the prior study. Compression of the superior endplate of L3 demonstrates mild progression since prior study. Compression of L4 vertebra has progressed since prior study with about 80% loss of height centrally. This could indicate acute progression of compression fracture in the setting of trauma. No suspicious bone lesion. DEGENERATIVE CHANGES: Degenerative changes in the lumbar spine with narrowed interspaces and endplate osteophyte formation. Degenerative disc disease at multiple levels. Degenerative changes in the facet joints. 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 SOFT TISSUES: No abnormal paraspinal soft tissue mass or infiltration. Paraspinal muscles are symmetrical. See additional report of CT abdomen and pelvis for non lumbar spine findings. IMPRESSION: 1. Progression of L4 vertebral compression fracture with about 80% loss of height centrally, possibly acute in the setting of trauma. 2. Mild progression of L3 superior endplate compression since prior study. 3. Unchanged T12 superior endplate compression since prior study. 4. 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 Electronically signed by: Elsie Gravely MD 07/29/2024 08:40 PM EST RP Workstation: [id number]   DG Thoracic Spine 2 View Result Date: 07/29/2024 EXAM: 2 VIEW(S) XRAY OF THE THORACIC SPINE 07/29/2024 07:33:14 PM COMPARISON: CT examination of 01/27/2024. CLINICAL HISTORY: fall 1 week Fall one week ago. FINDINGS: BONES: Superior endplate fracture of T12 is noted, grossly stable since CT examination of 01/27/2024. No acute fracture or listhesis of the upper and mid thoracic spine. Alignment demonstrates exaggeration of the thoracic kyphosis. DISCS AND DEGENERATIVE CHANGES: Endplate remodeling and disc osteophyte  formation throughout the mid and lower thoracic spine is present in keeping with changes of moderate degenerative disc disease. SOFT TISSUES: The visualized lungs are clear. IMPRESSION: 1. No acute fracture or listhesis of the thoracic spine. 2. Chronic superior endplate fracture of T12. 3. Exaggerated thoracic kyphosis. Electronically signed by: Dorethia Molt MD 07/29/2024 08:08 PM EST RP Workstation: [id number]      IMPRESSION AND PLAN:  Assessment and Plan: * Sepsis due to gram-negative UTI [facility]) - The patient be admitted to a telemetry bed. - Will continue antibiotic therapy with IV Rocephin . - Will continue hydration with IV lactated ringer . - Will follow blood and urine  cultures. - Sepsis is manifested by tachycardia and tachypnea.  T12 compression fracture (HCC) - This could be acute on chronic compression fracture. - Pain management will be provided. - Neurosurgery consult to be obtained. - I notified Dr. Claudene about the patient  GERD without esophagitis - Will continue PPI therapy.  Dyslipidemia - Will continue statin therapy.  Essential hypertension Will continue antihypertensive therapy.   DVT prophylaxis: Lovenox .  Advanced Care Planning:  Code Status: full code.  Family Communication:  The plan of care was discussed in details with the patient (and family). I answered all questions. The patient agreed to proceed with the above mentioned plan. Further management will depend upon hospital course. Disposition Plan: Back to previous home environment Consults called: Neurosurgery. All the records are reviewed and case discussed with ED provider.  Status is: Inpatient  At the time of the admission, it appears that the appropriate admission status for this patient is inpatient.  This is judged to be reasonable and necessary in order to provide the required intensity of service to ensure the patient's safety given the presenting symptoms, physical exam findings and initial radiographic and laboratory data in the context of comorbid conditions.  The patient requires inpatient status due to high intensity of service, high risk of further deterioration and high frequency of surveillance required.  I certify that at the time of admission, it is my clinical judgment that the patient will require inpatient hospital care extending more than 2 midnights.                            Dispo: The patient is from: Home              Anticipated d/c is to: Home              Patient currently is not medically stable to d/c.              Difficult to place patient: No  [place] DELENA Peaches M.D on 07/30/2024 at 3:14 AM  [facility]   From 7 PM-7 AM, contact  night-coverage [email or web address]  CC: Primary care physician; Administration, [facility]     [1]  Allergies Allergen Reactions   Lisinopril Cough   "

## 2024-07-29 NOTE — ED Triage Notes (Signed)
 Pt to ED for lower back pain and lower abdominal pain since mechanical fall 1 week ago (wasn't seen after the fall). States the pain started on his lower back and then moved to the front about 4-5 days ago and is bilateral. States having urinary frequency also since the fall. Pt alert, oriented, NAD.

## 2024-07-29 NOTE — Progress Notes (Signed)
 PHARMACIST - PHYSICIAN COMMUNICATION  CONCERNING:  Enoxaparin  (Lovenox ) for DVT Prophylaxis    RECOMMENDATION: Patient was prescribed enoxaprin 40mg  q24 hours for VTE prophylaxis.   Filed Weights   07/29/24 1618  Weight: 97.5 kg (215 lb)    Body mass index is 29.99 kg/m.  Estimated Creatinine Clearance: 33.8 mL/min (A) (by C-G formula based on SCr of 1.94 mg/dL (H)).   Based on [facility] policy patient is candidate for enoxaparin  0.5mg /kg TBW SQ every 24 hours based on BMI being >30.  DESCRIPTION: Pharmacy has adjusted enoxaparin  dose per [facility] policy.  Patient is now receiving enoxaparin  0.5 mg/kg every 24 hours   [place] CANDIE Dills, PharmD, [facility] 07/29/2024 9:31 PM

## 2024-07-29 NOTE — ED Provider Notes (Signed)
 "  [facility] Provider Note    Event Date/Time   First MD Initiated Contact with Patient 07/29/24 1831     (approximate)   History   Back Pain, Fall, and Urinary Frequency   HPI  Peter Webster is a 84 y.o. male history of multiple myeloma and chronic kidney disease  Patient reports he was well.  He fell at his birthday about 8 days ago.  He was walking when he fell he reports Khade fell onto his buttock but since then he has been having pain in his lower to middle back.  It radiates up pain started towards his mid upper abdomen.  He did vomit a couple times a few days ago but that went away and he says that happens from time to time when he thinks he may have eaten some denigrating.  Today has been eating well drinking well without concern.  But he continues to have pain.  He missed his pain appointment with the pain clinic yesterday because when he gets up and tries to move about because a lot of discomfort in his back and radiates out towards the middle of his abdomen.  No fevers no chills did not strike his head.  He is otherwise feeling well no numbness or weakness in the legs.  He reports is like this pain in his back and sort radiates out   Past Medical History:  Diagnosis Date   Acute kidney failure    due to chemo   Allergy    Arthritis    Bladder cancer (HCC)    Chronic pain syndrome    DDD (degenerative disc disease), lumbar    ED (erectile dysfunction)    Elevated PSA    GERD (gastroesophageal reflux disease)    Headache    due to sinuses   Hernia of anterior abdominal wall    Hyperlipidemia    Hypertension    Multiple myeloma not having achieved remission (HCC)    Right inguinal hernia    Sleep apnea    uses CPAP   Thrombocytopenia    Wedge compression fracture of t11-T12 vertebra, sequela      Physical Exam   Triage Vital Signs: ED Triage Vitals  Encounter Vitals Group     BP 07/29/24 1616 120/74     Girls Systolic BP  Percentile --      Girls Diastolic BP Percentile --      Boys Systolic BP Percentile --      Boys Diastolic BP Percentile --      Pulse Rate 07/29/24 1614 (!) 111     Resp 07/29/24 1614 20     Temp 07/29/24 1614 97.7 F (36.5 C)     Temp Source 07/29/24 1614 Oral     SpO2 07/29/24 1614 95 %     Weight 07/29/24 1618 215 lb (97.5 kg)     Height 07/29/24 1618 5' 11 (1.803 m)     Head Circumference --      Peak Flow --      Pain Score 07/29/24 1615 8     Pain Loc --      Pain Education --      Exclude from Growth Chart --     Most recent vital signs: Vitals:   07/29/24 2130 07/29/24 2308  BP: 131/73   Pulse: 100   Resp: (!) 21 19  Temp: 98.2 F (36.8 C)   SpO2: 100%      General: Awake, no distress.  Normocephalic atraumatic accompanied by his wife both very pleasant CV:   Good peripheral perfusion. Normal rate and heart tones. Resp:   Normal effort. Lung sounds clear bilateral. Speaking without distress. Abd:   No distention. Soft, non-tender to palpation in all quadrants there is a moderate discomfort reproducible in the epigastrium only and along with that some of the left upper quadrant and left flank. No rebound or guarding.  There is no bruising or ecchymosis Neuro:   No focal neuro deficits noted. Moves extremities well without noted concern. Other:  When asked, patient reports urinating normally but has noticed his urine has been cloudy for about a week as well  When he goes to sit up he needs assistance because of pain in his mid back.  He is quite a bit of difficulty trying to sit up on his own due to pain in his upper lumbar region.  He localizes pain I had him logroll to the side there is no bruising or ecchymosis but he has focal tenderness in the upper lumbar region of the spine midline without step-off or deformity.  Moves legs without difficulty no sensory abnormalities no abnormal vascular findings lower extremity   ED Results / Procedures / Treatments    Labs (all labs ordered are listed, but only abnormal results are displayed) Labs Reviewed  LIPASE, BLOOD - Abnormal; Notable for the following components:      Result Value   Lipase 534 (*)    All other components within normal limits  COMPREHENSIVE METABOLIC PANEL WITH GFR - Abnormal; Notable for the following components:   CO2 21 (*)    Glucose, Bld 141 (*)    BUN 30 (*)    Creatinine, Ser 1.94 (*)    Calcium  10.5 (*)    AST 49 (*)    Alkaline Phosphatase 143 (*)    GFR, Estimated 34 (*)    All other components within normal limits  CBC - Abnormal; Notable for the following components:   RDW 17.2 (*)    Platelets 61 (*)    All other components within normal limits  URINALYSIS, ROUTINE W REFLEX MICROSCOPIC - Abnormal; Notable for the following components:   Color, Urine YELLOW (*)    APPearance TURBID (*)    Glucose, UA >=500 (*)    Hgb urine dipstick LARGE (*)    Protein, ur 100 (*)    Leukocytes,Ua LARGE (*)    Bacteria, UA MANY (*)    All other components within normal limits  URINE CULTURE  PROTIME-INR  CORTISOL-AM, BLOOD  BASIC METABOLIC PANEL WITH GFR  CBC   Labs demonstrate normal CBC with exception to thrombocytopenia, additionally elevated lipase minimally elevated AST.  Creatinine 2 approximating his last check about 2 years ago.  Urinalysis demonstrates leukocytes many bacteria findings concerning for possible UTI      CT ABDOMEN PELVIS WO CONTRAST Result Date: 07/29/2024 EXAM: CT ABDOMEN AND PELVIS WITHOUT CONTRAST 07/29/2024 08:34:16 PM TECHNIQUE: CT of the abdomen and pelvis was performed without the administration of intravenous contrast. Multiplanar reformatted images are provided for review. Automated exposure control, iterative reconstruction, and/or weight-based adjustment of the mA/kV was utilized to reduce the radiation dose to as low as reasonably achievable. COMPARISON: CT chest 01/27/2024. CLINICAL HISTORY: Abdominal trauma, blunt; back pain;  possible epigastric pain; possible urinary tract infection. Patient fell about 1 week ago. FINDINGS: LOWER CHEST: Ground glass opacities or atelectasis demonstrated in the lung bases. LIVER: The liver is unremarkable. GALLBLADDER AND BILE DUCTS:  Gallbladder is unremarkable. No biliary ductal dilatation. SPLEEN: No acute abnormality. PANCREAS: No acute abnormality. ADRENAL GLANDS: No acute abnormality. KIDNEYS, URETERS AND BLADDER: Bilateral renal cysts. The largest on the left measures 4.2 cm in diameter. Visualized kidneys are unchanged. No imaging follow-up is indicated. Per consensus, no follow-up is needed for simple Bosniak type 1 and 2 renal cysts, unless the patient has a malignancy history or risk factors. No hydronephrosis or hydroureter. No renal or ureteral stones. No perinephric or periureteral stranding. Diffuse bladder wall thickening with stranding in the pelvic fat and gas in the bladder. This is likely to indicate cystitis with a gas-forming organism. Gas could also have been instilled into the bladder by catheterization if this procedure has been performed. Less likely, this would be due to a fistula. GI AND BOWEL: Mild esophageal hiatal hernia. Dilated distal esophagus may be due to reflux or dysmotility. Stomach demonstrates no acute abnormality. Small bowel and colon are not abnormally distended. No inflammatory infiltration around the bowel. There is no bowel obstruction. PERITONEUM AND RETROPERITONEUM: No ascites. No free air. No free fluid in the abdomen. VASCULATURE: Calcification of the aorta without aneurysm. Aorta is normal in caliber. LYMPH NODES: No significant lymphadenopathy. REPRODUCTIVE ORGANS: Prostate gland is not enlarged. BONES AND SOFT TISSUES: Postoperative changes consistent with mesh hernia repair in the low pelvis. Small periumbilical hernia containing fat. Degenerative changes in the hips. No acute osseous abnormality. No focal soft tissue abnormality. See additional  report of CT lumbar spine. IMPRESSION: 1. Diffuse bladder wall thickening with perivesical stranding and intravesical gas, most consistent with cystitis due to a gas-forming organism, with catheter-related intravesical gas as an alternative consideration and fistula less likely. 2. No acute traumatic abdominopelvic injury identified. 3. Mild hiatal hernia with distal esophageal dilatation, which may be due to reflux or dysmotility. Electronically signed by: Elsie Gravely MD 07/29/2024 08:46 PM EST RP Workstation: [id number]   CT L-SPINE NO CHARGE Result Date: 07/29/2024 EXAM: CT OF THE LUMBAR SPINE WITHOUT CONTRAST 07/29/2024 08:34:16 PM TECHNIQUE: CT of the lumbar spine was performed without the administration of intravenous contrast. Multiplanar reformatted images are provided for review. Colon images are reconstructed from a contemporaneous CT abdomen and pelvis. Automated exposure control, iterative reconstruction, and/or weight based adjustment of the mA/kV was utilized to reduce the radiation dose to as low as reasonably achievable. COMPARISON: CT lumbar spine 02/22/2023 and lumbar spine radiographs 03/02/2023. CLINICAL HISTORY: Low back pain and lower abdominal pain since the fall 1 week ago. FINDINGS: BONES AND ALIGNMENT: Normal alignment of the lumbar spine. Compression of the superior endplate of T12 appears unchanged since the prior study. Compression of the superior endplate of L3 demonstrates mild progression since prior study. Compression of L4 vertebra has progressed since prior study with about 80% loss of height centrally. This could indicate acute progression of compression fracture in the setting of trauma. No suspicious bone lesion. DEGENERATIVE CHANGES: Degenerative changes in the lumbar spine with narrowed interspaces and endplate osteophyte formation. Degenerative disc disease at multiple levels. Degenerative changes in the facet joints. The spinal canal appears congenitally small with  evidence of mild to moderate central stenosis at L1-L2, L2-L3, L3-L4, L4-L5, and L5-S1 caused by likely combination of diffuse bulging disc annulus with posterior ligamentous and facet joint hypertrophy. SOFT TISSUES: No abnormal paraspinal soft tissue mass or infiltration. Paraspinal muscles are symmetrical. See additional report of CT abdomen and pelvis for non lumbar spine findings. IMPRESSION: 1. Progression of L4 vertebral compression fracture with about 80%  loss of height centrally, possibly acute in the setting of trauma. 2. Mild progression of L3 superior endplate compression since prior study. 3. Unchanged T12 superior endplate compression since prior study. 4. 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 Electronically signed by: Elsie Gravely MD 07/29/2024 08:40 PM EST RP Workstation: [id number]   DG Thoracic Spine 2 View Result Date: 07/29/2024 EXAM: 2 VIEW(S) XRAY OF THE THORACIC SPINE 07/29/2024 07:33:14 PM COMPARISON: CT examination of 01/27/2024. CLINICAL HISTORY: fall 1 week Fall one week ago. FINDINGS: BONES: Superior endplate fracture of T12 is noted, grossly stable since CT examination of 01/27/2024. No acute fracture or listhesis of the upper and mid thoracic spine. Alignment demonstrates exaggeration of the thoracic kyphosis. DISCS AND DEGENERATIVE CHANGES: Endplate remodeling and disc osteophyte formation throughout the mid and lower thoracic spine is present in keeping with changes of moderate degenerative disc disease. SOFT TISSUES: The visualized lungs are clear. IMPRESSION: 1. No acute fracture or listhesis of the thoracic spine. 2. Chronic superior endplate fracture of T12. 3. Exaggerated thoracic kyphosis. Electronically signed by: Dorethia Molt MD 07/29/2024 08:08 PM EST RP Workstation: [id number]       PROCEDURES:  Critical Care performed: No  Procedures   MEDICATIONS ORDERED IN ED: Medications  amLODipine   (NORVASC ) tablet 5 mg (has no administration in time range)  losartan  (COZAAR ) tablet 25 mg (has no administration in time range)  rosuvastatin  (CRESTOR ) tablet 20 mg (has no administration in time range)  FLUoxetine  (PROZAC ) capsule 20 mg (has no administration in time range)  famotidine  (PEPCID ) tablet 20 mg (has no administration in time range)  pantoprazole  (PROTONIX ) EC tablet 40 mg (has no administration in time range)  tiZANidine  (ZANAFLEX ) tablet 4 mg (has no administration in time range)  calcium -vitamin D  (OSCAL WITH D) 500-5 MG-MCG per tablet 2 tablet (has no administration in time range)  gabapentin  (NEURONTIN ) capsule 300 mg (300 mg Oral Given 07/29/24 2251)  loratadine  (CLARITIN ) tablet 10 mg (has no administration in time range)  ammonium lactate  (LAC-HYDRIN ) 12 % lotion 1 Application (has no administration in time range)  lactated ringers  infusion (150 mL/hr Intravenous New Bag/Given 07/29/24 2258)  enoxaparin  (LOVENOX ) injection 50 mg (has no administration in time range)  cefTRIAXone  (ROCEPHIN ) 1 g in sodium chloride  0.9 % 100 mL IVPB (has no administration in time range)  acetaminophen  (TYLENOL ) tablet 650 mg (650 mg Oral Given 07/29/24 2317)    Or  acetaminophen  (TYLENOL ) suppository 650 mg ( Rectal See Alternative 07/29/24 2317)  traZODone  (DESYREL ) tablet 25 mg (has no administration in time range)  magnesium  hydroxide (MILK OF MAGNESIA) suspension 30 mL (has no administration in time range)  ondansetron  (ZOFRAN ) tablet 4 mg (has no administration in time range)    Or  ondansetron  (ZOFRAN ) injection 4 mg (has no administration in time range)  morphine  (PF) 2 MG/ML injection 2 mg (has no administration in time range)  cefTRIAXone  (ROCEPHIN ) 1 g in sodium chloride  0.9 % 100 mL IVPB (0 g Intravenous Stopped 07/29/24 2033)  oxyCODONE -acetaminophen  (PERCOCET/ROXICET) 5-325 MG per tablet 1 tablet (1 tablet Oral Given 07/29/24 1953)     IMPRESSION / MDM / ASSESSMENT AND PLAN / ED  COURSE  I reviewed the triage vital signs and the nursing notes.                              Broad differential clues etiology such as acute associate  intra-abdominal pain, urinary infectious symptoms especially given the history provides, sepsis in the setting of elevated heart rate and respiratory rate, as well as other causes such as musculoskeletal back pain compression fracture vertebral injury etc. as he reports a fall onto his buttock exacerbating pain and discomfort.  He did not strike his head normocephalic atraumatic no neurologic or vascular findings  Imaging demonstrates compression fracture.  Pain improving with treatment.  Discussed with patient, and feel that this is likely driving, significant component of his back pain that he has been experiencing since his birthday.  However additionally has evidence of a UTI, and emphysematous pyelonephritis strongly considered.  He does meet sepsis criteria.  Will start on Rocephin .  Given the patient has evidence of UTI, sepsis, will admit to hospitalist.  Also this will be a good opportunity to manage his compression fracture pain control further physical therapy, neurosurgical/spine consult as needed etc.  Patient is wife understand agreeable plan for admission.  Pain is improved after treatment.   Patient's presentation is most consistent with acute complicated illness / injury requiring diagnostic workup.    The patient is on the cardiac monitor to evaluate for evidence of arrhythmia and/or significant heart rate changes.     FINAL CLINICAL IMPRESSION(S) / ED DIAGNOSES   Final diagnoses:  Urinary tract infection, acute  Sepsis, due to unspecified organism, unspecified whether acute organ dysfunction present (HCC)  Closed compression fracture of L4 lumbar vertebra, initial encounter (HCC)     Rx / DC Orders   ED Discharge Orders     None        Note:  This document was prepared using Dragon voice recognition software  and may include unintentional dictation errors.   Dicky Anes, MD 07/30/24 0006  "

## 2024-07-30 DIAGNOSIS — E785 Hyperlipidemia, unspecified: Secondary | ICD-10-CM | POA: Insufficient documentation

## 2024-07-30 DIAGNOSIS — N390 Urinary tract infection, site not specified: Secondary | ICD-10-CM | POA: Diagnosis not present

## 2024-07-30 DIAGNOSIS — K219 Gastro-esophageal reflux disease without esophagitis: Secondary | ICD-10-CM

## 2024-07-30 DIAGNOSIS — A4150 Gram-negative sepsis, unspecified: Secondary | ICD-10-CM | POA: Diagnosis not present

## 2024-07-30 DIAGNOSIS — S22080A Wedge compression fracture of T11-T12 vertebra, initial encounter for closed fracture: Secondary | ICD-10-CM

## 2024-07-30 DIAGNOSIS — I10 Essential (primary) hypertension: Secondary | ICD-10-CM | POA: Insufficient documentation

## 2024-07-30 LAB — PROTIME-INR
INR: 1.2 (ref 0.8–1.2)
Prothrombin Time: 15.7 s — ABNORMAL HIGH (ref 11.4–15.2)

## 2024-07-30 LAB — DIFFERENTIAL
Abs Immature Granulocytes: 0.02 10*3/uL (ref 0.00–0.07)
Basophils Absolute: 0 10*3/uL (ref 0.0–0.1)
Basophils Relative: 1 %
Eosinophils Absolute: 0.1 10*3/uL (ref 0.0–0.5)
Eosinophils Relative: 1 %
Immature Granulocytes: 0 %
Lymphocytes Relative: 26 %
Lymphs Abs: 1.4 10*3/uL (ref 0.7–4.0)
Monocytes Absolute: 0.8 10*3/uL (ref 0.1–1.0)
Monocytes Relative: 14 %
Neutro Abs: 3.1 10*3/uL (ref 1.7–7.7)
Neutrophils Relative %: 58 %

## 2024-07-30 LAB — BASIC METABOLIC PANEL WITH GFR
Anion gap: 9 (ref 5–15)
BUN: 26 mg/dL — ABNORMAL HIGH (ref 8–23)
CO2: 22 mmol/L (ref 22–32)
Calcium: 9.8 mg/dL (ref 8.9–10.3)
Chloride: 107 mmol/L (ref 98–111)
Creatinine, Ser: 1.8 mg/dL — ABNORMAL HIGH (ref 0.61–1.24)
GFR, Estimated: 37 mL/min — ABNORMAL LOW
Glucose, Bld: 107 mg/dL — ABNORMAL HIGH (ref 70–99)
Potassium: 4.6 mmol/L (ref 3.5–5.1)
Sodium: 138 mmol/L (ref 135–145)

## 2024-07-30 LAB — CBC
HCT: 37.8 % — ABNORMAL LOW (ref 39.0–52.0)
Hemoglobin: 12.6 g/dL — ABNORMAL LOW (ref 13.0–17.0)
MCH: 32.5 pg (ref 26.0–34.0)
MCHC: 33.3 g/dL (ref 30.0–36.0)
MCV: 97.4 fL (ref 80.0–100.0)
Platelets: 53 10*3/uL — ABNORMAL LOW (ref 150–400)
RBC: 3.88 MIL/uL — ABNORMAL LOW (ref 4.22–5.81)
RDW: 17.1 % — ABNORMAL HIGH (ref 11.5–15.5)
WBC: 5.3 10*3/uL (ref 4.0–10.5)
nRBC: 0 % (ref 0.0–0.2)

## 2024-07-30 LAB — D-DIMER, QUANTITATIVE: D-Dimer, Quant: 2.62 ug{FEU}/mL — ABNORMAL HIGH (ref 0.00–0.50)

## 2024-07-30 LAB — FIBRINOGEN: Fibrinogen: 462 mg/dL (ref 210–475)

## 2024-07-30 LAB — CORTISOL-AM, BLOOD: Cortisol - AM: 13.8 ug/dL (ref 6.7–22.6)

## 2024-07-30 MED ORDER — EMPAGLIFLOZIN 10 MG PO TABS
10.0000 mg | ORAL_TABLET | Freq: Every day | ORAL | Status: DC
  Administered 2024-07-30 – 2024-08-01 (×3): 10 mg via ORAL
  Filled 2024-07-30 (×3): qty 1

## 2024-07-30 MED ORDER — TRAMADOL HCL 50 MG PO TABS
50.0000 mg | ORAL_TABLET | Freq: Two times a day (BID) | ORAL | Status: DC | PRN
  Administered 2024-07-30 – 2024-08-01 (×4): 50 mg via ORAL
  Filled 2024-07-30 (×4): qty 1

## 2024-07-30 MED ORDER — POLYETHYLENE GLYCOL 3350 17 G PO PACK: 17.0000 g | PACK | Freq: Every day | ORAL | Status: DC | PRN

## 2024-07-30 MED ORDER — SENNA 8.6 MG PO TABS
1.0000 | ORAL_TABLET | Freq: Every day | ORAL | Status: DC
  Administered 2024-07-30 – 2024-08-01 (×3): 8.6 mg via ORAL
  Filled 2024-07-30 (×3): qty 1

## 2024-07-30 MED ORDER — CLOBETASOL PROPIONATE 0.05 % EX OINT: 1.0000 | TOPICAL_OINTMENT | Freq: Every day | CUTANEOUS | Status: DC | PRN

## 2024-07-30 MED ORDER — HYDROCORTISONE 1 % EX OINT: 1.0000 | TOPICAL_OINTMENT | Freq: Every day | CUTANEOUS | Status: DC | PRN

## 2024-07-30 MED ORDER — OXYCODONE HCL 5 MG PO TABS
2.5000 mg | ORAL_TABLET | ORAL | Status: DC | PRN
  Administered 2024-07-30 – 2024-07-31 (×3): 2.5 mg via ORAL
  Filled 2024-07-30 (×3): qty 1

## 2024-07-30 NOTE — Evaluation (Signed)
 Occupational Therapy Evaluation Patient Details Name: Peter Webster MRN: [id number] DOB: [birth year] Today's Date: 07/30/2024   History of Present Illness   84 y.o. Caucasian male with medical history significant for osteoarthritis, GERD, DDD, chronic pain syndrome, hypertension, dyslipidemia, multiple myeloma, and OSA on CPAP, who presented to the emergency room with acute onset of mechanical fall. Had Neurosurgery consult who states: some worsening of his L3 and L4 compression fractures noted and a newer T12 compression fracture noted. These do not appear to be unstable fractures and can be treated nonoperatively and without bracing, okay for pain control regimen.     Clinical Impressions Upon entering patient supine in bed.  Wife in chair.  Patient report had a couple of falls with the first one 15 January.  1 with visit house and 1 at the restaurant.  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  Since the last fall patient with increased pain at T12 or upper thoracic back.  On the left.  Increased pain with left upper extremity resisted activities like pushing and pulling and lifting.  Patient independent in basic ADLs including toilet transfers and clothing management as well as lower body dressing.  Able to sit upright and cross legs over for footwear.  Patient was able to ambulate with rolling walker to bathroom and independent in transfers, mobility and clothing management and hygiene.  No loss of balance.  Prior to fall patient started outpatient physical therapy that was ordered by the [place].  Discussed with patient CARE exercise program for her cancer patients that he can look into after he is finished with physical therapy.  Patient can benefit from skilled OT services in the setting educating some back protection principles in ADLs and IADLs as well as some upper extremity strengthening and range of motion.     If plan is discharge home, recommend the following:     Outpatient versus  home health    Functional Status Assessment         Equipment Recommendations         Recommendations for Other Services         Precautions/Restrictions   Precautions Precautions: Fall Restrictions Weight Bearing Restrictions Per Provider Order: No     Mobility Bed Mobility Overal bed mobility: Modified Independent             General bed mobility comments: Supine to sit and sit to supine independent with education for logrolling    Transfers   Equipment used: Rolling walker (2 wheels)               General transfer comment: Independent with supervision for sit to stand as well as ambulating to bathroom.  No loss of balance using rolling walker      Balance Overall balance assessment: Modified Independent                                         ADL either performed or assessed with clinical judgement   ADL                                         General ADL Comments: Patient independent in basic ADLs including feeding and grooming as well as upper body and lower body dressing and bathing.  Patient independent in toileting and toilet transfers.  For lower body dressing patient crosses leg over able to keep back straight or perform shoe wear     Vision Baseline Vision/History: 1 Wears glasses Patient Visual Report: No change from baseline       Perception         Praxis         Pertinent Vitals/Pain Pain Assessment Pain Assessment: 0-10 Pain Score: 6  Pain Location: Left thoracic pain Pain Intervention(s): Monitored during session, Premedicated before session     Extremity/Trunk Assessment Upper Extremity Assessment Upper Extremity Assessment: Overall WFL for tasks assessed;Left hand dominant (Left thoracic pain with any resistance to left upper extremity.  Pain with pulling and pushing and reaching of left hand since fall)           Communication Communication Communication: No apparent  difficulties   Cognition Arousal: Alert Behavior During Therapy: WFL for tasks assessed/performed Cognition: No apparent impairments                               Following commands: Intact       Cueing  General Comments   Cueing Techniques: Verbal cues  Patient able to do toilet hygiene and clothing management without holding onto walker.  In standing.  Can participate in sitting and lower body ADLs with no loss of balance   Exercises     Shoulder Instructions      Home Living Family/patient expects to be discharged to:: Private residence Living Arrangements: Spouse/significant other Available Help at Discharge: Family Type of Home: House             Bathroom Shower/Tub: Tub/shower unit (But do have a railing and shower chair as well as a hand-held shower and fix shower)   Bathroom Toilet: Handicapped height     Home Equipment: Agricultural Consultant (2 wheels);Hand held shower head;Lift chair;Shower seat          Prior Functioning/Environment Prior Level of Function : Independent/Modified Independent               ADLs Comments: Patient already using a 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  Patient had 2 falls in the last 6 months.  Patient lately not driving but wife.  But independent in basic ADLs as well as some homemaking activities like laundry and cooking.    OT Problem List: Decreased strength;Decreased activity tolerance;Impaired UE functional use;Pain   OT Treatment/Interventions: Self-care/ADL training;Therapeutic exercise;Patient/family education (Back protection modifications)      OT Goals(Current goals can be found in the care plan section)   Acute Rehab OT Goals Patient Stated Goal: To go home and I am scheduled for some outpatient physical therapy OT Goal Formulation: With patient/family Time For Goal Achievement: 08/12/24 Potential to Achieve Goals: Good   OT Frequency:  Min 1X/week    Co-evaluation  PT/OT/SLP Co-Evaluation/Treatment: Yes Reason for Co-Treatment: For patient/therapist safety;To address functional/ADL transfers PT goals addressed during session: Mobility/safety with mobility;Balance;Proper use of DME OT goals addressed during session: ADL's and self-care;Strengthening/ROM      AM-PAC OT 6 Clicks Daily Activity     Outcome Measure Help from another person eating meals?: None Help from another person taking care of personal grooming?: None Help from another person toileting, which includes using toliet, bedpan, or urinal?: None Help from another person bathing (including washing, rinsing, drying)?: A Little Help from another person to put on and taking off regular upper body clothing?: None Help from  another person to put on and taking off regular lower body clothing?: A Little 6 Click Score: 22   End of Session Equipment Utilized During Treatment: Rolling walker (2 wheels)  Activity Tolerance: Patient tolerated treatment well Patient left: in chair;with family/visitor present  OT Visit Diagnosis: Repeated falls (R29.6);Muscle weakness (generalized) (M62.81);Pain Pain - Right/Left: Left Pain - part of body:  (Upper back/thoracic)                Time: 8598-8567 OT Time Calculation (min): 31 min Charges:  OT General Charges $OT Visit: 1 Visit OT Evaluation $OT Eval Low Complexity: 1 Low    Symphoni Helbling OTR/L,CLT 07/30/2024, 3:03 PM

## 2024-07-30 NOTE — Assessment & Plan Note (Signed)
-   Will continue PPI therapy.

## 2024-07-30 NOTE — ED Provider Notes (Incomplete)
 "  [facility] Provider Note    Event Date/Time   First MD Initiated Contact with Patient 07/29/24 1831     (approximate)   History   Back Pain, Fall, and Urinary Frequency   HPI  Peter Webster is a 84 y.o. male history of multiple myeloma and chronic kidney disease  Patient reports he was well.  He fell at his birthday about 8 days ago.  He was walking when he fell he reports Khade fell onto his buttock but since then he has been having pain in his lower to middle back.  It radiates up pain started towards his mid upper abdomen.  He did vomit a couple times a few days ago but that went away and he says that happens from time to time when he thinks he may have eaten some denigrating.  Today has been eating well drinking well without concern.  But he continues to have pain.  He missed his pain appointment with the pain clinic yesterday because when he gets up and tries to move about because a lot of discomfort in his back and radiates out towards the middle of his abdomen.  No fevers no chills did not strike his head.  He is otherwise feeling well no numbness or weakness in the legs.  He reports is like this pain in his back and sort radiates out   Past Medical History:  Diagnosis Date   Acute kidney failure    due to chemo   Allergy    Arthritis    Bladder cancer (HCC)    Chronic pain syndrome    DDD (degenerative disc disease), lumbar    ED (erectile dysfunction)    Elevated PSA    GERD (gastroesophageal reflux disease)    Headache    due to sinuses   Hernia of anterior abdominal wall    Hyperlipidemia    Hypertension    Multiple myeloma not having achieved remission (HCC)    Right inguinal hernia    Sleep apnea    uses CPAP   Thrombocytopenia    Wedge compression fracture of t11-T12 vertebra, sequela      Physical Exam   Triage Vital Signs: ED Triage Vitals  Encounter Vitals Group     BP 07/29/24 1616 120/74     Girls  Systolic BP Percentile --      Girls Diastolic BP Percentile --      Boys Systolic BP Percentile --      Boys Diastolic BP Percentile --      Pulse Rate 07/29/24 1614 (!) 111     Resp 07/29/24 1614 20     Temp 07/29/24 1614 97.7 F (36.5 C)     Temp Source 07/29/24 1614 Oral     SpO2 07/29/24 1614 95 %     Weight 07/29/24 1618 215 lb (97.5 kg)     Height 07/29/24 1618 5' 11 (1.803 m)     Head Circumference --      Peak Flow --      Pain Score 07/29/24 1615 8     Pain Loc --      Pain Education --      Exclude from Growth Chart --     Most recent vital signs: Vitals:   07/29/24 2130 07/29/24 2308  BP: 131/73   Pulse: 100   Resp: (!) 21 19  Temp: 98.2 F (36.8 C)   SpO2: 100%      General: Awake, no distress.  Normocephalic atraumatic accompanied by his wife both very pleasant CV:   Good peripheral perfusion. Normal rate and heart tones. Resp:   Normal effort. Lung sounds clear bilateral. Speaking without distress. Abd:   No distention. Soft, non-tender to palpation in all quadrants there is a moderate discomfort reproducible in the epigastrium only and along with that some of the left upper quadrant and left flank. No rebound or guarding.  There is no bruising or ecchymosis Neuro:   No focal neuro deficits noted. Moves extremities well without noted concern. Other:  When asked, patient reports urinating normally but has noticed his urine has been cloudy for about a week as well  When he goes to sit up he needs assistance because of pain in his mid back.  He is quite a bit of difficulty trying to sit up on his own due to pain in his upper lumbar region.  He localizes pain I had him logroll to the side there is no bruising or ecchymosis but he has focal tenderness in the upper lumbar region of the spine midline without step-off or deformity.  Moves legs without difficulty no sensory abnormalities no abnormal vascular findings lower extremity   ED Results / Procedures /  Treatments   Labs (all labs ordered are listed, but only abnormal results are displayed) Labs Reviewed  LIPASE, BLOOD - Abnormal; Notable for the following components:      Result Value   Lipase 534 (*)    All other components within normal limits  COMPREHENSIVE METABOLIC PANEL WITH GFR - Abnormal; Notable for the following components:   CO2 21 (*)    Glucose, Bld 141 (*)    BUN 30 (*)    Creatinine, Ser 1.94 (*)    Calcium  10.5 (*)    AST 49 (*)    Alkaline Phosphatase 143 (*)    GFR, Estimated 34 (*)    All other components within normal limits  CBC - Abnormal; Notable for the following components:   RDW 17.2 (*)    Platelets 61 (*)    All other components within normal limits  URINALYSIS, ROUTINE W REFLEX MICROSCOPIC - Abnormal; Notable for the following components:   Color, Urine YELLOW (*)    APPearance TURBID (*)    Glucose, UA >=500 (*)    Hgb urine dipstick LARGE (*)    Protein, ur 100 (*)    Leukocytes,Ua LARGE (*)    Bacteria, UA MANY (*)    All other components within normal limits  URINE CULTURE  PROTIME-INR  CORTISOL-AM, BLOOD  BASIC METABOLIC PANEL WITH GFR  CBC   Labs demonstrate normal CBC with exception to thrombocytopenia, additionally elevated lipase minimally elevated AST.  Creatinine 2 approximating his last check about 2 years ago.  Urinalysis demonstrates leukocytes many bacteria findings concerning for possible UTI   EKG  I independently reviewed the EKG    RADIOLOGY I independently reviewed images of ***    PROCEDURES:  Critical Care performed: {CriticalCareYesNo:19197::Yes, see critical care procedure note(s),No}  Procedures   MEDICATIONS ORDERED IN ED: Medications  amLODipine  (NORVASC ) tablet 5 mg (has no administration in time range)  losartan  (COZAAR ) tablet 25 mg (has no administration in time range)  rosuvastatin  (CRESTOR ) tablet 20 mg (has no administration in time range)  FLUoxetine  (PROZAC ) capsule 20 mg (has no  administration in time range)  famotidine  (PEPCID ) tablet 20 mg (has no administration in time range)  pantoprazole  (PROTONIX ) EC tablet 40 mg (has no administration in time range)  tiZANidine  (ZANAFLEX ) tablet 4 mg (has no administration in time range)  calcium -vitamin D  (OSCAL WITH D) 500-5 MG-MCG per tablet 2 tablet (has no administration in time range)  gabapentin  (NEURONTIN ) capsule 300 mg (300 mg Oral Given 07/29/24 2251)  loratadine  (CLARITIN ) tablet 10 mg (has no administration in time range)  ammonium lactate  (LAC-HYDRIN ) 12 % lotion 1 Application (has no administration in time range)  lactated ringers  infusion (150 mL/hr Intravenous New Bag/Given 07/29/24 2258)  enoxaparin  (LOVENOX ) injection 50 mg (has no administration in time range)  cefTRIAXone  (ROCEPHIN ) 1 g in sodium chloride  0.9 % 100 mL IVPB (has no administration in time range)  acetaminophen  (TYLENOL ) tablet 650 mg (650 mg Oral Given 07/29/24 2317)    Or  acetaminophen  (TYLENOL ) suppository 650 mg ( Rectal See Alternative 07/29/24 2317)  traZODone  (DESYREL ) tablet 25 mg (has no administration in time range)  magnesium  hydroxide (MILK OF MAGNESIA) suspension 30 mL (has no administration in time range)  ondansetron  (ZOFRAN ) tablet 4 mg (has no administration in time range)    Or  ondansetron  (ZOFRAN ) injection 4 mg (has no administration in time range)  morphine  (PF) 2 MG/ML injection 2 mg (has no administration in time range)  cefTRIAXone  (ROCEPHIN ) 1 g in sodium chloride  0.9 % 100 mL IVPB (0 g Intravenous Stopped 07/29/24 2033)  oxyCODONE -acetaminophen  (PERCOCET/ROXICET) 5-325 MG per tablet 1 tablet (1 tablet Oral Given 07/29/24 1953)     IMPRESSION / MDM / ASSESSMENT AND PLAN / ED COURSE  I reviewed the triage vital signs and the nursing notes.                              Based on presentation, the differential diagnosis includes, but is not limited to key considerations: {ddxmaster:34367}  Patient's presentation is  most consistent with {EM COPA:27473}  ***  ***The patient is on the cardiac monitor to evaluate for evidence of arrhythmia and/or significant heart rate changes.      FINAL CLINICAL IMPRESSION(S) / ED DIAGNOSES   Final diagnoses:  Urinary tract infection, acute  Sepsis, due to unspecified organism, unspecified whether acute organ dysfunction present (HCC)  Closed compression fracture of L4 lumbar vertebra, initial encounter (HCC)   Uti ***  Rx / DC Orders   ED Discharge Orders     None        Note:  This document was prepared using Dragon voice recognition software and may include unintentional dictation errors. "

## 2024-07-30 NOTE — Plan of Care (Signed)
 m

## 2024-07-30 NOTE — Progress Notes (Signed)
 OT Cancellation Note  Patient Details Name: Peter Webster MRN: [id number] DOB: [birth year]   Cancelled Treatment:    Reason Eval/Treat Not Completed: Patient not medically ready  Medical issues which prohibited therapy - Patient has T12 fx and is pending neurosurgery consult.  Will evaluate after that.   [name] OTR/L,CLT 07/30/2024, 9:37 AM

## 2024-07-30 NOTE — Assessment & Plan Note (Signed)
-   Will continue antihypertensive therapy.

## 2024-07-30 NOTE — Progress Notes (Signed)
 PT Cancellation Note  Patient Details Name: Peter Webster MRN: [id number] DOB: [birth year]   Cancelled Treatment:    Reason Eval/Treat Not Completed: Medical issues which prohibited therapy Patient has T12 fx and is pending neurosurgery consult. Will evaluate after that.   [name] 07/30/2024, 8:36 AM

## 2024-07-30 NOTE — Plan of Care (Signed)

## 2024-07-30 NOTE — Evaluation (Signed)
 Physical Therapy Evaluation Patient Details Name: Peter Webster MRN: [id number] DOB: May 05, 1941 Today's Date: 07/30/2024  History of Present Illness  84 y.o. Caucasian male with medical history significant for osteoarthritis, GERD, DDD, chronic pain syndrome, hypertension, dyslipidemia, multiple myeloma, and OSA on CPAP, who presented to the emergency room with acute onset of mechanical fall. Had Neurosurgery consult who states: some worsening of his L3 and L4 compression fractures noted and a newer T12 compression fracture noted. These do not appear to be unstable fractures and can be treated nonoperatively and without bracing, okay for pain control regimen.   Clinical Impression  Patient received in bed, wife at bedside. He is pleasant and agrees to PT/OT assessment. Patient reports back pain since last fall 07/21/24. He requires cues and demo for log rolling supine to sit, Min A. Patient is able to stand from elevated bed with cga and cues for hand placement. He ambulated 120 feet with RW and cues for close proximity to walker and upright posture. Patient will continue to benefit from skilled PT to improve safety and independence.          If plan is discharge home, recommend the following: A little help with walking and/or transfers;Help with stairs or ramp for entrance;Assist for transportation   Can travel by private vehicle    yes    Equipment Recommendations None recommended by PT  Recommendations for Other Services       Functional Status Assessment Patient has had a recent decline in their functional status and demonstrates the ability to make significant improvements in function in a reasonable and predictable amount of time.     Precautions / Restrictions Precautions Precautions: Fall Recall of Precautions/Restrictions: Intact Restrictions Weight Bearing Restrictions Per Provider Order: No      Mobility  Bed Mobility Overal bed mobility: Needs Assistance Bed Mobility:  Rolling, Sidelying to Sit Rolling: Min assist Sidelying to sit: Min assist       General bed mobility comments: Supine to sit with min A. Demonstrated log rolling for patient with his verbal understanding and agreement.    Transfers Overall transfer level: Needs assistance Equipment used: Rolling walker (2 wheels) Transfers: Sit to/from Stand Sit to Stand: From elevated surface, Contact guard assist           General transfer comment: Cga for STS from elevated bed.    Ambulation/Gait Ambulation/Gait assistance: Supervision Gait Distance (Feet): 120 Feet Assistive device: Rolling walker (2 wheels) Gait Pattern/deviations: Step-through pattern Gait velocity: decr     General Gait Details: cues needed for closer proximity to RW.  Stairs            Wheelchair Mobility     Tilt Bed    Modified Rankin (Stroke Patients Only)       Balance Overall balance assessment: Needs assistance Sitting-balance support: Feet supported Sitting balance-Leahy Scale: Good     Standing balance support: Bilateral upper extremity supported, Reliant on assistive device for balance, During functional activity Standing balance-Leahy Scale: Good                               Pertinent Vitals/Pain Pain Assessment Pain Assessment: 0-10 Pain Score: 5  Pain Location: Left thoracic pain Pain Descriptors / Indicators: Discomfort, Sore, Grimacing, Guarding Pain Intervention(s): Monitored during session, Repositioned    Home Living Family/patient expects to be discharged to:: Private residence Living Arrangements: Spouse/significant other Available Help at Discharge: Family Type of Home:  House Home Access: Stairs to enter Entrance Stairs-Rails: Right;Left;Can reach both     Home Layout: One level Home Equipment: Agricultural Consultant (2 wheels);Hand held shower head;Lift chair;Shower seat      Prior Function Prior Level of Function : Independent/Modified  Independent;History of Falls (last six months)             Mobility Comments: Using walker for safety, falls ADLs Comments: Patient already using a 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  Patient had 2 falls in the last 6 months.  Patient lately not driving but wife.  But independent in basic ADLs as well as some homemaking activities like laundry and cooking.     Extremity/Trunk Assessment   Upper Extremity Assessment Upper Extremity Assessment: Defer to OT evaluation    Lower Extremity Assessment Lower Extremity Assessment: Generalized weakness;LLE deficits/detail;RLE deficits/detail RLE Deficits / Details: peripheral Neuropathy B RLE Sensation: decreased light touch;decreased proprioception LLE Sensation: decreased light touch;decreased proprioception    Cervical / Trunk Assessment Cervical / Trunk Assessment: Normal  Communication   Communication Communication: No apparent difficulties    Cognition Arousal: Alert Behavior During Therapy: WFL for tasks assessed/performed   PT - Cognitive impairments: No apparent impairments                         Following commands: Intact       Cueing Cueing Techniques: Verbal cues     General Comments General comments (skin integrity, edema, etc.): Patient able to do toilet hygiene and clothing management without holding onto walker.  In standing.  Can participate in sitting and lower body ADLs with no loss of balance    Exercises     Assessment/Plan    PT Assessment Patient needs continued PT services  PT Problem List Decreased activity tolerance;Decreased balance;Decreased mobility;Pain;Decreased knowledge of use of DME;Decreased knowledge of precautions;Decreased safety awareness;Impaired sensation       PT Treatment Interventions DME instruction;Gait training;Stair training;Functional mobility training;Therapeutic activities;Therapeutic exercise;Patient/family education    PT Goals (Current  goals can be found in the Care Plan section)  Acute Rehab PT Goals Patient Stated Goal: return home PT Goal Formulation: With patient/family Time For Goal Achievement: 08/06/24 Potential to Achieve Goals: Good    Frequency Min 2X/week     Co-evaluation PT/OT/SLP Co-Evaluation/Treatment: Yes Reason for Co-Treatment: For patient/therapist safety;To address functional/ADL transfers PT goals addressed during session: Mobility/safety with mobility;Balance;Proper use of DME OT goals addressed during session: ADL's and self-care;Strengthening/ROM       AM-PAC PT 6 Clicks Mobility  Outcome Measure Help needed turning from your back to your side while in a flat bed without using bedrails?: A Little Help needed moving from lying on your back to sitting on the side of a flat bed without using bedrails?: A Little Help needed moving to and from a bed to a chair (including a wheelchair)?: A Little Help needed standing up from a chair using your arms (e.g., wheelchair or bedside chair)?: A Little Help needed to walk in hospital room?: A Little Help needed climbing 3-5 steps with a railing? : A Little 6 Click Score: 18    End of Session   Activity Tolerance: Patient tolerated treatment well Patient left: in chair;with call bell/phone within reach;with family/visitor present Nurse Communication: Mobility status PT Visit Diagnosis: Repeated falls (R29.6);Unsteadiness on feet (R26.81);Pain Pain - part of body:  (back)    Time: 8599-8567 PT Time Calculation (min) (ACUTE ONLY): 32 min   Charges:  PT Evaluation $PT Eval Moderate Complexity: 1 Mod   PT General Charges $$ ACUTE PT VISIT: 1 Visit         Zenab Gronewold, PT, GCS 07/30/24,3:10 PM

## 2024-07-30 NOTE — Assessment & Plan Note (Signed)
 Will continue statin therapy

## 2024-07-30 NOTE — Consult Note (Signed)
 Consulting Department:  Inpatient medicine  Primary Physician:  [facility]  Chief Complaint: Compression fracture  History of Present Illness: 07/30/2024 Peter Webster is a 84 y.o. male who presents with the chief complaint of compression fracture.  He suffered a mechanical fall.  He lost balance did not have any syncope.  Fell onto his back and had back pain.  No radiating leg pain.  No new weakness numbness or tingling.  No new bowel or bladder incontinence.  Review of Systems:  A 10 point review of systems is negative, except for the pertinent positives and negatives detailed in the HPI.  Past Medical History: Past Medical History:  Diagnosis Date   Acute kidney failure    due to chemo   Allergy    Arthritis    Bladder cancer (HCC)    Chronic pain syndrome    DDD (degenerative disc disease), lumbar    ED (erectile dysfunction)    Elevated PSA    GERD (gastroesophageal reflux disease)    Headache    due to sinuses   Hernia of anterior abdominal wall    Hyperlipidemia    Hypertension    Multiple myeloma not having achieved remission (HCC)    Right inguinal hernia    Sleep apnea    uses CPAP   Thrombocytopenia    Wedge compression fracture of t11-T12 vertebra, sequela     Past Surgical History: Past Surgical History:  Procedure Laterality Date   bilateral hernia  2018   COLONOSCOPY  2019   INSERTION OF MESH  12/18/2021   Procedure: INSERTION OF MESH;  Surgeon: Lane Shope, MD;  Location: [facility] ORS;  Service: General;;   XI ROBOTIC ASSISTED VENTRAL HERNIA N/A 12/18/2021   Procedure: XI ROBOTIC ASSISTED VENTRAL HERNIA, incisional;  Surgeon: Lane Shope, MD;  Location: [facility] ORS;  Service: General;  Laterality: N/A;    Allergies: Allergies as of 07/29/2024 - Review Complete 07/29/2024  Allergen Reaction Noted   Lisinopril Cough 08/17/2017    Medications: Current Medications[1]   Social History: Social History[2]  Family Medical  History: Family History  Problem Relation Age of Onset   Stroke Mother    Cervical cancer Mother     Physical Examination: Vitals:   07/30/24 0515 07/30/24 0732  BP: 130/72 131/75  Pulse: 98 (!) 103  Resp: 18 19  Temp: 98.3 F (36.8 C) 99.4 F (37.4 C)  SpO2: 100% 95%     General: Patient is well developed, well nourished, calm, collected, and in no apparent distress.  NEUROLOGICAL:  General: In no acute distress.   Awake, alert, oriented to person, place, and time.  Pupils equal round and reactive to light.  Facial tone is symmetric.  Tongue protrusion is midline.  There is no pronator drift.   Strength: No major deficits noted in the bilateral lower extremities.  Moving at his baseline.  Imaging: CT ABDOMEN PELVIS WO CONTRAST Result Date: 07/29/2024 EXAM: CT ABDOMEN AND PELVIS WITHOUT CONTRAST 07/29/2024 08:34:16 PM TECHNIQUE: CT of the abdomen and pelvis was performed without the administration of intravenous contrast. Multiplanar reformatted images are provided for review. Automated exposure control, iterative reconstruction, and/or weight-based adjustment of the mA/kV was utilized to reduce the radiation dose to as low as reasonably achievable. COMPARISON: CT chest 01/27/2024. CLINICAL HISTORY: Abdominal trauma, blunt; back pain; possible epigastric pain; possible urinary tract infection. Patient fell about 1 week ago. FINDINGS: LOWER CHEST: Ground glass opacities or atelectasis demonstrated in the lung bases. LIVER: The liver is unremarkable. GALLBLADDER  AND BILE DUCTS: Gallbladder is unremarkable. No biliary ductal dilatation. SPLEEN: No acute abnormality. PANCREAS: No acute abnormality. ADRENAL GLANDS: No acute abnormality. KIDNEYS, URETERS AND BLADDER: Bilateral renal cysts. The largest on the left measures 4.2 cm in diameter. Visualized kidneys are unchanged. No imaging follow-up is indicated. Per consensus, no follow-up is needed for simple Bosniak type 1 and 2 renal cysts,  unless the patient has a malignancy history or risk factors. No hydronephrosis or hydroureter. No renal or ureteral stones. No perinephric or periureteral stranding. Diffuse bladder wall thickening with stranding in the pelvic fat and gas in the bladder. This is likely to indicate cystitis with a gas-forming organism. Gas could also have been instilled into the bladder by catheterization if this procedure has been performed. Less likely, this would be due to a fistula. GI AND BOWEL: Mild esophageal hiatal hernia. Dilated distal esophagus may be due to reflux or dysmotility. Stomach demonstrates no acute abnormality. Small bowel and colon are not abnormally distended. No inflammatory infiltration around the bowel. There is no bowel obstruction. PERITONEUM AND RETROPERITONEUM: No ascites. No free air. No free fluid in the abdomen. VASCULATURE: Calcification of the aorta without aneurysm. Aorta is normal in caliber. LYMPH NODES: No significant lymphadenopathy. REPRODUCTIVE ORGANS: Prostate gland is not enlarged. BONES AND SOFT TISSUES: Postoperative changes consistent with mesh hernia repair in the low pelvis. Small periumbilical hernia containing fat. Degenerative changes in the hips. No acute osseous abnormality. No focal soft tissue abnormality. See additional report of CT lumbar spine. IMPRESSION: 1. Diffuse bladder wall thickening with perivesical stranding and intravesical gas, most consistent with cystitis due to a gas-forming organism, with catheter-related intravesical gas as an alternative consideration and fistula less likely. 2. No acute traumatic abdominopelvic injury identified. 3. Mild hiatal hernia with distal esophageal dilatation, which may be due to reflux or dysmotility. Electronically signed by: Elsie Gravely MD 07/29/2024 08:46 PM EST RP Workstation: [id number]   CT L-SPINE NO CHARGE Result Date: 07/29/2024 EXAM: CT OF THE LUMBAR SPINE WITHOUT CONTRAST 07/29/2024 08:34:16 PM TECHNIQUE: CT of  the lumbar spine was performed without the administration of intravenous contrast. Multiplanar reformatted images are provided for review. Colon images are reconstructed from a contemporaneous CT abdomen and pelvis. Automated exposure control, iterative reconstruction, and/or weight based adjustment of the mA/kV was utilized to reduce the radiation dose to as low as reasonably achievable. COMPARISON: CT lumbar spine 02/22/2023 and lumbar spine radiographs 03/02/2023. CLINICAL HISTORY: Low back pain and lower abdominal pain since the fall 1 week ago. FINDINGS: BONES AND ALIGNMENT: Normal alignment of the lumbar spine. Compression of the superior endplate of T12 appears unchanged since the prior study. Compression of the superior endplate of L3 demonstrates mild progression since prior study. Compression of L4 vertebra has progressed since prior study with about 80% loss of height centrally. This could indicate acute progression of compression fracture in the setting of trauma. No suspicious bone lesion. DEGENERATIVE CHANGES: Degenerative changes in the lumbar spine with narrowed interspaces and endplate osteophyte formation. Degenerative disc disease at multiple levels. Degenerative changes in the facet joints. 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 SOFT TISSUES: No abnormal paraspinal soft tissue mass or infiltration. Paraspinal muscles are symmetrical. See additional report of CT abdomen and pelvis for non lumbar spine findings. IMPRESSION: 1. Progression of L4 vertebral compression fracture  with about 80% loss of height centrally, possibly acute in the setting of trauma. 2. Mild progression of L3 superior endplate compression since prior study. 3. Unchanged T12 superior endplate compression since prior  study. 4. 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 Electronically signed by: Elsie Gravely MD 07/29/2024 08:40 PM EST RP Workstation: [id number]   DG Thoracic Spine 2 View Result Date: 07/29/2024 EXAM: 2 VIEW(S) XRAY OF THE THORACIC SPINE 07/29/2024 07:33:14 PM COMPARISON: CT examination of 01/27/2024. CLINICAL HISTORY: fall 1 week Fall one week ago. FINDINGS: BONES: Superior endplate fracture of T12 is noted, grossly stable since CT examination of 01/27/2024. No acute fracture or listhesis of the upper and mid thoracic spine. Alignment demonstrates exaggeration of the thoracic kyphosis. DISCS AND DEGENERATIVE CHANGES: Endplate remodeling and disc osteophyte formation throughout the mid and lower thoracic spine is present in keeping with changes of moderate degenerative disc disease. SOFT TISSUES: The visualized lungs are clear. IMPRESSION: 1. No acute fracture or listhesis of the thoracic spine. 2. Chronic superior endplate fracture of T12. 3. Exaggerated thoracic kyphosis. Electronically signed by: Dorethia Molt MD 07/29/2024 08:08 PM EST RP Workstation: [id number]     I have personally reviewed the images and agree with the above interpretation.  Labs:    Latest Ref Rng & Units 07/30/2024    6:11 AM 07/29/2024    4:20 PM 12/16/2021    2:00 PM  CBC  WBC 4.0 - 10.5 K/uL 5.3  4.8  5.6   Hemoglobin 13.0 - 17.0 g/dL 87.3  85.8  86.8   Hematocrit 39.0 - 52.0 % 37.8  42.9  39.6   Platelets 150 - 400 K/uL 53  61  174       Latest Ref Rng & Units 07/30/2024    6:11 AM 07/29/2024    4:20 PM 11/20/2021    2:21 PM  BMP  Glucose 70 - 99 mg/dL 892  858  860   BUN 8 - 23 mg/dL 26  30  22    Creatinine 0.61 - 1.24 mg/dL 8.19  8.05  8.03   BUN/Creat Ratio 10 - 24   11   Sodium 135 - 145 mmol/L 138  138  142   Potassium 3.5 - 5.1 mmol/L 4.6  4.1  3.7   Chloride 98 - 111 mmol/L 107  104  104   CO2 22 - 32 mmol/L 22  21    Calcium  8.9 - 10.3  mg/dL 9.8  89.4  89.8     INR  1.2 (01/24 0611)   Assessment and Plan: Mr. Frankson is a pleasant 84 y.o. male with history of numerous lumbar compression fractures as well as thoracic compression fractures.  Some worsening of his L3 and L4 compression fractures noted and a newer T12 compression fracture noted. None of these have any significant retropulsion.  But likely are related to his acute worsening of back pain after his mechanical fall.  He is at his neurological baseline.  No neurologic signs or symptoms.  At this time is mostly some mechanical back pain.  We discussed use of a TLSO brace, he previously deferred this given irritation on his skin and difficulty with breathing.  These do not appear to be unstable fractures and can be treated nonoperatively and without bracing, okay for pain control regimen.  We can follow him up in clinic with upright x-rays of his thoracolumbar spine.   Penne MICAEL Sharps, MD/MSCR Dept. of Neurosurgery       [  1]  Current Facility-Administered Medications:    acetaminophen  (TYLENOL ) tablet 650 mg, 650 mg, Oral, Q6H PRN, 650 mg at 07/29/24 2317 **OR** acetaminophen  (TYLENOL ) suppository 650 mg, 650 mg, Rectal, Q6H PRN, Mansy, Jan A, MD   amLODipine  (NORVASC ) tablet 5 mg, 5 mg, Oral, Daily, Mansy, Jan A, MD   ammonium lactate  (LAC-HYDRIN ) 12 % lotion 1 Application, 1 Application, Topical, PRN, Mansy, Madison LABOR, MD   calcium -vitamin D  (OSCAL WITH D) 500-5 MG-MCG per tablet 2 tablet, 2 tablet, Oral, Q breakfast, Mansy, Jan A, MD   cefTRIAXone  (ROCEPHIN ) 1 g in sodium chloride  0.9 % 100 mL IVPB, 1 g, Intravenous, Q24H, Mansy, Jan A, MD   enoxaparin  (LOVENOX ) injection 50 mg, 50 mg, Subcutaneous, Q24H, Mansy, Jan A, MD   famotidine  (PEPCID ) tablet 20 mg, 20 mg, Oral, Daily, Mansy, Jan A, MD   FLUoxetine  (PROZAC ) capsule 20 mg, 20 mg, Oral, Daily, Mansy, Jan A, MD   gabapentin  (NEURONTIN ) capsule 300 mg, 300 mg, Oral, QHS, Mansy, Jan A, MD, 300 mg at 07/29/24  2251   lactated ringers  infusion, 150 mL/hr, Intravenous, Continuous, Mansy, Jan A, MD, Last Rate: 150 mL/hr at 07/30/24 0554, 150 mL/hr at 07/30/24 0554   loratadine  (CLARITIN ) tablet 10 mg, 10 mg, Oral, Daily, Mansy, Jan A, MD   losartan  (COZAAR ) tablet 25 mg, 25 mg, Oral, Daily, Mansy, Jan A, MD   magnesium  hydroxide (MILK OF MAGNESIA) suspension 30 mL, 30 mL, Oral, Daily PRN, Mansy, Jan A, MD   morphine  (PF) 2 MG/ML injection 2 mg, 2 mg, Intravenous, Q4H PRN, Mansy, Jan A, MD, 2 mg at 07/30/24 9483   ondansetron  (ZOFRAN ) tablet 4 mg, 4 mg, Oral, Q6H PRN **OR** ondansetron  (ZOFRAN ) injection 4 mg, 4 mg, Intravenous, Q6H PRN, Mansy, Jan A, MD   pantoprazole  (PROTONIX ) EC tablet 40 mg, 40 mg, Oral, Daily, Mansy, Jan A, MD   rosuvastatin  (CRESTOR ) tablet 20 mg, 20 mg, Oral, Daily, Mansy, Jan A, MD   tiZANidine  (ZANAFLEX ) tablet 4 mg, 4 mg, Oral, Q8H PRN, Mansy, Jan A, MD   traZODone  (DESYREL ) tablet 25 mg, 25 mg, Oral, QHS PRN, Mansy, Madison LABOR, MD [2]  Social History Tobacco Use   Smoking status: Never   Smokeless tobacco: Never  Vaping Use   Vaping status: Never Used  Substance Use Topics   Alcohol use: Yes    Alcohol/week: 1.0 standard drink of alcohol    Types: 1 Cans of beer per week    Comment: occ beer   Drug use: No

## 2024-07-30 NOTE — Progress Notes (Addendum)
 " PROGRESS NOTE    Peter Webster  FMW:[id number] DOB: 07-29-1940 DOA: 07/29/2024 [facility]  Subjective: No acute events overnight. Seen and examined at bedside. Reports feeling better with improvement in back pain. Denies dysuria, hematuria, urinary urgency/frequency, nausea, vomiting.  Hospital Course:  84 y.o. Caucasian male with medical history significant for osteoarthritis, GERD, DDD, chronic pain syndrome, hypertension, dyslipidemia, multiple myeloma, and OSA on CPAP, who presented to the emergency room with acute onset of mechanical fall.  The patient stated that he lost balance.  He denied any presyncope or syncope.  No head injuries.  He fell on his back with subsequent worsening mid back pain.  He admitted to urinary frequency and urgency without dysuria or hematuria.  No fever or chills.  He admitted to bilateral flank pain.  No cough or wheezing or dyspnea.  No chest pain or palpitations.  No new paresthesias or focal muscle weakness.   Admitted for sepsis secondary to acute cystitis, worsening vertebral fractures, and acute debilitation.   Assessment and Plan:  Sepsis due to gram-negative UTI (HCC) - Sepsis is manifested by tachycardia and tachypnea. - Will continue antibiotic therapy with IV Rocephin . - stop IVFs - blood cultures not done on admission - urine cultures pending  Possible worsening L3 and L4 compression fractures Chronic T12 compression fracture (HCC) - seen by Neurosurgery with no plan for acute intervention as neurologically intact  - cont tylenol  PRN - resume home tramadol  PRN for moderate pain - start oxycodone  2.5mg  q4h PRN for severe pain - stop IV morphine  PRN as able to take PO meds at this time - start bowel regimen to avoid opioid-induced constipation - PT/OT eval pending  Thrombocytopenia Hx of multiple myeloma - platelets 53 < 61, baseline unclear - etiology unclear, maybe in the setting of acute infection - LFTs  unremarkable - CT A/P with no hepalosplenomegaly - PT/INR only mildly elevated/borderline - D-dimer 2.62, likely elevated due to acute infection - fibrinogen unremarkable - PTT pending  - monitor CBC  Elevated lipase - lipase 534 - etiology unclear - asymptomatic at present - CT A/P with no concern for acute pancreatitis - monitor clinically  GERD without esophagitis - Will continue PPI therapy.  Dyslipidemia - Will continue statin therapy.  Essential hypertension Will continue antihypertensive therapy.  DVT prophylaxis:   Lovenox    Code Status: Full Code  Disposition Plan: TBD Reason for continuing need for hospitalization: IV antibiotics, PT/OT eval pending  Objective: Vitals:   07/29/24 2130 07/29/24 2308 07/30/24 0515 07/30/24 0732  BP: 131/73  130/72 131/75  Pulse: 100  98 (!) 103  Resp: (!) 21 19 18 19   Temp: 98.2 F (36.8 C)  98.3 F (36.8 C) 99.4 F (37.4 C)  TempSrc:      SpO2: 100%  100% 95%  Weight:      Height:        Intake/Output Summary (Last 24 hours) at 07/30/2024 1044 Last data filed at 07/30/2024 0840 Gross per 24 hour  Intake 661.6 ml  Output 250 ml  Net 411.6 ml   Filed Weights   07/29/24 1618  Weight: 97.5 kg    Examination:  Physical Exam Vitals and nursing note reviewed.  Constitutional:      General: He is not in acute distress.    Appearance: He is ill-appearing.     Comments: Weak, frail  Neurological:     Mental Status: He is alert.     Data Reviewed: I have personally reviewed following  labs and imaging studies  CBC: Recent Labs  Lab 07/29/24 1620 07/30/24 0611 07/30/24 0841  WBC 4.8 5.3  --   NEUTROABS  --   --  3.1  HGB 14.1 12.6*  --   HCT 42.9 37.8*  --   MCV 99.1 97.4  --   PLT 61* 53*  --    Basic Metabolic Panel: Recent Labs  Lab 07/29/24 1620 07/30/24 0611  NA 138 138  K 4.1 4.6  CL 104 107  CO2 21* 22  GLUCOSE 141* 107*  BUN 30* 26*  CREATININE 1.94* 1.80*  CALCIUM  10.5* 9.8    GFR: Estimated Creatinine Clearance: 36.4 mL/min (A) (by C-G formula based on SCr of 1.8 mg/dL (H)). Liver Function Tests: Recent Labs  Lab 07/29/24 1620  AST 49*  ALT 38  ALKPHOS 143*  BILITOT 0.7  PROT 7.7  ALBUMIN 3.8   Recent Labs  Lab 07/29/24 1620  LIPASE 534*   No results for input(s): AMMONIA in the last 168 hours. Coagulation Profile: Recent Labs  Lab 07/30/24 0611  INR 1.2   Cardiac Enzymes: No results for input(s): CKTOTAL, CKMB, CKMBINDEX, TROPONINI in the last 168 hours. ProBNP, BNP (last 5 results) No results for input(s): PROBNP, BNP in the last 8760 hours. HbA1C: No results for input(s): HGBA1C in the last 72 hours. CBG: No results for input(s): GLUCAP in the last 168 hours. Lipid Profile: No results for input(s): CHOL, HDL, LDLCALC, TRIG, CHOLHDL, LDLDIRECT in the last 72 hours. Thyroid Function Tests: No results for input(s): TSH, T4TOTAL, FREET4, T3FREE, THYROIDAB in the last 72 hours. Anemia Panel: No results for input(s): VITAMINB12, FOLATE, FERRITIN, TIBC, IRON, RETICCTPCT in the last 72 hours. Sepsis Labs: No results for input(s): PROCALCITON, LATICACIDVEN in the last 168 hours.  No results found for this or any previous visit (from the past 240 hours).   Radiology Studies: CT ABDOMEN PELVIS WO CONTRAST Result Date: 07/29/2024 EXAM: CT ABDOMEN AND PELVIS WITHOUT CONTRAST 07/29/2024 08:34:16 PM TECHNIQUE: CT of the abdomen and pelvis was performed without the administration of intravenous contrast. Multiplanar reformatted images are provided for review. Automated exposure control, iterative reconstruction, and/or weight-based adjustment of the mA/kV was utilized to reduce the radiation dose to as low as reasonably achievable. COMPARISON: CT chest 01/27/2024. CLINICAL HISTORY: Abdominal trauma, blunt; back pain; possible epigastric pain; possible urinary tract infection. Patient fell about 1  week ago. FINDINGS: LOWER CHEST: Ground glass opacities or atelectasis demonstrated in the lung bases. LIVER: The liver is unremarkable. GALLBLADDER AND BILE DUCTS: Gallbladder is unremarkable. No biliary ductal dilatation. SPLEEN: No acute abnormality. PANCREAS: No acute abnormality. ADRENAL GLANDS: No acute abnormality. KIDNEYS, URETERS AND BLADDER: Bilateral renal cysts. The largest on the left measures 4.2 cm in diameter. Visualized kidneys are unchanged. No imaging follow-up is indicated. Per consensus, no follow-up is needed for simple Bosniak type 1 and 2 renal cysts, unless the patient has a malignancy history or risk factors. No hydronephrosis or hydroureter. No renal or ureteral stones. No perinephric or periureteral stranding. Diffuse bladder wall thickening with stranding in the pelvic fat and gas in the bladder. This is likely to indicate cystitis with a gas-forming organism. Gas could also have been instilled into the bladder by catheterization if this procedure has been performed. Less likely, this would be due to a fistula. GI AND BOWEL: Mild esophageal hiatal hernia. Dilated distal esophagus may be due to reflux or dysmotility. Stomach demonstrates no acute abnormality. Small bowel and colon are not abnormally  distended. No inflammatory infiltration around the bowel. There is no bowel obstruction. PERITONEUM AND RETROPERITONEUM: No ascites. No free air. No free fluid in the abdomen. VASCULATURE: Calcification of the aorta without aneurysm. Aorta is normal in caliber. LYMPH NODES: No significant lymphadenopathy. REPRODUCTIVE ORGANS: Prostate gland is not enlarged. BONES AND SOFT TISSUES: Postoperative changes consistent with mesh hernia repair in the low pelvis. Small periumbilical hernia containing fat. Degenerative changes in the hips. No acute osseous abnormality. No focal soft tissue abnormality. See additional report of CT lumbar spine. IMPRESSION: 1. Diffuse bladder wall thickening with  perivesical stranding and intravesical gas, most consistent with cystitis due to a gas-forming organism, with catheter-related intravesical gas as an alternative consideration and fistula less likely. 2. No acute traumatic abdominopelvic injury identified. 3. Mild hiatal hernia with distal esophageal dilatation, which may be due to reflux or dysmotility. Electronically signed by: Elsie Gravely MD 07/29/2024 08:46 PM EST RP Workstation: [id number]   CT L-SPINE NO CHARGE Result Date: 07/29/2024 EXAM: CT OF THE LUMBAR SPINE WITHOUT CONTRAST 07/29/2024 08:34:16 PM TECHNIQUE: CT of the lumbar spine was performed without the administration of intravenous contrast. Multiplanar reformatted images are provided for review. Colon images are reconstructed from a contemporaneous CT abdomen and pelvis. Automated exposure control, iterative reconstruction, and/or weight based adjustment of the mA/kV was utilized to reduce the radiation dose to as low as reasonably achievable. COMPARISON: CT lumbar spine 02/22/2023 and lumbar spine radiographs 03/02/2023. CLINICAL HISTORY: Low back pain and lower abdominal pain since the fall 1 week ago. FINDINGS: BONES AND ALIGNMENT: Normal alignment of the lumbar spine. Compression of the superior endplate of T12 appears unchanged since the prior study. Compression of the superior endplate of L3 demonstrates mild progression since prior study. Compression of L4 vertebra has progressed since prior study with about 80% loss of height centrally. This could indicate acute progression of compression fracture in the setting of trauma. No suspicious bone lesion. DEGENERATIVE CHANGES: Degenerative changes in the lumbar spine with narrowed interspaces and endplate osteophyte formation. Degenerative disc disease at multiple levels. Degenerative changes in the facet joints. 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 SOFT TISSUES: No abnormal paraspinal soft tissue mass or infiltration. Paraspinal muscles are symmetrical. See additional report of CT abdomen and pelvis for non lumbar spine findings. IMPRESSION: 1. Progression of L4 vertebral compression fracture with about 80% loss of height centrally, possibly acute in the setting of trauma. 2. Mild progression of L3 superior endplate compression since prior study. 3. Unchanged T12 superior endplate compression since prior study. 4. 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 Electronically signed by: Elsie Gravely MD 07/29/2024 08:40 PM EST RP Workstation: [id number]   DG Thoracic Spine 2 View Result Date: 07/29/2024 EXAM: 2 VIEW(S) XRAY OF THE THORACIC SPINE 07/29/2024 07:33:14 PM COMPARISON: CT examination of 01/27/2024. CLINICAL HISTORY: fall 1 week Fall one week ago. FINDINGS: BONES: Superior endplate fracture of T12 is noted, grossly stable since CT examination of 01/27/2024. No acute fracture or listhesis of the upper and mid thoracic spine. Alignment demonstrates exaggeration of the thoracic kyphosis. DISCS AND DEGENERATIVE CHANGES: Endplate remodeling and disc osteophyte formation throughout the mid and lower thoracic spine is present in keeping with changes of moderate degenerative disc disease. SOFT TISSUES: The visualized lungs are clear.  IMPRESSION: 1. No acute fracture or listhesis of the thoracic spine. 2. Chronic superior endplate fracture of T12. 3. Exaggerated thoracic kyphosis. Electronically signed by: Dorethia Molt MD 07/29/2024 08:08 PM EST RP Workstation: [id number]    Scheduled Meds:  amLODipine   5 mg Oral Daily   calcium -vitamin D   2 tablet Oral Q breakfast   enoxaparin  (LOVENOX ) injection  50 mg Subcutaneous Q24H   famotidine   20 mg Oral Daily   FLUoxetine   20 mg  Oral Daily   gabapentin   300 mg Oral QHS   loratadine   10 mg Oral Daily   losartan   25 mg Oral Daily   pantoprazole   40 mg Oral Daily   rosuvastatin   20 mg Oral Daily   Continuous Infusions:  cefTRIAXone  (ROCEPHIN )  IV     lactated ringers  150 mL/hr (07/30/24 0554)     LOS: 1 day   Norval Bar, MD  [facility]  07/30/2024, 10:44 AM   "

## 2024-07-30 NOTE — Assessment & Plan Note (Signed)
-   This could be acute on chronic compression fracture. - Pain management will be provided. - Neurosurgery consult to be obtained. - I notified Dr. Claudene about the patient

## 2024-07-30 NOTE — Assessment & Plan Note (Signed)
-   The patient be admitted to a telemetry bed. - Will continue antibiotic therapy with IV Rocephin . - Will continue hydration with IV lactated ringer . - Will follow blood and urine cultures. - Sepsis is manifested by tachycardia and tachypnea.

## 2024-07-31 DIAGNOSIS — N390 Urinary tract infection, site not specified: Secondary | ICD-10-CM | POA: Diagnosis not present

## 2024-07-31 DIAGNOSIS — A4150 Gram-negative sepsis, unspecified: Secondary | ICD-10-CM | POA: Diagnosis not present

## 2024-07-31 LAB — BASIC METABOLIC PANEL WITH GFR
Anion gap: 10 (ref 5–15)
BUN: 25 mg/dL — ABNORMAL HIGH (ref 8–23)
CO2: 21 mmol/L — ABNORMAL LOW (ref 22–32)
Calcium: 9.9 mg/dL (ref 8.9–10.3)
Chloride: 103 mmol/L (ref 98–111)
Creatinine, Ser: 1.75 mg/dL — ABNORMAL HIGH (ref 0.61–1.24)
GFR, Estimated: 38 mL/min — ABNORMAL LOW
Glucose, Bld: 96 mg/dL (ref 70–99)
Potassium: 4.5 mmol/L (ref 3.5–5.1)
Sodium: 134 mmol/L — ABNORMAL LOW (ref 135–145)

## 2024-07-31 LAB — CBC
HCT: 35.9 % — ABNORMAL LOW (ref 39.0–52.0)
Hemoglobin: 12 g/dL — ABNORMAL LOW (ref 13.0–17.0)
MCH: 32.4 pg (ref 26.0–34.0)
MCHC: 33.4 g/dL (ref 30.0–36.0)
MCV: 97 fL (ref 80.0–100.0)
Platelets: 60 10*3/uL — ABNORMAL LOW (ref 150–400)
RBC: 3.7 MIL/uL — ABNORMAL LOW (ref 4.22–5.81)
RDW: 16.8 % — ABNORMAL HIGH (ref 11.5–15.5)
WBC: 4.3 10*3/uL (ref 4.0–10.5)
nRBC: 0 % (ref 0.0–0.2)

## 2024-07-31 NOTE — Progress Notes (Signed)
 " PROGRESS NOTE    Peter Webster  FMW:[id number] DOB: 1941/05/25 DOA: 07/29/2024 [facility]  Subjective: No acute events overnight. Seen and examined at bedside with wife present. Reports feeling better. Denies dysuria, hematuria, urinary urgency/frequency, nausea, vomiting.    Hospital Course: 84 y.o. Caucasian male with medical history significant for osteoarthritis, GERD, DDD, chronic pain syndrome, hypertension, dyslipidemia, multiple myeloma, and OSA on CPAP, who presented to the emergency room with acute onset of mechanical fall.  The patient stated that he lost balance.  He denied any presyncope or syncope.  No head injuries.  He fell on his back with subsequent worsening mid back pain.  He admitted to urinary frequency and urgency without dysuria or hematuria.  No fever or chills.  He admitted to bilateral flank pain.  No cough or wheezing or dyspnea.  No chest pain or palpitations.  No new paresthesias or focal muscle weakness.    Admitted for sepsis secondary to acute cystitis, worsening vertebral fractures, and acute debilitation.   Assessment and Plan:  Sepsis due to gram-negative UTI (HCC) - Sepsis is manifested by tachycardia and tachypnea. - Will continue antibiotic therapy with IV Rocephin . - blood cultures not done on admission - urine cultures pending   Possible worsening L3 and L4 compression fractures Chronic T12 compression fracture (HCC) - seen by Neurosurgery with no plan for acute intervention as neurologically intact  - cont tylenol  PRN - resume home tramadol  PRN for moderate pain - cont oxycodone  2.5mg  q4h PRN for severe pain - stopped IV morphine  PRN as able to take PO meds at this time - cont bowel regimen to avoid opioid-induced constipation - PT/OT following, plan for outpatient PT    Thrombocytopenia Hx of multiple myeloma - platelets 60 < 53, baseline unclear - etiology unclear, maybe in the setting of acute infection - LFTs  unremarkable - CT A/P with no hepalosplenomegaly - PT/INR only mildly elevated/borderline - D-dimer 2.62, likely elevated due to acute infection - fibrinogen unremarkable - monitor CBC   Elevated lipase - lipase 534 - etiology unclear - asymptomatic at present - CT A/P with no concern for acute pancreatitis - monitor clinically   GERD without esophagitis - Will continue PPI therapy.   Dyslipidemia - Will continue statin therapy.   Essential hypertension Will continue antihypertensive therapy.  DVT prophylaxis:   Lovenox    Code Status: Full Code Family Communication: updated wife at bedside Disposition Plan: Home with outpatient PT Reason for continuing need for hospitalization: IV antibiotics  Objective: Vitals:   07/30/24 1443 07/30/24 1927 07/31/24 0407 07/31/24 0921  BP: 108/76 117/72 128/74 105/74  Pulse: (!) 101 (!) 101 93 91  Resp: 17 15 16 16   Temp: 98.5 F (36.9 C) 99.3 F (37.4 C) 99 F (37.2 C) 97.9 F (36.6 C)  TempSrc:  Oral    SpO2: 97% 96% 96% 96%  Weight:      Height:        Intake/Output Summary (Last 24 hours) at 07/31/2024 1055 Last data filed at 07/31/2024 0300 Gross per 24 hour  Intake 712.83 ml  Output 550 ml  Net 162.83 ml   Filed Weights   07/29/24 1618  Weight: 97.5 kg    Examination:  Physical Exam Vitals and nursing note reviewed.  Constitutional:      General: He is not in acute distress.    Appearance: He is ill-appearing.  HENT:     Head: Normocephalic and atraumatic.  Cardiovascular:     Rate and  Rhythm: Normal rate and regular rhythm.     Pulses: Normal pulses.     Heart sounds: Normal heart sounds.  Pulmonary:     Effort: Pulmonary effort is normal.     Breath sounds: Normal breath sounds.  Abdominal:     General: Bowel sounds are normal.     Palpations: Abdomen is soft.  Neurological:     Mental Status: He is alert.     Data Reviewed: I have personally reviewed following labs and imaging  studies  CBC: Recent Labs  Lab 07/29/24 1620 07/30/24 0611 07/30/24 0841 07/31/24 0611  WBC 4.8 5.3  --  4.3  NEUTROABS  --   --  3.1  --   HGB 14.1 12.6*  --  12.0*  HCT 42.9 37.8*  --  35.9*  MCV 99.1 97.4  --  97.0  PLT 61* 53*  --  60*   Basic Metabolic Panel: Recent Labs  Lab 07/29/24 1620 07/30/24 0611 07/31/24 0611  NA 138 138 134*  K 4.1 4.6 4.5  CL 104 107 103  CO2 21* 22 21*  GLUCOSE 141* 107* 96  BUN 30* 26* 25*  CREATININE 1.94* 1.80* 1.75*  CALCIUM  10.5* 9.8 9.9   GFR: Estimated Creatinine Clearance: 37.4 mL/min (A) (by C-G formula based on SCr of 1.75 mg/dL (H)). Liver Function Tests: Recent Labs  Lab 07/29/24 1620  AST 49*  ALT 38  ALKPHOS 143*  BILITOT 0.7  PROT 7.7  ALBUMIN 3.8   Recent Labs  Lab 07/29/24 1620  LIPASE 534*   No results for input(s): AMMONIA in the last 168 hours. Coagulation Profile: Recent Labs  Lab 07/30/24 0611  INR 1.2   Cardiac Enzymes: No results for input(s): CKTOTAL, CKMB, CKMBINDEX, TROPONINI in the last 168 hours. ProBNP, BNP (last 5 results) No results for input(s): PROBNP, BNP in the last 8760 hours. HbA1C: No results for input(s): HGBA1C in the last 72 hours. CBG: No results for input(s): GLUCAP in the last 168 hours. Lipid Profile: No results for input(s): CHOL, HDL, LDLCALC, TRIG, CHOLHDL, LDLDIRECT in the last 72 hours. Thyroid Function Tests: No results for input(s): TSH, T4TOTAL, FREET4, T3FREE, THYROIDAB in the last 72 hours. Anemia Panel: No results for input(s): VITAMINB12, FOLATE, FERRITIN, TIBC, IRON, RETICCTPCT in the last 72 hours. Sepsis Labs: No results for input(s): PROCALCITON, LATICACIDVEN in the last 168 hours.  No results found for this or any previous visit (from the past 240 hours).   Radiology Studies: CT ABDOMEN PELVIS WO CONTRAST Result Date: 07/29/2024 EXAM: CT ABDOMEN AND PELVIS WITHOUT CONTRAST 07/29/2024  08:34:16 PM TECHNIQUE: CT of the abdomen and pelvis was performed without the administration of intravenous contrast. Multiplanar reformatted images are provided for review. Automated exposure control, iterative reconstruction, and/or weight-based adjustment of the mA/kV was utilized to reduce the radiation dose to as low as reasonably achievable. COMPARISON: CT chest 01/27/2024. CLINICAL HISTORY: Abdominal trauma, blunt; back pain; possible epigastric pain; possible urinary tract infection. Patient fell about 1 week ago. FINDINGS: LOWER CHEST: Ground glass opacities or atelectasis demonstrated in the lung bases. LIVER: The liver is unremarkable. GALLBLADDER AND BILE DUCTS: Gallbladder is unremarkable. No biliary ductal dilatation. SPLEEN: No acute abnormality. PANCREAS: No acute abnormality. ADRENAL GLANDS: No acute abnormality. KIDNEYS, URETERS AND BLADDER: Bilateral renal cysts. The largest on the left measures 4.2 cm in diameter. Visualized kidneys are unchanged. No imaging follow-up is indicated. Per consensus, no follow-up is needed for simple Bosniak type 1 and 2 renal cysts, unless  the patient has a malignancy history or risk factors. No hydronephrosis or hydroureter. No renal or ureteral stones. No perinephric or periureteral stranding. Diffuse bladder wall thickening with stranding in the pelvic fat and gas in the bladder. This is likely to indicate cystitis with a gas-forming organism. Gas could also have been instilled into the bladder by catheterization if this procedure has been performed. Less likely, this would be due to a fistula. GI AND BOWEL: Mild esophageal hiatal hernia. Dilated distal esophagus may be due to reflux or dysmotility. Stomach demonstrates no acute abnormality. Small bowel and colon are not abnormally distended. No inflammatory infiltration around the bowel. There is no bowel obstruction. PERITONEUM AND RETROPERITONEUM: No ascites. No free air. No free fluid in the abdomen.  VASCULATURE: Calcification of the aorta without aneurysm. Aorta is normal in caliber. LYMPH NODES: No significant lymphadenopathy. REPRODUCTIVE ORGANS: Prostate gland is not enlarged. BONES AND SOFT TISSUES: Postoperative changes consistent with mesh hernia repair in the low pelvis. Small periumbilical hernia containing fat. Degenerative changes in the hips. No acute osseous abnormality. No focal soft tissue abnormality. See additional report of CT lumbar spine. IMPRESSION: 1. Diffuse bladder wall thickening with perivesical stranding and intravesical gas, most consistent with cystitis due to a gas-forming organism, with catheter-related intravesical gas as an alternative consideration and fistula less likely. 2. No acute traumatic abdominopelvic injury identified. 3. Mild hiatal hernia with distal esophageal dilatation, which may be due to reflux or dysmotility. Electronically signed by: Elsie Gravely MD 07/29/2024 08:46 PM EST RP Workstation: [id number]   CT L-SPINE NO CHARGE Result Date: 07/29/2024 EXAM: CT OF THE LUMBAR SPINE WITHOUT CONTRAST 07/29/2024 08:34:16 PM TECHNIQUE: CT of the lumbar spine was performed without the administration of intravenous contrast. Multiplanar reformatted images are provided for review. Colon images are reconstructed from a contemporaneous CT abdomen and pelvis. Automated exposure control, iterative reconstruction, and/or weight based adjustment of the mA/kV was utilized to reduce the radiation dose to as low as reasonably achievable. COMPARISON: CT lumbar spine 02/22/2023 and lumbar spine radiographs 03/02/2023. CLINICAL HISTORY: Low back pain and lower abdominal pain since the fall 1 week ago. FINDINGS: BONES AND ALIGNMENT: Normal alignment of the lumbar spine. Compression of the superior endplate of T12 appears unchanged since the prior study. Compression of the superior endplate of L3 demonstrates mild progression since prior study. Compression of L4 vertebra has  progressed since prior study with about 80% loss of height centrally. This could indicate acute progression of compression fracture in the setting of trauma. No suspicious bone lesion. DEGENERATIVE CHANGES: Degenerative changes in the lumbar spine with narrowed interspaces and endplate osteophyte formation. Degenerative disc disease at multiple levels. Degenerative changes in the facet joints. 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 SOFT TISSUES: No abnormal paraspinal soft tissue mass or infiltration. Paraspinal muscles are symmetrical. See additional report of CT abdomen and pelvis for non lumbar spine findings. IMPRESSION: 1. Progression of L4 vertebral compression fracture with about 80% loss of height centrally, possibly acute in the setting of trauma. 2. Mild progression of L3 superior endplate compression since prior study. 3. Unchanged T12 superior endplate compression since prior study. 4. 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 Electronically signed by: Elsie Gravely MD 07/29/2024 08:40 PM EST RP Workstation: [id number]  DG Thoracic Spine 2 View Result Date: 07/29/2024 EXAM: 2 VIEW(S) XRAY OF THE THORACIC SPINE 07/29/2024 07:33:14 PM COMPARISON: CT examination of 01/27/2024. CLINICAL HISTORY: fall 1 week Fall one week ago. FINDINGS: BONES: Superior endplate fracture of T12 is noted, grossly stable since CT examination of 01/27/2024. No acute fracture or listhesis of the upper and mid thoracic spine. Alignment demonstrates exaggeration of the thoracic kyphosis. DISCS AND DEGENERATIVE CHANGES: Endplate remodeling and disc osteophyte formation throughout the mid and lower thoracic spine is present in keeping with changes of moderate  degenerative disc disease. SOFT TISSUES: The visualized lungs are clear. IMPRESSION: 1. No acute fracture or listhesis of the thoracic spine. 2. Chronic superior endplate fracture of T12. 3. Exaggerated thoracic kyphosis. Electronically signed by: Dorethia Molt MD 07/29/2024 08:08 PM EST RP Workstation: [id number]    Scheduled Meds:  amLODipine   5 mg Oral Daily   calcium -vitamin D   2 tablet Oral Q breakfast   empagliflozin   10 mg Oral Daily   enoxaparin  (LOVENOX ) injection  50 mg Subcutaneous Q24H   famotidine   20 mg Oral Daily   FLUoxetine   20 mg Oral Daily   gabapentin   300 mg Oral QHS   loratadine   10 mg Oral Daily   losartan   25 mg Oral Daily   pantoprazole   40 mg Oral Daily   rosuvastatin   20 mg Oral Daily   senna  1 tablet Oral Daily   Continuous Infusions:  cefTRIAXone  (ROCEPHIN )  IV 1 g (07/30/24 2156)     LOS: 2 days   Norval Bar, MD  [facility]  07/31/2024, 10:55 AM   "

## 2024-07-31 NOTE — Hospital Course (Signed)
 84 y.o. Caucasian male with medical history significant for osteoarthritis, GERD, DDD, chronic pain syndrome, hypertension, dyslipidemia, multiple myeloma, and OSA on CPAP, who presented to the emergency room with acute onset of mechanical fall.  The patient stated that he lost balance.  He denied any presyncope or syncope.  No head injuries.  He fell on his back with subsequent worsening mid back pain.  He admitted to urinary frequency and urgency without dysuria or hematuria.  No fever or chills.  He admitted to bilateral flank pain.  No cough or wheezing or dyspnea.  No chest pain or palpitations.  No new paresthesias or focal muscle weakness.    Admitted for sepsis secondary to acute cystitis, worsening vertebral fractures, and acute debilitation.

## 2024-07-31 NOTE — Care Management Important Message (Signed)
 Important Message  Patient Details  Name: Peter Webster MRN: [id number] Date of Birth: [birth year]   Important Message Given:  Yes - Medicare IM     Pieper Kasik W, CMA 07/31/2024, 12:18 PM

## 2024-07-31 NOTE — Plan of Care (Signed)

## 2024-08-01 ENCOUNTER — Other Ambulatory Visit: Payer: Self-pay

## 2024-08-01 DIAGNOSIS — S32040A Wedge compression fracture of fourth lumbar vertebra, initial encounter for closed fracture: Secondary | ICD-10-CM

## 2024-08-01 DIAGNOSIS — A4150 Gram-negative sepsis, unspecified: Secondary | ICD-10-CM | POA: Diagnosis not present

## 2024-08-01 DIAGNOSIS — N390 Urinary tract infection, site not specified: Secondary | ICD-10-CM | POA: Diagnosis not present

## 2024-08-01 LAB — URINE CULTURE: Culture: >=100000 — AB

## 2024-08-01 MED ORDER — SENNA 8.6 MG PO TABS
1.0000 | ORAL_TABLET | Freq: Every day | ORAL | 0 refills | Status: AC
  Filled 2024-08-01: qty 30, 30d supply, fill #0

## 2024-08-01 MED ORDER — SULFAMETHOXAZOLE-TRIMETHOPRIM 800-160 MG PO TABS
1.0000 | ORAL_TABLET | Freq: Two times a day (BID) | ORAL | 0 refills | Status: AC
  Filled 2024-08-01: qty 8, 4d supply, fill #0

## 2024-08-01 MED ORDER — OXYCODONE HCL 5 MG PO TABS
2.5000 mg | ORAL_TABLET | ORAL | 0 refills | Status: AC | PRN
  Filled 2024-08-01: qty 15, 5d supply, fill #0

## 2024-08-01 NOTE — Plan of Care (Signed)

## 2024-08-01 NOTE — Discharge Instructions (Addendum)
 Take medications as prescribed Follow up with PCP within one week Follow up with neurosurgery within 2 weeks

## 2024-08-01 NOTE — Discharge Summary (Signed)
 " [facility] Physician Discharge Summary   Patient name: Peter Webster  Admit date:     07/29/2024  Discharge date: 08/01/2024  Attending Physician: LAWENCE MADISON LABOR [[id number]]  Discharge Physician: Norval Bar   PCP: [facility]  Admitted From: Home  Disposition:  Home  Recommendations for Outpatient Follow-up:  Follow up with PCP in 1-2 weeks Take bactrim  DS twice daily to finish 7 days total antibiotic course Follow up with neurosurgery clinic within 2 weeks  Home Health:No Equipment/Devices: @ECDMELIST @  Discharge Condition:Stable CODE STATUS:FULL Diet recommendation: Heart Healthy Fluid Restriction: None  Hospital Summary: 84 y.o. Caucasian male with medical history significant for osteoarthritis, GERD, DDD, chronic pain syndrome, hypertension, dyslipidemia, multiple myeloma, and OSA on CPAP, who presented to the emergency room with acute onset of mechanical fall.  The patient stated that he lost balance.  He denied any presyncope or syncope.  No head injuries.  He fell on his back with subsequent worsening mid back pain.  He admitted to urinary frequency and urgency without dysuria or hematuria.  No fever or chills.  He admitted to bilateral flank pain.  No cough or wheezing or dyspnea.  No chest pain or palpitations.  No new paresthesias or focal muscle weakness.    Admitted for sepsis secondary to acute cystitis, worsening vertebral fractures, and acute debilitation.  Hospital Course by Problem:  Sepsis due to gram-negative UTI (HCC) Sepsis manifested by tachycardia and tachypnea. Blood cultures not done on admission. Urine cultures with pansensitive E. coli. - Transitioned from IV Rocephin  to bactrim  DS to finish 7 days total antibiotic course   Possible worsening L3 and L4 compression fractures Chronic T12 compression fracture (HCC) Seen by Neurosurgery with no plan for acute intervention as neurologically intact. PT/OT followed during admission  -  cont tylenol  PRN - cont home tramadol  PRN for moderate pain - cont oxycodone  2.5mg  q4h PRN for severe pain - cont senna daily to avoid opioid-induced constipation -  given script for outpatient PT  - given referral to follow up with neurosurgery clinic   Thrombocytopenia Hx of multiple myeloma. Platelets 60 < 53, baseline unclear. Etiology unclear, maybe in the setting of acute infection. LFTs unremarkable. CT A/P with no hepalosplenomegaly. PT/INR only mildly elevated/borderline. D-dimer 2.62, likely elevated due to acute infection. Fibrinogen unremarkable - monitor CBC as needed   Elevated lipase Lipase 534. Etiology unclear. Asymptomatic at present. CT A/P with no concern for acute pancreatitis. - monitor clinically  Discharge Diagnoses:  Principal Problem:   Sepsis due to gram-negative UTI (HCC) Active Problems:   T12 compression fracture (HCC)   GERD without esophagitis   Essential hypertension   Dyslipidemia   Closed compression fracture of L4 lumbar vertebra, initial encounter [facility])   Discharge Instructions  Discharge Instructions     Ambulatory referral to Neurosurgery   Complete by: As directed    Please Select To Department: [facility] for Nerve or Spine  Please select To Department: [facility] AT [place] for Cranial or Neurovascular   Ambulatory referral to Physical Therapy   Complete by: As directed    Increase activity slowly   Complete by: As directed       Allergies as of 08/01/2024       Reactions   Lisinopril Cough        Medication List     STOP taking these medications    acyclovir 200 MG capsule Commonly known as: ZOVIRAX   artificial tears ophthalmic solution   cyclobenzaprine  5 MG  tablet Commonly known as: FLEXERIL    DSS 100 MG Caps   Journavx  50 MG Tabs Generic drug: Suzetrigine    simvastatin 80 MG tablet Commonly known as: ZOCOR       TAKE these medications    acetaminophen  325 MG tablet Commonly  known as: TYLENOL  Take 650 mg by mouth every 6 (six) hours as needed.   amLODipine  5 MG tablet Commonly known as: NORVASC  Take 5 mg by mouth every morning.   ammonium lactate  12 % lotion Commonly known as: LAC-HYDRIN  Apply 1 Application topically as needed for dry skin.   Calcium  Carb-Cholecalciferol  500-2.5 MG-MCG Chew Chew 2 tablets by mouth daily.   cetirizine 10 MG tablet Commonly known as: ZYRTEC Take 10 mg by mouth every morning.   clobetasol  ointment 0.05 % Commonly known as: TEMOVATE  1 application daily as needed (on face and Genitals).   famotidine  20 MG tablet Commonly known as: PEPCID  Take 20 mg by mouth daily.   FLUoxetine  20 MG capsule Commonly known as: PROZAC  Take 20 mg by mouth daily.   gabapentin  300 MG capsule Commonly known as: NEURONTIN  Take by mouth. TAKE ONE CAPSULE BY MOUTH AT BEDTIME FOR NEUROPATHIC PAIN *TAKE ONLY ONE 600MG  TABLET IN THE MORNING!*   hydrocortisone  2.5 % ointment 1 application daily as needed (Scaling).   ixazomib citrate 4 MG capsule Commonly known as: NINLARO Take 4 mg by mouth once a week. Take for 3 weeks. Skip a week. Then restart the 3 weeks.   Jardiance  10 MG Tabs tablet Generic drug: empagliflozin  Take 0.5 tablets by mouth daily.   losartan  25 MG tablet Commonly known as: COZAAR  Take 25 mg by mouth daily.   oxyCODONE  5 MG immediate release tablet Commonly known as: Oxy IR/ROXICODONE  Take 0.5 tablets (2.5 mg total) by mouth every 4 (four) hours as needed for up to 5 days for severe pain (pain score 7-10).   pantoprazole  40 MG tablet Commonly known as: PROTONIX  Take 40 mg by mouth daily.   rosuvastatin  20 MG tablet Commonly known as: CRESTOR  Take 20 mg by mouth daily.   senna 8.6 MG Tabs tablet Commonly known as: SENOKOT Take 1 tablet (8.6 mg total) by mouth daily.   sulfamethoxazole -trimethoprim  800-160 MG tablet Commonly known as: Bactrim  DS Take 1 tablet by mouth 2 (two) times daily for 4 days.    tiZANidine  4 MG tablet Commonly known as: ZANAFLEX  Take 4 mg by mouth as needed.   traMADol  50 MG tablet Commonly known as: ULTRAM  Take 1 tablet (50 mg total) by mouth every 12 (twelve) hours as needed.   triamcinolone  ointment 0.1 % Commonly known as: KENALOG  1 application daily as needed (Dry skin).   urea 20 % cream Commonly known as: CARMOL 1 Application daily at 6 (six) AM.        Allergies[1]  Discharge Exam: Vitals:   08/01/24 0341 08/01/24 0812  BP: 131/73 (!) 108/93  Pulse: 88 (!) 102  Resp: 17 17  Temp: 98.8 F (37.1 C) 98.6 F (37 C)  SpO2: 94% 100%    Physical Exam Vitals and nursing note reviewed.  Constitutional:      General: He is not in acute distress.    Appearance: He is ill-appearing.  HENT:     Head: Normocephalic and atraumatic.  Cardiovascular:     Rate and Rhythm: Normal rate and regular rhythm.     Pulses: Normal pulses.     Heart sounds: Normal heart sounds.  Pulmonary:     Effort: Pulmonary  effort is normal.     Breath sounds: Normal breath sounds.  Abdominal:     General: Bowel sounds are normal.     Palpations: Abdomen is soft.  Neurological:     Mental Status: He is alert.     The results of significant diagnostics from this hospitalization (including imaging, microbiology, ancillary and laboratory) are listed below for reference.    Microbiology: Recent Results (from the past 240 hours)  Urine Culture     Status: Abnormal   Collection Time: 07/29/24  4:20 PM   Specimen: Urine, Clean Catch  Result Value Ref Range Status   Specimen Description   Final    URINE, CLEAN CATCH Performed at [facility], [address] [place], [place] [address]    Special Requests   Final    NONE Performed at [facility], [address] [place], [place] [address]    Culture >=100,000 COLONIES/mL ESCHERICHIA COLI (A)  Final   Report Status 08/01/2024 FINAL  Final   Organism ID, Bacteria ESCHERICHIA COLI (A)   Final      Susceptibility   Escherichia coli - MIC*    AMPICILLIN 4 SENSITIVE Sensitive     CEFAZOLIN  (URINE) Value in next row Sensitive      <=1 SENSITIVEThis is a modified FDA-approved test that has been validated and its performance characteristics determined by the reporting laboratory.  This laboratory is certified under the Clinical Laboratory Improvement Amendments CLIA as qualified to perform high complexity clinical laboratory testing.    CEFEPIME Value in next row Sensitive      <=1 SENSITIVEThis is a modified FDA-approved test that has been validated and its performance characteristics determined by the reporting laboratory.  This laboratory is certified under the Clinical Laboratory Improvement Amendments CLIA as qualified to perform high complexity clinical laboratory testing.    ERTAPENEM Value in next row Sensitive      <=1 SENSITIVEThis is a modified FDA-approved test that has been validated and its performance characteristics determined by the reporting laboratory.  This laboratory is certified under the Clinical Laboratory Improvement Amendments CLIA as qualified to perform high complexity clinical laboratory testing.    CEFTRIAXONE  Value in next row Sensitive      <=1 SENSITIVEThis is a modified FDA-approved test that has been validated and its performance characteristics determined by the reporting laboratory.  This laboratory is certified under the Clinical Laboratory Improvement Amendments CLIA as qualified to perform high complexity clinical laboratory testing.    CIPROFLOXACIN Value in next row Sensitive      <=1 SENSITIVEThis is a modified FDA-approved test that has been validated and its performance characteristics determined by the reporting laboratory.  This laboratory is certified under the Clinical Laboratory Improvement Amendments CLIA as qualified to perform high complexity clinical laboratory testing.    GENTAMICIN Value in next row Sensitive      <=1 SENSITIVEThis is  a modified FDA-approved test that has been validated and its performance characteristics determined by the reporting laboratory.  This laboratory is certified under the Clinical Laboratory Improvement Amendments CLIA as qualified to perform high complexity clinical laboratory testing.    NITROFURANTOIN Value in next row Sensitive      <=1 SENSITIVEThis is a modified FDA-approved test that has been validated and its performance characteristics determined by the reporting laboratory.  This laboratory is certified under the Clinical Laboratory Improvement Amendments CLIA as qualified to perform high complexity clinical laboratory testing.    TRIMETH /SULFA  Value in next row Sensitive      <=  1 SENSITIVEThis is a modified FDA-approved test that has been validated and its performance characteristics determined by the reporting laboratory.  This laboratory is certified under the Clinical Laboratory Improvement Amendments CLIA as qualified to perform high complexity clinical laboratory testing.    AMPICILLIN/SULBACTAM Value in next row Sensitive      <=1 SENSITIVEThis is a modified FDA-approved test that has been validated and its performance characteristics determined by the reporting laboratory.  This laboratory is certified under the Clinical Laboratory Improvement Amendments CLIA as qualified to perform high complexity clinical laboratory testing.    PIP/TAZO Value in next row Sensitive      <=4 SENSITIVEThis is a modified FDA-approved test that has been validated and its performance characteristics determined by the reporting laboratory.  This laboratory is certified under the Clinical Laboratory Improvement Amendments CLIA as qualified to perform high complexity clinical laboratory testing.    MEROPENEM Value in next row Sensitive      <=4 SENSITIVEThis is a modified FDA-approved test that has been validated and its performance characteristics determined by the reporting laboratory.  This laboratory is  certified under the Clinical Laboratory Improvement Amendments CLIA as qualified to perform high complexity clinical laboratory testing.    * >=100,000 COLONIES/mL ESCHERICHIA COLI     Labs: ProBNP, BNP (last 5 results) No results for input(s): PROBNP, BNP in the last 8760 hours. Basic Metabolic Panel: Recent Labs  Lab 07/29/24 1620 07/30/24 0611 07/31/24 0611  NA 138 138 134*  K 4.1 4.6 4.5  CL 104 107 103  CO2 21* 22 21*  GLUCOSE 141* 107* 96  BUN 30* 26* 25*  CREATININE 1.94* 1.80* 1.75*  CALCIUM  10.5* 9.8 9.9   Liver Function Tests: Recent Labs  Lab 07/29/24 1620  AST 49*  ALT 38  ALKPHOS 143*  BILITOT 0.7  PROT 7.7  ALBUMIN 3.8   Recent Labs  Lab 07/29/24 1620  LIPASE 534*   No results for input(s): AMMONIA in the last 168 hours. CBC: Recent Labs  Lab 07/29/24 1620 07/30/24 0611 07/30/24 0841 07/31/24 0611  WBC 4.8 5.3  --  4.3  NEUTROABS  --   --  3.1  --   HGB 14.1 12.6*  --  12.0*  HCT 42.9 37.8*  --  35.9*  MCV 99.1 97.4  --  97.0  PLT 61* 53*  --  60*   Cardiac Enzymes: No results for input(s): CKTOTAL, CKMB, CKMBINDEX, TROPONINI, TROPONINIHS in the last 168 hours. BNP: No results for input(s): BNP in the last 168 hours. CBG: No results for input(s): GLUCAP in the last 168 hours. D-Dimer Recent Labs    07/30/24 0841  DDIMER 2.62*   Hgb A1c No results for input(s): HGBA1C in the last 72 hours. Lipid Profile No results for input(s): CHOL, HDL, LDLCALC, TRIG, CHOLHDL, LDLDIRECT in the last 72 hours. Thyroid function studies No results for input(s): TSH, T4TOTAL, FREET4, T3FREE, THYROIDAB in the last 72 hours.  Invalid input(s): FREET3 Anemia work up No results for input(s): VITAMINB12, FOLATE, FERRITIN, TIBC, IRON, RETICCTPCT in the last 72 hours. Urinalysis    Component Value Date/Time   COLORURINE YELLOW (A) 07/29/2024 1620   APPEARANCEUR TURBID (A) 07/29/2024 1620    APPEARANCEUR Clear 02/23/2024 1442   LABSPEC 1.018 07/29/2024 1620   PHURINE 5.0 07/29/2024 1620   GLUCOSEU >=500 (A) 07/29/2024 1620   HGBUR LARGE (A) 07/29/2024 1620   BILIRUBINUR NEGATIVE 07/29/2024 1620   BILIRUBINUR Negative 02/23/2024 1442   KETONESUR NEGATIVE 07/29/2024 1620  PROTEINUR 100 (A) 07/29/2024 1620   NITRITE NEGATIVE 07/29/2024 1620   LEUKOCYTESUR LARGE (A) 07/29/2024 1620   Sepsis Labs Recent Labs  Lab 07/29/24 1620 07/30/24 0611 07/31/24 0611  WBC 4.8 5.3 4.3    Procedures/Studies: CT ABDOMEN PELVIS WO CONTRAST Result Date: 07/29/2024 EXAM: CT ABDOMEN AND PELVIS WITHOUT CONTRAST 07/29/2024 08:34:16 PM TECHNIQUE: CT of the abdomen and pelvis was performed without the administration of intravenous contrast. Multiplanar reformatted images are provided for review. Automated exposure control, iterative reconstruction, and/or weight-based adjustment of the mA/kV was utilized to reduce the radiation dose to as low as reasonably achievable. COMPARISON: CT chest 01/27/2024. CLINICAL HISTORY: Abdominal trauma, blunt; back pain; possible epigastric pain; possible urinary tract infection. Patient fell about 1 week ago. FINDINGS: LOWER CHEST: Ground glass opacities or atelectasis demonstrated in the lung bases. LIVER: The liver is unremarkable. GALLBLADDER AND BILE DUCTS: Gallbladder is unremarkable. No biliary ductal dilatation. SPLEEN: No acute abnormality. PANCREAS: No acute abnormality. ADRENAL GLANDS: No acute abnormality. KIDNEYS, URETERS AND BLADDER: Bilateral renal cysts. The largest on the left measures 4.2 cm in diameter. Visualized kidneys are unchanged. No imaging follow-up is indicated. Per consensus, no follow-up is needed for simple Bosniak type 1 and 2 renal cysts, unless the patient has a malignancy history or risk factors. No hydronephrosis or hydroureter. No renal or ureteral stones. No perinephric or periureteral stranding. Diffuse bladder wall thickening with  stranding in the pelvic fat and gas in the bladder. This is likely to indicate cystitis with a gas-forming organism. Gas could also have been instilled into the bladder by catheterization if this procedure has been performed. Less likely, this would be due to a fistula. GI AND BOWEL: Mild esophageal hiatal hernia. Dilated distal esophagus may be due to reflux or dysmotility. Stomach demonstrates no acute abnormality. Small bowel and colon are not abnormally distended. No inflammatory infiltration around the bowel. There is no bowel obstruction. PERITONEUM AND RETROPERITONEUM: No ascites. No free air. No free fluid in the abdomen. VASCULATURE: Calcification of the aorta without aneurysm. Aorta is normal in caliber. LYMPH NODES: No significant lymphadenopathy. REPRODUCTIVE ORGANS: Prostate gland is not enlarged. BONES AND SOFT TISSUES: Postoperative changes consistent with mesh hernia repair in the low pelvis. Small periumbilical hernia containing fat. Degenerative changes in the hips. No acute osseous abnormality. No focal soft tissue abnormality. See additional report of CT lumbar spine. IMPRESSION: 1. Diffuse bladder wall thickening with perivesical stranding and intravesical gas, most consistent with cystitis due to a gas-forming organism, with catheter-related intravesical gas as an alternative consideration and fistula less likely. 2. No acute traumatic abdominopelvic injury identified. 3. Mild hiatal hernia with distal esophageal dilatation, which may be due to reflux or dysmotility. Electronically signed by: Elsie Gravely MD 07/29/2024 08:46 PM EST RP Workstation: [id number]   CT L-SPINE NO CHARGE Result Date: 07/29/2024 EXAM: CT OF THE LUMBAR SPINE WITHOUT CONTRAST 07/29/2024 08:34:16 PM TECHNIQUE: CT of the lumbar spine was performed without the administration of intravenous contrast. Multiplanar reformatted images are provided for review. Colon images are reconstructed from a contemporaneous CT abdomen  and pelvis. Automated exposure control, iterative reconstruction, and/or weight based adjustment of the mA/kV was utilized to reduce the radiation dose to as low as reasonably achievable. COMPARISON: CT lumbar spine 02/22/2023 and lumbar spine radiographs 03/02/2023. CLINICAL HISTORY: Low back pain and lower abdominal pain since the fall 1 week ago. FINDINGS: BONES AND ALIGNMENT: Normal alignment of the lumbar spine. Compression of the superior endplate of T12 appears unchanged since  the prior study. Compression of the superior endplate of L3 demonstrates mild progression since prior study. Compression of L4 vertebra has progressed since prior study with about 80% loss of height centrally. This could indicate acute progression of compression fracture in the setting of trauma. No suspicious bone lesion. DEGENERATIVE CHANGES: Degenerative changes in the lumbar spine with narrowed interspaces and endplate osteophyte formation. Degenerative disc disease at multiple levels. Degenerative changes in the facet joints. 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 SOFT TISSUES: No abnormal paraspinal soft tissue mass or infiltration. Paraspinal muscles are symmetrical. See additional report of CT abdomen and pelvis for non lumbar spine findings. IMPRESSION: 1. Progression of L4 vertebral compression fracture with about 80% loss of height centrally, possibly acute in the setting of trauma. 2. Mild progression of L3 superior endplate compression since prior study. 3. Unchanged T12 superior endplate compression since prior study. 4. 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 Electronically signed by: Elsie Gravely MD 07/29/2024 08:40 PM EST RP Workstation: [id number]    DG Thoracic Spine 2 View Result Date: 07/29/2024 EXAM: 2 VIEW(S) XRAY OF THE THORACIC SPINE 07/29/2024 07:33:14 PM COMPARISON: CT examination of 01/27/2024. CLINICAL HISTORY: fall 1 week Fall one week ago. FINDINGS: BONES: Superior endplate fracture of T12 is noted, grossly stable since CT examination of 01/27/2024. No acute fracture or listhesis of the upper and mid thoracic spine. Alignment demonstrates exaggeration of the thoracic kyphosis. DISCS AND DEGENERATIVE CHANGES: Endplate remodeling and disc osteophyte formation throughout the mid and lower thoracic spine is present in keeping with changes of moderate degenerative disc disease. SOFT TISSUES: The visualized lungs are clear. IMPRESSION: 1. No acute fracture or listhesis of the thoracic spine. 2. Chronic superior endplate fracture of T12. 3. Exaggerated thoracic kyphosis. Electronically signed by: Dorethia Molt MD 07/29/2024 08:08 PM EST RP Workstation: [id number]   DG Ribs Unilateral W/Chest Left Result Date: 07/04/2024 CLINICAL DATA:  Pain after trip and fall. EXAM: LEFT RIBS AND CHEST - 3+ VIEW COMPARISON:  None Available. FINDINGS: No evidence of displaced rib fracture. There is no pneumothorax or pleural effusion. Both lungs are clear. Heart size and mediastinal contours are within normal limits. IMPRESSION: No evidence of displaced rib fracture. No pulmonary complication related to fall. Electronically Signed   By: Andrea Gasman M.D.   On: 07/04/2024 19:10   DG Shoulder Left Result Date: 07/04/2024 CLINICAL DATA:  Pain after trip and fall. EXAM: LEFT SHOULDER - 2+ VIEW COMPARISON:  None Available. FINDINGS: Technically limited due to difficulty with positioning. There is no evidence of fracture or dislocation. Small subacromial spur. Soft tissues are unremarkable. IMPRESSION: No fracture or dislocation of the left shoulder. Electronically Signed   By: Andrea Gasman M.D.   On: 07/04/2024 19:09    Time coordinating discharge: 40  mins  SIGNED:  Norval Bar, MD [facility] 08/01/24, 12:41 PM     [1]  Allergies Allergen Reactions   Lisinopril Cough   "

## 2024-08-01 NOTE — Progress Notes (Signed)
 Physical Therapy Treatment Patient Details Name: Peter Webster MRN: [id number] DOB: 03-02-1941 Today's Date: 08/01/2024   History of Present Illness 84 y.o. Caucasian male with medical history significant for osteoarthritis, GERD, DDD, chronic pain syndrome, hypertension, dyslipidemia, multiple myeloma, and OSA on CPAP, who presented to the emergency room with acute onset of mechanical fall. Had Neurosurgery consult who states: some worsening of his L3 and L4 compression fractures noted and a newer T12 compression fracture noted. These do not appear to be unstable fractures and can be treated nonoperatively and without bracing, okay for pain control regimen.    PT Comments  Patient progressing towards physical therapy goals. Requires minA to stand from low recliner surface with cues for hand placement. Ambulated 120' with RW and CGA with cues for close RW proximity. Continues to demonstrate BLE weakness and balance deficits. Wife present and supportive. Discharge plan remains appropriate.     If plan is discharge home, recommend the following: A little help with walking and/or transfers;Help with stairs or ramp for entrance;Assist for transportation   Can travel by private vehicle        Equipment Recommendations  None recommended by PT    Recommendations for Other Services       Precautions / Restrictions Precautions Precautions: Fall Recall of Precautions/Restrictions: Intact Restrictions Weight Bearing Restrictions Per Provider Order: No     Mobility  Bed Mobility               General bed mobility comments: in recliner pre and post session    Transfers Overall transfer level: Needs assistance Equipment used: Rolling [place] Grygiel (2 wheels) Transfers: Sit to/from Stand Sit to Stand: Min assist                Ambulation/Gait Ambulation/Gait assistance: Contact guard assist Gait Distance (Feet): 120 Feet Assistive device: Rolling Naja Apperson (2 wheels) Gait  Pattern/deviations: Step-through pattern, Decreased stride length Gait velocity: decr     General Gait Details: CGA for safety. Cues for close RW proximity   Stairs             Wheelchair Mobility     Tilt Bed    Modified Rankin (Stroke Patients Only)       Balance Overall balance assessment: Needs assistance Sitting-balance support: Feet supported Sitting balance-Leahy Scale: Good     Standing balance support: Bilateral upper extremity supported, Reliant on assistive device for balance, During functional activity Standing balance-Leahy Scale: Fair                              Hotel Manager: No apparent difficulties  Cognition Arousal: Alert Behavior During Therapy: WFL for tasks assessed/performed   PT - Cognitive impairments: No apparent impairments                         Following commands: Intact      Cueing    Exercises      General Comments        Pertinent Vitals/Pain Pain Assessment Pain Assessment: Faces Faces Pain Scale: Hurts even more Pain Location: Left thoracic pain Pain Descriptors / Indicators: Discomfort, Sore, Grimacing, Guarding Pain Intervention(s): Limited activity within patient's tolerance, Monitored during session, Repositioned    Home Living                          Prior Function  PT Goals (current goals can now be found in the care plan section) Acute Rehab PT Goals Patient Stated Goal: return home PT Goal Formulation: With patient/family Time For Goal Achievement: 08/06/24 Potential to Achieve Goals: Good Progress towards PT goals: Progressing toward goals    Frequency    Min 2X/week      PT Plan      Co-evaluation              AM-PAC PT 6 Clicks Mobility   Outcome Measure  Help needed turning from your back to your side while in a flat bed without using bedrails?: A Little Help needed moving from lying on your back to  sitting on the side of a flat bed without using bedrails?: A Little Help needed moving to and from a bed to a chair (including a wheelchair)?: A Little Help needed standing up from a chair using your arms (e.g., wheelchair or bedside chair)?: A Little Help needed to walk in hospital room?: A Little Help needed climbing 3-5 steps with a railing? : A Little 6 Click Score: 18    End of Session   Activity Tolerance: Patient tolerated treatment well Patient left: in chair;with call bell/phone within reach;with family/visitor present Nurse Communication: Mobility status PT Visit Diagnosis: Repeated falls (R29.6);Unsteadiness on feet (R26.81);Pain     Time: 8944-8883 PT Time Calculation (min) (ACUTE ONLY): 21 min  Charges:    $Therapeutic Activity: 8-22 mins PT General Charges $$ ACUTE PT VISIT: 1 Visit                     Maryanne Finder, PT, DPT Physical Therapist - [facility]  [facility]    Cordelle Dahmen A Ath[place] Bolds 08/01/2024, 1:24 PM

## 2024-08-03 LAB — PTT FACTOR INHIBITOR (MIXING STUDY): aPTT: 24.3 s (ref 22.9–30.2)

## 2024-08-08 ENCOUNTER — Other Ambulatory Visit (HOSPITAL_COMMUNITY): Payer: Self-pay

## 2024-09-01 ENCOUNTER — Ambulatory Visit: Admitting: Orthopedic Surgery

## 2024-09-01 ENCOUNTER — Other Ambulatory Visit
